# Patient Record
Sex: Female | Born: 1955 | Race: White | Hispanic: No | Marital: Married | Smoking: Former smoker
Health system: Southern US, Community
[De-identification: ages and names within clinical notes are randomized; demographics above are authoritative.]

## PROBLEM LIST (undated history)

## (undated) DIAGNOSIS — M797 Fibromyalgia: Secondary | ICD-10-CM

## (undated) DIAGNOSIS — D649 Anemia, unspecified: Secondary | ICD-10-CM

## (undated) DIAGNOSIS — K589 Irritable bowel syndrome without diarrhea: Secondary | ICD-10-CM

## (undated) DIAGNOSIS — I739 Peripheral vascular disease, unspecified: Secondary | ICD-10-CM

## (undated) DIAGNOSIS — Z9889 Other specified postprocedural states: Secondary | ICD-10-CM

## (undated) DIAGNOSIS — K529 Noninfective gastroenteritis and colitis, unspecified: Secondary | ICD-10-CM

## (undated) DIAGNOSIS — K5090 Crohn's disease, unspecified, without complications: Secondary | ICD-10-CM

## (undated) DIAGNOSIS — M25559 Pain in unspecified hip: Secondary | ICD-10-CM

## (undated) DIAGNOSIS — R109 Unspecified abdominal pain: Secondary | ICD-10-CM

## (undated) DIAGNOSIS — R195 Other fecal abnormalities: Secondary | ICD-10-CM

## (undated) DIAGNOSIS — E039 Hypothyroidism, unspecified: Secondary | ICD-10-CM

## (undated) DIAGNOSIS — IMO0002 Reserved for concepts with insufficient information to code with codable children: Secondary | ICD-10-CM

## (undated) DIAGNOSIS — M2550 Pain in unspecified joint: Secondary | ICD-10-CM

## (undated) DIAGNOSIS — M549 Dorsalgia, unspecified: Secondary | ICD-10-CM

## (undated) DIAGNOSIS — E119 Type 2 diabetes mellitus without complications: Secondary | ICD-10-CM

## (undated) DIAGNOSIS — E785 Hyperlipidemia, unspecified: Secondary | ICD-10-CM

## (undated) DIAGNOSIS — J449 Chronic obstructive pulmonary disease, unspecified: Secondary | ICD-10-CM

## (undated) HISTORY — DX: Pain in unspecified joint: M25.50

## (undated) HISTORY — DX: Pain in unspecified hip: M25.559

## (undated) HISTORY — DX: Unspecified abdominal pain: R10.9

## (undated) HISTORY — DX: Crohn's disease, unspecified, without complications: K50.90

## (undated) HISTORY — DX: Dorsalgia, unspecified: M54.9

## (undated) HISTORY — DX: Other fecal abnormalities: R19.5

## (undated) HISTORY — DX: Hyperlipidemia, unspecified: E78.5

## (undated) HISTORY — DX: Noninfective gastroenteritis and colitis, unspecified: K52.9

## (undated) HISTORY — DX: Other specified postprocedural states: Z98.890

## (undated) HISTORY — DX: Chronic obstructive pulmonary disease, unspecified: J44.9

## (undated) HISTORY — PX: TONSILLECTOMY: SUR1361

## (undated) HISTORY — DX: Fibromyalgia: M79.7

## (undated) HISTORY — PX: SPINE SURGERY: SHX786

## (undated) HISTORY — DX: Irritable bowel syndrome, unspecified: K58.9

## (undated) HISTORY — DX: Anemia, unspecified: D64.9

## (undated) HISTORY — DX: Reserved for concepts with insufficient information to code with codable children: IMO0002

## (undated) HISTORY — DX: Peripheral vascular disease, unspecified: I73.9

---

## 1991-01-04 HISTORY — PX: CHOLECYSTECTOMY: SHX55

## 2000-07-09 ENCOUNTER — Inpatient Hospital Stay (HOSPITAL_COMMUNITY): Admission: AD | Admit: 2000-07-09 | Discharge: 2000-07-11 | Payer: Self-pay | Admitting: Internal Medicine

## 2005-10-13 ENCOUNTER — Ambulatory Visit: Payer: Self-pay | Admitting: Cardiology

## 2007-08-09 ENCOUNTER — Ambulatory Visit: Payer: Self-pay | Admitting: Cardiology

## 2008-02-27 ENCOUNTER — Emergency Department (HOSPITAL_COMMUNITY): Admission: EM | Admit: 2008-02-27 | Discharge: 2008-02-27 | Payer: Self-pay | Admitting: Emergency Medicine

## 2008-09-25 ENCOUNTER — Inpatient Hospital Stay (HOSPITAL_COMMUNITY): Admission: RE | Admit: 2008-09-25 | Discharge: 2008-09-27 | Payer: Self-pay | Admitting: Neurosurgery

## 2009-04-17 ENCOUNTER — Encounter: Admission: RE | Admit: 2009-04-17 | Discharge: 2009-04-17 | Payer: Self-pay | Admitting: Neurosurgery

## 2009-04-30 ENCOUNTER — Inpatient Hospital Stay (HOSPITAL_COMMUNITY): Admission: RE | Admit: 2009-04-30 | Discharge: 2009-05-05 | Payer: Self-pay | Admitting: Neurosurgery

## 2009-09-18 ENCOUNTER — Encounter: Admission: RE | Admit: 2009-09-18 | Discharge: 2009-09-18 | Payer: Self-pay | Admitting: Neurosurgery

## 2010-03-23 LAB — CBC
HCT: 34.8 % — ABNORMAL LOW (ref 36.0–46.0)
Hemoglobin: 12.4 g/dL (ref 12.0–15.0)
MCHC: 35.5 g/dL (ref 30.0–36.0)
MCV: 90.1 fL (ref 78.0–100.0)
Platelets: 205 10*3/uL (ref 150–400)
RBC: 3.87 MIL/uL (ref 3.87–5.11)
RDW: 14 % (ref 11.5–15.5)
WBC: 8.4 10*3/uL (ref 4.0–10.5)

## 2010-03-23 LAB — TYPE AND SCREEN
ABO/RH(D): A POS
Antibody Screen: NEGATIVE

## 2010-03-23 LAB — SURGICAL PCR SCREEN
MRSA, PCR: NEGATIVE
Staphylococcus aureus: POSITIVE — AB

## 2010-04-09 LAB — CBC
HCT: 34.8 % — ABNORMAL LOW (ref 36.0–46.0)
Hemoglobin: 12 g/dL (ref 12.0–15.0)
MCHC: 34.3 g/dL (ref 30.0–36.0)
MCV: 92.9 fL (ref 78.0–100.0)
Platelets: 172 10*3/uL (ref 150–400)
RBC: 3.75 MIL/uL — ABNORMAL LOW (ref 3.87–5.11)
RDW: 12.7 % (ref 11.5–15.5)
WBC: 5.8 10*3/uL (ref 4.0–10.5)

## 2010-04-09 LAB — TYPE AND SCREEN
ABO/RH(D): A POS
Antibody Screen: NEGATIVE

## 2010-04-09 LAB — ABO/RH: ABO/RH(D): A POS

## 2010-05-21 NOTE — Discharge Summary (Signed)
[place]. [facility]  Patient:    Desiree Hogan, Desiree Hogan                       MRN: [id number] Adm. Date:  [id number] Disc. Date: [id number] Attending:  Nikki Dom Dictator:   Arn Medal, P.A. CC:         Dr. Sherrie Sport, [address], [place],  [address]   Discharge Summary  DISCHARGE DIAGNOSES: 1. Chest pain, status post cardiac catheterization revealing nonobstructive    coronary artery disease. 2. History of seasonal allergies. 3. History of headaches. 4. Hyperlipidemia. 5. Tobacco abuse.  HISTORY OF PRESENT ILLNESS:  Desiree Hogan is a 55 year old female who is admitted to [facility] on July 5, for chest pain and left arm pain.  While there, cardiac enzymes were felt to be elevated and she was transported to [facility] for further evaluation.  She was seen and admitted by Dr. Minus Breeding.  He felt that her chest pain was mixed typical and atypical features.  However, with her risk factors and the fact that the pain occurred at rest and was relieved with nitroglycerin, he felt the best course of action was to take the patient to the catheterization lab the next day.  HOSPITAL COURSE:  On the morning of July 8, the patient was seen by Dr. Wyatt Haste.  The patient admitted some intermittent chest pressure as before. Cardiac enzymes were negative.  D-dimer was also negative at 0.28.  Later that day, she was taken to the catheterization lab by Dr. Cristopher Peru. Catheterization results as follows:  Left main coronary artery was free of disease; left anterior descending coronary artery itself was free of disease, however, there was a 40% proximal narrowing in the first diagonal; circumflex system was free of disease; the right coronary artery was noted to be free of disease, however, during catheterization there was a catheter spasm in the proximal portion of the vessel; left ventricle had ejection fraction of 58% with 1+ mitral  regurgitation.  The next day, the patient was seen by Dr. Lattie Haw.  She denied any further chest pain or shortness of breath.  He felt that the patient was stable for discharge with the addition of Norvasc 5 mg a day for regimen.  Dr. Lattie Haw was also somewhat concerned by the patients use of birth control pills as well as smoking given her age and mild obesity.  This matter was discussed with the patient and she was instructed to follow up with Dr. Sherrie Sport in the near future.  DISCHARGE MEDICATIONS: 1. Zyrtec 10 mg q.d. 2. Unknown birth control pills, previously taken. 3. Norvasc 5 mg q.d.  SPECIAL INSTRUCTIONS:  The patient was advised to avoid driving, heavy lifting or tub baths x 48 hours.  She was advised she may return to work on Thursday, July 11.  She was instructed to watch her catheterization site for any pain, bleeding or swelling and to call the [place] office for any of these problems. She is instructed to stop smoking.  DIET:  Low fat, low cholesterol diet.  FOLLOWUP:  She is instructed to follow up with Dr. Sherrie Sport in the near future.  LABORATORY DATA AND X-RAY FINDINGS:  Sodium 141, potassium 4.0, chloride 103, CO2 29, BUN 6, creatinine 0.9, glucose 99.  Serum pregnancy test was negative. White count 8.0, hemoglobin 12.6, hematocrit 36.6, platelets 210.  Total cholesterol 208, triglycerides 283, HDL 39, LDL 112.  TSH 1.615.  Electrocardiogram revealed normal sinus rhythm at approximately 73.  PR interval is 0.166, QRS 0.080, QTC 0.425, axis 12. DD:  07/11/00 TD:  07/11/00 Job: [id number]

## 2010-05-21 NOTE — Cardiovascular Report (Signed)
[place]. [facility]  Patient:    Desiree, Hogan                       MRN: [id number] Proc. Date: 07/10/00 Adm. Date:  [id number] Attending:  Nikki Dom CC:         Minus Breeding, M.D. [facility]   Cardiac Catheterization  PROCEDURE:  Left heart catheterization with coronary angiography and left ventriculography.  INDICATION:  Recurrent substernal chest pain with borderline positive CKs and typical symptoms.  INTRODUCTION:  The patient is a very pleasant 55 year old woman with a history of tobacco abuse and hypercholesterolemia, who was admitted to the hospital with left arm and chest pain for several weeks.  Her symptoms were initially 8/10, associated with shortness of breath and improved with nitroglycerin. She subsequently had borderline positive cardiac enzymes and is now referred for left heart catheterization.  DESCRIPTION OF PROCEDURE:  After informed consent was obtained, the patient was taken to the diagnostic catheterization lab in the fasting state.  After the usual preparation and draping, intravenous midazolam was given for sedation.  Lidocaine 20 cc was infiltrated into the right groin region and a 7 [place] hemostatic sheath inserted percutaneously into the right femoral artery.  The 6 French Judkins catheter was advanced to the left main coronary artery, and coronary angiography of the left main system was then carried out. The left Judkins catheter was removed and the right Judkins catheter was inserted into the right coronary artery, and coronary angiography of the right coronary system was carried out.  The right Judkins catheter was removed and the pigtail catheter was inserted retrograde across the aortic valve into the left ventricle, and left ventriculography in the RAO projection with a total of 30 cc of contrast delivered at a rate of 12 cc per second was performed. The catheter was then removed, hemostasis assured, and the  patient returned to her room in satisfactory condition.  COMPLICATIONS:  There were no immediate procedure complications.  RESULTS:  A. HEMODYNAMICS:  The LV pressure was 108/14, and the aortic pressure was    115/69.  B. LEFT VENTRICULOGRAPHY:  Left ventriculography performed in the RAO    projection with a total of 30 cc of contrast delivered over 2-1/2 seconds    demonstrated an ejection fraction quantitated at 58%.  There was 1+ mitral    regurgitation.  C. CORONARY ANGIOGRAPHY:  The left main coronary artery gave rise to the LAD    and the left circumflex artery.  The first diagonal branch had a 30-40%    narrowing in the proximal portion.  The second diagonal was a small vessel    but had no obstructive disease.  The mid- and distal LAD had no obstructive    lesions.  The left circumflex gave rise to three small obtuse marginal    branches.  There was no obstructive disease in these small vessels.  The    right coronary artery was a dominant vessel.  After the initial injection,    there was catheter-induced spasm in the proximal portion.  The PDA and mid-    and distal RCA had no obstructive disease.  The posterolateral branches    also had no obstructive disease.  CONCLUSION:  This study demonstrates preserved LV systolic function and normal LV end-diastolic pressures.  It demonstrates nonobstructive coronary disease with diffuse tapering of the obtuse marginal branches and a 40% proximal first diagonal branch.  There was 1%  mitral regurgitation noted. DD:  07/10/00 TD:  07/11/00 Job: [id number]

## 2011-10-27 ENCOUNTER — Other Ambulatory Visit: Payer: Self-pay | Admitting: Neurosurgery

## 2011-10-27 DIAGNOSIS — IMO0002 Reserved for concepts with insufficient information to code with codable children: Secondary | ICD-10-CM

## 2011-10-27 DIAGNOSIS — M47817 Spondylosis without myelopathy or radiculopathy, lumbosacral region: Secondary | ICD-10-CM

## 2011-10-27 DIAGNOSIS — M48061 Spinal stenosis, lumbar region without neurogenic claudication: Secondary | ICD-10-CM

## 2011-11-04 ENCOUNTER — Ambulatory Visit
Admission: RE | Admit: 2011-11-04 | Discharge: 2011-11-04 | Disposition: A | Discharge status: Home / Self Care | Payer: BC Managed Care – PPO | Source: Ambulatory Visit | Attending: Neurosurgery | Admitting: Neurosurgery

## 2011-11-04 DIAGNOSIS — IMO0002 Reserved for concepts with insufficient information to code with codable children: Secondary | ICD-10-CM

## 2011-11-04 DIAGNOSIS — M48061 Spinal stenosis, lumbar region without neurogenic claudication: Secondary | ICD-10-CM

## 2011-11-04 DIAGNOSIS — M47817 Spondylosis without myelopathy or radiculopathy, lumbosacral region: Secondary | ICD-10-CM

## 2012-02-03 ENCOUNTER — Encounter: Payer: Self-pay | Admitting: Internal Medicine

## 2012-02-03 HISTORY — PX: COLONOSCOPY: SHX174

## 2012-03-29 DIAGNOSIS — K5090 Crohn's disease, unspecified, without complications: Secondary | ICD-10-CM

## 2012-03-29 HISTORY — DX: Crohn's disease, unspecified, without complications: K50.90

## 2012-04-02 ENCOUNTER — Encounter: Payer: Self-pay | Admitting: Vascular Surgery

## 2012-04-23 ENCOUNTER — Encounter: Payer: Self-pay | Admitting: Vascular Surgery

## 2012-04-24 ENCOUNTER — Encounter: Payer: Self-pay | Admitting: Vascular Surgery

## 2012-04-24 ENCOUNTER — Ambulatory Visit (INDEPENDENT_AMBULATORY_CARE_PROVIDER_SITE_OTHER): Payer: Medicare Other | Admitting: Vascular Surgery

## 2012-04-24 VITALS — BP 140/73 | HR 105 | Resp 18 | Ht 64.5 in | Wt 202.0 lb

## 2012-04-24 DIAGNOSIS — K551 Chronic vascular disorders of intestine: Secondary | ICD-10-CM

## 2012-04-24 NOTE — Progress Notes (Signed)
[facility] [place]   Patient name: Desiree Hogan MRN: [id number] DOB: 1955-02-06 Sex: female   Referred by: Dr Britta Mccreedy  Reason for referral:  Chief Complaint  Patient presents with  . New Evaluation    C/O Narrowing of SMA  Ref. by Dr. Britta Mccreedy - Pt. has lower back pain, Left Hip, thigh and leg pain, duration 4 years.    HISTORY OF PRESENT ILLNESS: The patient presents today for discussing regarding CT angiogram finding of stenosis. Mesenteric artery. She is a very complex past history. She reports that over the past 9 months three-year she has had diffuse abdominal pain. The presumptive diagnosis is of Crohn's disease. She's had upper and lower endoscopies. There is no biopsy-proven diagnosis of Crohn's. 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 I have this CT for review and discuss it with the patient and her husband present. The patient reports that she has nausea. She reports chronic sores in her mouth. She relates to chronic nausea but it is not hungry. She denies any relationship of her abdominal discomfort with eating meals. She reports this is mostly lower abdominal discomfort. She specifically denies any food fear. She had been losing some weight but has actually gained weight 202 pounds now E. related to her prednisone treatment. She denies any history of coronary artery disease. Does have a history of degenerative disc disease having undergone back surgery 2010 and 2011. She continues to have ongoing pain in her back extending into her legs.  Past Medical History  Diagnosis Date  . Colitis   . Hyperlipidemia   . Hip pain   . Ulcer     Mouth  . Back pain   . Joint pain   . COPD (chronic obstructive pulmonary disease)   . Fibromyalgia   . H/O breast biopsy   . Abdominal pain   . Crohn's disease 03/29/12  . Irritable bowel syndrome   . Nonspecific abnormal  finding in stool contents   . Anemia   . Peripheral vascular disease     Past Surgical History  Procedure Laterality Date  . Colonoscopy  Jan. 31, 2014  . Cesarean section  06/24/82  . Tonsillectomy    . Cholecystectomy  1993    Gall Bladder  . Spine surgery  2010 and 2011    History   Social History  . Marital Status: Married    Spouse Name: N/A    Number of Children: N/A  . Years of Education: N/A   Occupational History  . Not on file.   Social History Main Topics  . Smoking status: Former Smoker    Quit date: 01/03/2009  . Smokeless tobacco: Never Used  . Alcohol Use: No  . Drug Use: No  . Sexually Active: Not on file   Other Topics Concern  . Not on file   Social History Narrative  . No narrative on file    Family History  Problem Relation Age of Onset  . Cancer Mother     Colon or vaginal ?  . Deep vein thrombosis Mother   . Hypertension Father   . Diabetes Father   . Heart disease Father     Heart Disease before age 32  . Diabetes Sister   . Diabetes Brother   . Hypertension Brother     Allergies as of 04/24/2012 - Review Complete 04/24/2012  Allergen Reaction Noted  .  Bactrim (sulfamethoxazole w-trimethoprim) Itching 04/02/2012  . Penicillins Anaphylaxis 04/02/2012  . Tetracyclines & related Swelling and Rash 04/24/2012    Current Outpatient Prescriptions on File Prior to Visit  Medication Sig Dispense Refill  . alendronate (FOSAMAX) 70 MG tablet Take 70 mg by mouth every 7 (seven) days. Take with a full glass of water on an empty stomach.      . calcium gluconate 500 MG tablet Take 600 mg by mouth daily.       . cetirizine (ZYRTEC) 10 MG tablet Take 10 mg by mouth daily.      . cyclobenzaprine (FLEXERIL) 10 MG tablet Take 10 mg by mouth 3 (three) times daily as needed for muscle spasms.      . DULoxetine (CYMBALTA) 60 MG capsule Take 60 mg by mouth daily.      Marland Kitchen gabapentin (NEURONTIN) 300 MG capsule Take 300 mg by mouth 3 (three) times  daily.      Marland Kitchen gemfibrozil (LOPID) 600 MG tablet Take 600 mg by mouth 2 (two) times daily before a meal.      . levothyroxine (SYNTHROID, LEVOTHROID) 137 MCG tablet Take 137 mcg by mouth daily.      . Mesalamine (ASACOL HD) 800 MG TBEC Take by mouth.      . predniSONE (DELTASONE) 10 MG tablet Take 10 mg by mouth daily.      . traMADol (ULTRAM) 50 MG tablet Take 50 mg by mouth every 6 (six) hours as needed for pain.      Marland Kitchen PSEUDOEPH-HYDROCODONE PO Take 3-15 mg by mouth 4 (four) times daily.       No current facility-administered medications on file prior to visit.     REVIEW OF SYSTEMS:  Positives indicated with an "X"  CARDIOVASCULAR:  [ ]  chest pain   [ ]  chest pressure   [ ]  palpitations   [ ]  orthopnea   [ ]  dyspnea on exertion   [ ]  claudication   [ ]  rest pain   [ ]  DVT   [ ]  phlebitis PULMONARY:   [ ]  productive cough   [ ]  asthma   [ ]  wheezing NEUROLOGIC:   [ ]  weakness  [ ]  paresthesias  [ ]  aphasia  [ ]  amaurosis  [ ]  dizziness HEMATOLOGIC:   [ ]  bleeding problems   [ ]  clotting disorders MUSCULOSKELETAL:  [ ]  joint pain   [ ]  joint swelling GASTROINTESTINAL: [ ]   blood in stool  [ ]   hematemesis GENITOURINARY:  [ ]   dysuria  [ ]   hematuria PSYCHIATRIC:  [ ]  history of major depression INTEGUMENTARY:  [ ]  rashes  [ ]  ulcers CONSTITUTIONAL:  [ ]  fever   [ ]  chills  PHYSICAL EXAMINATION:  General: The patient is a well-nourished female, in no acute distress. Vital signs are BP 140/73  Pulse 105  Resp 18  Ht 5' 4.5" (1.638 m)  Wt 202 lb (91.627 kg)  BMI 34.15 kg/m2  SpO2 96% Pulmonary: There is a good air exchange bilaterally without wheezing or rales. Abdomen: Soft and non-tender with normal pitch bowel sounds. No bruit is detected Musculoskeletal: There are no major deformities.  There is no significant extremity pain. Neurologic: No focal weakness or paresthesias are detected, Skin: There are no ulcer or rashes noted. Psychiatric: The patient has normal  affect. Cardiovascular: There is a regular rate and rhythm without significant murmur appreciated. Pulse status 2+ radial pulses 2+ dorsalis pedis pulses  CT scan from [facility]  was reviewed. Also reviewed her films from CT scan in 2010. The 2010 films showed no evidence of narrowing of her superior mesenteric artery. 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 Had a very long discussion with the patient and her husband present. I explained that it is impossible to determine if this is causing any of her symptoms. I think is very well may be an incidental finding of asymptomatic. I explained that she does have good collateralization from the celiac and inferior mesenteric artery. It is interesting that she did not have any stenosis in 2010 and has a significant stenosis 4 years later and her 2014 CT scan. She does not have any other evidence of atherosclerotic disease. I explained that typically the single-vessel stenosis would not cause symptoms of mesenteric ischemia. Also of note she specifically denies to be any relationship with food intake which would be an usual if this was pain related to mesenteric ischemia which is typically postprandial. I do feel that she would be acceptable anatomic candidate for stenting if treatment was recommended. I feel this is unlikely that this is causing her symptoms explain that the only way to know would be to treat this was sent MCG and symptom improvement. I feel this would be unlikely. The patient will continue her discussion with Dr. Britta Mccreedy. If no other treatments are effective would consider stenting of her stricture mesenteric artery.    Steen Bisig [facility] [place] Office: [phone]

## 2015-02-02 DIAGNOSIS — E119 Type 2 diabetes mellitus without complications: Secondary | ICD-10-CM | POA: Diagnosis not present

## 2015-02-02 DIAGNOSIS — M545 Low back pain: Secondary | ICD-10-CM | POA: Diagnosis not present

## 2015-05-08 DIAGNOSIS — J069 Acute upper respiratory infection, unspecified: Secondary | ICD-10-CM | POA: Diagnosis not present

## 2015-05-08 DIAGNOSIS — E119 Type 2 diabetes mellitus without complications: Secondary | ICD-10-CM | POA: Diagnosis not present

## 2015-06-23 DIAGNOSIS — M79642 Pain in left hand: Secondary | ICD-10-CM | POA: Diagnosis not present

## 2015-06-23 DIAGNOSIS — R5381 Other malaise: Secondary | ICD-10-CM | POA: Diagnosis not present

## 2015-06-23 DIAGNOSIS — M174 Other bilateral secondary osteoarthritis of knee: Secondary | ICD-10-CM | POA: Diagnosis not present

## 2015-06-23 DIAGNOSIS — M797 Fibromyalgia: Secondary | ICD-10-CM | POA: Diagnosis not present

## 2015-06-30 DIAGNOSIS — M79642 Pain in left hand: Secondary | ICD-10-CM | POA: Diagnosis not present

## 2015-06-30 DIAGNOSIS — M5137 Other intervertebral disc degeneration, lumbosacral region: Secondary | ICD-10-CM | POA: Diagnosis not present

## 2015-06-30 DIAGNOSIS — G4709 Other insomnia: Secondary | ICD-10-CM | POA: Diagnosis not present

## 2015-06-30 DIAGNOSIS — M797 Fibromyalgia: Secondary | ICD-10-CM | POA: Diagnosis not present

## 2015-07-13 DIAGNOSIS — Z5181 Encounter for therapeutic drug level monitoring: Secondary | ICD-10-CM | POA: Diagnosis not present

## 2015-10-30 DIAGNOSIS — J329 Chronic sinusitis, unspecified: Secondary | ICD-10-CM | POA: Diagnosis not present

## 2015-10-30 DIAGNOSIS — Z1389 Encounter for screening for other disorder: Secondary | ICD-10-CM | POA: Diagnosis not present

## 2015-10-30 DIAGNOSIS — R5383 Other fatigue: Secondary | ICD-10-CM | POA: Diagnosis not present

## 2015-10-30 DIAGNOSIS — Z0000 Encounter for general adult medical examination without abnormal findings: Secondary | ICD-10-CM | POA: Diagnosis not present

## 2015-10-30 DIAGNOSIS — Z299 Encounter for prophylactic measures, unspecified: Secondary | ICD-10-CM | POA: Diagnosis not present

## 2015-10-30 DIAGNOSIS — Z6834 Body mass index (BMI) 34.0-34.9, adult: Secondary | ICD-10-CM | POA: Diagnosis not present

## 2015-10-30 DIAGNOSIS — E1165 Type 2 diabetes mellitus with hyperglycemia: Secondary | ICD-10-CM | POA: Diagnosis not present

## 2015-10-30 DIAGNOSIS — Z1211 Encounter for screening for malignant neoplasm of colon: Secondary | ICD-10-CM | POA: Diagnosis not present

## 2015-11-03 DIAGNOSIS — R5383 Other fatigue: Secondary | ICD-10-CM | POA: Diagnosis not present

## 2015-11-03 DIAGNOSIS — Z0000 Encounter for general adult medical examination without abnormal findings: Secondary | ICD-10-CM | POA: Diagnosis not present

## 2015-11-03 DIAGNOSIS — D509 Iron deficiency anemia, unspecified: Secondary | ICD-10-CM | POA: Diagnosis not present

## 2015-11-03 DIAGNOSIS — Z79899 Other long term (current) drug therapy: Secondary | ICD-10-CM | POA: Diagnosis not present

## 2015-12-09 NOTE — Progress Notes (Signed)
Office Visit Note  Patient: Desiree Hogan             Date of Birth: 11/20/1955           MRN: [id number]             PCP: Glenda Chroman, MD Referring: Glenda Chroman, MD Visit Date: 12/11/2015 Occupation: @GUAROCC @    Subjective:  No chief complaint on file. Follow-up on fibromyalgia, insomnia, fatigue.  History of Present Illness: Desiree Hogan is a 61 y.o. female  Last seen 06/30/2015. Patient rates her fiber myalgia discomfort as 78 on a scale of 0-10. Her fatigue is rated 8 on a scale of 0-10 Patient uses tramadol for pain management. Her urine drug screen was negative as of July 2017  She usually sees a back doctor who is been prescribing her Flexeril and gabapentin. She is no longer seeing them and wants [place] to take over. I offered her that I would be happy to give her a muscle relaxer but it won't be Flexeril, it will be Zanaflex instead since she is on tramadol so there is no interaction. She understands and she is agreeable with Zanaflex. She will have to get the gabapentin from her PCP and she is agreeable.    Activities of Daily Living:  Patient reports morning stiffness for 30 minutes.   Patient Reports nocturnal pain.  Difficulty dressing/grooming: Denies Difficulty climbing stairs: Denies Difficulty getting out of chair: Denies Difficulty using hands for taps, buttons, cutlery, and/or writing: Reports   No Rheumatology ROS completed.   PMFS History:  There are no active problems to display for this patient.   Past Medical History:  Diagnosis Date  . Abdominal pain   . Anemia   . Back pain   . Colitis   . COPD (chronic obstructive pulmonary disease) ([place])   . Crohn's disease ([place]) 03/29/12  . Fibromyalgia   . H/O breast biopsy   . Hip pain   . Hyperlipidemia   . Irritable bowel syndrome   . Joint pain   . Nonspecific abnormal finding in stool contents   . Peripheral vascular disease ([place])   . Ulcer ([place])    Mouth    Family History  Problem  Relation Age of Onset  . Cancer Mother     Colon or vaginal ?  . Deep vein thrombosis Mother   . Hypertension Father   . Diabetes Father   . Heart disease Father     Heart Disease before age 40  . Diabetes Sister   . Diabetes Brother   . Hypertension Brother    Past Surgical History:  Procedure Laterality Date  . CESAREAN SECTION  06/24/82  . CHOLECYSTECTOMY  1993   Gall Bladder  . COLONOSCOPY  Jan. 31, 2014  . [place] SURGERY  2010 and 2011  . TONSILLECTOMY     Social History   Social History Narrative  . No narrative on file     Objective: Vital Signs: BP (!) 142/75 (BP Location: Left Arm, Patient Position: Sitting, Cuff Size: Normal)   Pulse 88   Resp 14   Ht 5' 3"  (1.6 m)   Wt 190 lb (86.2 kg)   BMI 33.66 kg/m    Physical Exam   Musculoskeletal Exam:  Full range of motion of all joints Grip strength is equal and strong bilaterally 18 out of 18 fibromyalgia tender points  CDAI Exam: No CDAI exam completed.  No synovitis on examination  Investigation:  Findings:  UDS 07/13/15 consistent with tramadol use narcotic agreement on file Dec 2016     Imaging: No results found.  Speciality Comments: No specialty comments available.    Procedures:  Large Joint Inj Date/Time: 12/11/2015 11:18 AM Performed by: Eliezer Lofts Authorized by: Eliezer Lofts   Consent Given by:  Patient Site marked: the procedure site was marked   Timeout: prior to procedure the correct patient, procedure, and site was verified   Indications:  Pain Location:  Hip Site:  R hip joint Prep: patient was prepped and draped in usual sterile fashion   Needle Size:  27 G Approach:  Superior Ultrasound Guidance: No   Fluoroscopic Guidance: No   Arthrogram: No   Medications:  1.5 mL lidocaine 1 %; 40 mg triamcinolone acetonide 40 MG/ML Aspiration Attempted: Yes   Aspirate amount (mL):  0 Patient tolerance:  Patient tolerated the procedure well with no immediate complications   About 27-06% improvement a few minutes after the injection in each hip Large Joint Inj Date/Time: 12/11/2015 11:28 AM Performed by: Eliezer Lofts Authorized by: Eliezer Lofts   Consent Given by:  Patient Site marked: the procedure site was marked   Timeout: prior to procedure the correct patient, procedure, and site was verified   Indications:  Pain Location:  Hip Site:  L greater trochanter Prep: patient was prepped and draped in usual sterile fashion   Needle Size:  27 G Approach:  Superior Ultrasound Guidance: No   Fluoroscopic Guidance: No   Arthrogram: No   Medications:  1.5 mL lidocaine 1 %; 40 mg triamcinolone acetonide 40 MG/ML Aspiration Attempted: Yes   Aspirate amount (mL):  0 Patient tolerance:  Patient tolerated the procedure well with no immediate complications  About 23-76% improvement a few minutes after the injection in each hip   Allergies: Bactrim [sulfamethoxazole-trimethoprim]; Penicillins; and Tetracyclines & related   Assessment / Plan:     Visit Diagnoses: Fibromyalgia  Greater trochanteric bursitis of both hips - Plan: Large Joint Injection/Arthrocentesis, Large Joint Injection/Arthrocentesis  Chronic fatigue  DDD lumbar   Primary insomnia  Primary osteoarthritis of both knees  Primary osteoarthritis of both hands   Fibromyalgia is about 7-8 on a scale of 0-10 Fatigue is about 8 on a scale of 0-10  Chronic pain, UDS 07/2015 Narcotic agreement 12/2014 Patient is not seeing her back doctor anymore and as a result she wants [place] to start giving her the medications at the back doctor used to give her. I've asked the patient if it is okay for [place] to continue only the Flexeril part of that and let her family physician Joya Gaskins for the gabapentin. Patient is agreeable. And since the patient is actually on tramadol, Flexeril and tramadol will interact with each other and therefore Zanaflex would be a better option and we've asked the patient for if it is  okay to switch her to Zanaflex and patient is agreeable.  I will refill Cymbalta, tramadol, Zanaflex; and she will get the Neurontin from her family physician  Her narcotic agreement is up-to-date and will not be due till about July 2018  Patient is about 25-35% improved after getting cortisone injection in bilateral hips  Return to clinic in 5 months  Urine drug screen is negative as of July 2017 except for positive for tramadol consistent with patient's usage of tramadol.  Orders: Orders Placed This Encounter  Procedures  . Large Joint Injection/Arthrocentesis  . Large Joint Injection/Arthrocentesis   No orders of the defined types  were placed in this encounter.   Face-to-face time spent with patient was 30 minutes. 50% of time was spent in counseling and coordination of care.  Follow-Up Instructions: Return in about 6 months (around 06/10/2016) for [place].   Eliezer Lofts, PA-C   I examined and evaluated the patient with Eliezer Lofts PA. The plan of care was discussed as noted above.  Bo Merino, MD

## 2015-12-11 ENCOUNTER — Ambulatory Visit: Payer: Self-pay | Admitting: Rheumatology

## 2015-12-11 ENCOUNTER — Encounter: Payer: Self-pay | Admitting: Rheumatology

## 2015-12-11 ENCOUNTER — Ambulatory Visit (INDEPENDENT_AMBULATORY_CARE_PROVIDER_SITE_OTHER): Payer: No Typology Code available for payment source | Admitting: Rheumatology

## 2015-12-11 ENCOUNTER — Other Ambulatory Visit: Payer: Self-pay | Admitting: *Deleted

## 2015-12-11 VITALS — BP 142/75 | HR 88 | Resp 14 | Ht 63.0 in | Wt 190.0 lb

## 2015-12-11 DIAGNOSIS — M7062 Trochanteric bursitis, left hip: Secondary | ICD-10-CM

## 2015-12-11 DIAGNOSIS — M47816 Spondylosis without myelopathy or radiculopathy, lumbar region: Secondary | ICD-10-CM

## 2015-12-11 DIAGNOSIS — M7061 Trochanteric bursitis, right hip: Secondary | ICD-10-CM | POA: Diagnosis not present

## 2015-12-11 DIAGNOSIS — R5382 Chronic fatigue, unspecified: Secondary | ICD-10-CM

## 2015-12-11 DIAGNOSIS — M19041 Primary osteoarthritis, right hand: Secondary | ICD-10-CM

## 2015-12-11 DIAGNOSIS — F5101 Primary insomnia: Secondary | ICD-10-CM | POA: Diagnosis not present

## 2015-12-11 DIAGNOSIS — M797 Fibromyalgia: Secondary | ICD-10-CM

## 2015-12-11 DIAGNOSIS — M19042 Primary osteoarthritis, left hand: Secondary | ICD-10-CM

## 2015-12-11 DIAGNOSIS — M170 Bilateral primary osteoarthritis of knee: Secondary | ICD-10-CM | POA: Diagnosis not present

## 2015-12-11 MED ORDER — DULOXETINE HCL 60 MG PO CPEP: 60.0000 mg | ORAL_CAPSULE | Freq: Every day | ORAL | 1 refills | Status: DC

## 2015-12-11 MED ORDER — TIZANIDINE HCL 4 MG PO TABS: 4.0000 mg | ORAL_TABLET | Freq: Every day | ORAL | 0 refills | Status: DC

## 2015-12-11 MED ORDER — TRIAMCINOLONE ACETONIDE 40 MG/ML IJ SUSP
40.0000 mg | INTRAMUSCULAR | Status: AC | PRN
  Administered 2015-12-11: 40 mg via INTRA_ARTICULAR

## 2015-12-11 MED ORDER — TRAMADOL HCL 50 MG PO TABS: 50.0000 mg | ORAL_TABLET | Freq: Four times a day (QID) | ORAL | 2 refills | Status: DC | PRN

## 2015-12-11 MED ORDER — LIDOCAINE HCL 1 % IJ SOLN
1.5000 mL | INTRAMUSCULAR | Status: AC | PRN
  Administered 2015-12-11: 1.5 mL

## 2016-01-04 ENCOUNTER — Other Ambulatory Visit: Payer: Self-pay | Admitting: Rheumatology

## 2016-01-05 NOTE — Telephone Encounter (Signed)
Okay 

## 2016-01-05 NOTE — Telephone Encounter (Signed)
UDS 07/16/15 Last visit 12/11/15 Next visit 06/10/16 12/26/14 narcotic agreement (mailed to patient to update) Ok to refill Tramadol ?

## 2016-01-08 ENCOUNTER — Other Ambulatory Visit: Payer: Self-pay | Admitting: *Deleted

## 2016-01-08 ENCOUNTER — Other Ambulatory Visit: Payer: Self-pay | Admitting: Rheumatology

## 2016-01-08 NOTE — Telephone Encounter (Signed)
Last Visit: 12/11/15 Next Visit: 06/10/16 UDS: 07/16/15 Narc Agreement: 12/26/14  Will call the patient for her to update her Narcotic agreement.   Okay to refill Tramadol?

## 2016-01-08 NOTE — Telephone Encounter (Signed)
Last refill was September 2017. She takes 1 tablet at noon and 2 tablets at night prn, Dispense 30 days supply (90 pills) with 2 refills

## 2016-01-08 NOTE — Telephone Encounter (Signed)
As you already mentioned, patient needs an updated narcotic agreement.Okay to refill tramadol

## 2016-02-01 ENCOUNTER — Other Ambulatory Visit: Payer: Self-pay | Admitting: Rheumatology

## 2016-02-01 NOTE — Telephone Encounter (Signed)
Patient needs a refill of Tramadol sent to [facility] in [place].

## 2016-02-01 NOTE — Telephone Encounter (Signed)
Last Visit: 12/11/15 Next Visit: 06/10/16 UDS: 07/16/15 Narc Agreement: 12/26/14  Okay to refill Tramadol?

## 2016-02-01 NOTE — Telephone Encounter (Signed)
According to patient's chart there was a prescription for Tramadol sent to the patient's pharmacy on 01/08/16. Patient to contact her pharmacy to see if they have it on file. Will call back if she has any problems.

## 2016-02-01 NOTE — Telephone Encounter (Signed)
Attempted to contact the patient and left message for patient to call the office.  

## 2016-02-09 DIAGNOSIS — M810 Age-related osteoporosis without current pathological fracture: Secondary | ICD-10-CM | POA: Diagnosis not present

## 2016-02-09 DIAGNOSIS — K5090 Crohn's disease, unspecified, without complications: Secondary | ICD-10-CM | POA: Diagnosis not present

## 2016-02-09 DIAGNOSIS — Z87891 Personal history of nicotine dependence: Secondary | ICD-10-CM | POA: Diagnosis not present

## 2016-02-09 DIAGNOSIS — Z713 Dietary counseling and surveillance: Secondary | ICD-10-CM | POA: Diagnosis not present

## 2016-02-09 DIAGNOSIS — Z299 Encounter for prophylactic measures, unspecified: Secondary | ICD-10-CM | POA: Diagnosis not present

## 2016-02-09 DIAGNOSIS — D509 Iron deficiency anemia, unspecified: Secondary | ICD-10-CM | POA: Diagnosis not present

## 2016-02-09 DIAGNOSIS — E7800 Pure hypercholesterolemia, unspecified: Secondary | ICD-10-CM | POA: Diagnosis not present

## 2016-02-09 DIAGNOSIS — J069 Acute upper respiratory infection, unspecified: Secondary | ICD-10-CM | POA: Diagnosis not present

## 2016-02-09 DIAGNOSIS — E1165 Type 2 diabetes mellitus with hyperglycemia: Secondary | ICD-10-CM | POA: Diagnosis not present

## 2016-02-09 DIAGNOSIS — E039 Hypothyroidism, unspecified: Secondary | ICD-10-CM | POA: Diagnosis not present

## 2016-02-24 ENCOUNTER — Other Ambulatory Visit: Payer: Self-pay | Admitting: Rheumatology

## 2016-02-24 NOTE — Telephone Encounter (Signed)
Patient is requesting refill of tramadol be sent to [facility] in [place].

## 2016-02-25 MED ORDER — TRAMADOL HCL 50 MG PO TABS: 50.0000 mg | ORAL_TABLET | Freq: Four times a day (QID) | ORAL | 0 refills | Status: DC | PRN

## 2016-02-25 NOTE — Telephone Encounter (Signed)
Left message to advise patient prescription has been sent to the pharmacy.

## 2016-02-25 NOTE — Telephone Encounter (Signed)
Last Visit: 12/11/15 Next Visit: 06/10/16 Narc Agreement: 01/12/16 UDS: 07/16/15  Okay to refill Tramadol?

## 2016-02-25 NOTE — Telephone Encounter (Signed)
Patient called and is wanting to know if her tramedol was sent to the pharmacy.  [phone]

## 2016-02-29 DIAGNOSIS — N182 Chronic kidney disease, stage 2 (mild): Secondary | ICD-10-CM | POA: Diagnosis not present

## 2016-02-29 DIAGNOSIS — D509 Iron deficiency anemia, unspecified: Secondary | ICD-10-CM | POA: Diagnosis not present

## 2016-03-06 ENCOUNTER — Other Ambulatory Visit: Payer: Self-pay | Admitting: Rheumatology

## 2016-03-07 DIAGNOSIS — N182 Chronic kidney disease, stage 2 (mild): Secondary | ICD-10-CM | POA: Diagnosis not present

## 2016-03-07 DIAGNOSIS — D509 Iron deficiency anemia, unspecified: Secondary | ICD-10-CM | POA: Diagnosis not present

## 2016-03-07 NOTE — Telephone Encounter (Signed)
Last Visit: 12/11/15 Next Visit: 06/10/16  Okay to refill Tizanidine?

## 2016-03-24 ENCOUNTER — Other Ambulatory Visit: Payer: Self-pay | Admitting: Rheumatology

## 2016-03-24 MED ORDER — TRAMADOL HCL 50 MG PO TABS: 50.0000 mg | ORAL_TABLET | Freq: Four times a day (QID) | ORAL | 0 refills | Status: DC | PRN

## 2016-03-24 NOTE — Telephone Encounter (Signed)
Patient is requesting refill of tramadol to be [facility] [place].

## 2016-03-24 NOTE — Telephone Encounter (Signed)
Last Visit: 12/11/15 Next Visit: 06/10/16 Narc Agreement: 01/12/16 UDS: 07/16/15  Okay to refill Tramadol?

## 2016-04-19 ENCOUNTER — Other Ambulatory Visit: Payer: Self-pay | Admitting: Rheumatology

## 2016-04-19 MED ORDER — TRAMADOL HCL 50 MG PO TABS: 50.0000 mg | ORAL_TABLET | Freq: Four times a day (QID) | ORAL | 0 refills | Status: DC | PRN

## 2016-04-19 NOTE — Telephone Encounter (Signed)
Last Visit: 12/11/15 Next Visit: 06/10/16 Narc Agreement: 01/12/16 UDS: 07/16/15  Okay to refill Tramadol?

## 2016-04-19 NOTE — Telephone Encounter (Signed)
Patient called requesting refill on her Tramadol.  [facility] [place]  CB#[phone].  Thank you.

## 2016-05-13 ENCOUNTER — Other Ambulatory Visit: Payer: Self-pay | Admitting: Rheumatology

## 2016-05-13 DIAGNOSIS — Z6833 Body mass index (BMI) 33.0-33.9, adult: Secondary | ICD-10-CM | POA: Diagnosis not present

## 2016-05-13 DIAGNOSIS — E039 Hypothyroidism, unspecified: Secondary | ICD-10-CM | POA: Diagnosis not present

## 2016-05-13 DIAGNOSIS — K5090 Crohn's disease, unspecified, without complications: Secondary | ICD-10-CM | POA: Diagnosis not present

## 2016-05-13 DIAGNOSIS — Z299 Encounter for prophylactic measures, unspecified: Secondary | ICD-10-CM | POA: Diagnosis not present

## 2016-05-13 DIAGNOSIS — M810 Age-related osteoporosis without current pathological fracture: Secondary | ICD-10-CM | POA: Diagnosis not present

## 2016-05-13 DIAGNOSIS — E7800 Pure hypercholesterolemia, unspecified: Secondary | ICD-10-CM | POA: Diagnosis not present

## 2016-05-13 DIAGNOSIS — E1165 Type 2 diabetes mellitus with hyperglycemia: Secondary | ICD-10-CM | POA: Diagnosis not present

## 2016-05-13 DIAGNOSIS — Z713 Dietary counseling and surveillance: Secondary | ICD-10-CM | POA: Diagnosis not present

## 2016-05-13 MED ORDER — TRAMADOL HCL 50 MG PO TABS: 50.0000 mg | ORAL_TABLET | Freq: Four times a day (QID) | ORAL | 0 refills | Status: DC | PRN

## 2016-05-13 NOTE — Telephone Encounter (Signed)
Last Visit: 12/11/15 Next Visit: 06/10/16 Narc Agreement: 01/12/16 UDS: 07/16/15  Okay to refill Tramadol?

## 2016-05-13 NOTE — Telephone Encounter (Signed)
Patient is requesting ref[facility] of [facility] [place].

## 2016-05-19 DIAGNOSIS — J45909 Unspecified asthma, uncomplicated: Secondary | ICD-10-CM | POA: Diagnosis not present

## 2016-05-19 DIAGNOSIS — J309 Allergic rhinitis, unspecified: Secondary | ICD-10-CM | POA: Diagnosis not present

## 2016-06-01 ENCOUNTER — Other Ambulatory Visit: Payer: Self-pay | Admitting: Rheumatology

## 2016-06-02 NOTE — Telephone Encounter (Signed)
Last Visit: 12/10/16 Next Visit: 06/10/16  Okay to refill Tizanidine?

## 2016-06-06 ENCOUNTER — Other Ambulatory Visit: Payer: Self-pay | Admitting: Rheumatology

## 2016-06-06 MED ORDER — TRAMADOL HCL 50 MG PO TABS
50.0000 mg | ORAL_TABLET | Freq: Four times a day (QID) | ORAL | 0 refills | Status: DC | PRN
Start: 2016-06-06 — End: 2016-06-30

## 2016-06-06 NOTE — Telephone Encounter (Signed)
Last Visit: 12/11/15 Next Visit: 06/10/16 Narc Agreement: 01/12/16 UDS: 07/16/15  Okay to refill Tramadol?

## 2016-06-06 NOTE — Telephone Encounter (Signed)
Patient left a message requesting a refill on her Tramadol.  [facility] [place].  CB#[phone]

## 2016-06-10 ENCOUNTER — Ambulatory Visit: Payer: No Typology Code available for payment source | Admitting: Rheumatology

## 2016-06-10 ENCOUNTER — Encounter: Payer: Self-pay | Admitting: Rheumatology

## 2016-06-10 ENCOUNTER — Ambulatory Visit (INDEPENDENT_AMBULATORY_CARE_PROVIDER_SITE_OTHER): Payer: No Typology Code available for payment source | Admitting: Rheumatology

## 2016-06-10 VITALS — BP 141/69 | HR 85 | Resp 15 | Ht 63.5 in | Wt 190.0 lb

## 2016-06-10 DIAGNOSIS — R5382 Chronic fatigue, unspecified: Secondary | ICD-10-CM | POA: Diagnosis not present

## 2016-06-10 DIAGNOSIS — M797 Fibromyalgia: Secondary | ICD-10-CM

## 2016-06-10 DIAGNOSIS — Z5181 Encounter for therapeutic drug level monitoring: Secondary | ICD-10-CM | POA: Diagnosis not present

## 2016-06-10 DIAGNOSIS — M7061 Trochanteric bursitis, right hip: Secondary | ICD-10-CM | POA: Diagnosis not present

## 2016-06-10 DIAGNOSIS — F5101 Primary insomnia: Secondary | ICD-10-CM | POA: Diagnosis not present

## 2016-06-10 DIAGNOSIS — M62838 Other muscle spasm: Secondary | ICD-10-CM | POA: Diagnosis not present

## 2016-06-10 DIAGNOSIS — M461 Sacroiliitis, not elsewhere classified: Secondary | ICD-10-CM

## 2016-06-10 DIAGNOSIS — M7062 Trochanteric bursitis, left hip: Secondary | ICD-10-CM | POA: Diagnosis not present

## 2016-06-10 DIAGNOSIS — M47818 Spondylosis without myelopathy or radiculopathy, sacral and sacrococcygeal region: Secondary | ICD-10-CM | POA: Diagnosis not present

## 2016-06-10 LAB — CBC WITH DIFFERENTIAL/PLATELET
BASOS ABS: 0 {cells}/uL (ref 0–200)
Basophils Relative: 0 %
EOS ABS: 228 {cells}/uL (ref 15–500)
Eosinophils Relative: 3 %
HEMATOCRIT: 38.5 % (ref 35.0–45.0)
Hemoglobin: 12.6 g/dL (ref 11.7–15.5)
LYMPHS PCT: 28 %
Lymphs Abs: 2128 cells/uL (ref 850–3900)
MCH: 31 pg (ref 27.0–33.0)
MCHC: 32.7 g/dL (ref 32.0–36.0)
MCV: 94.6 fL (ref 80.0–100.0)
MONO ABS: 684 {cells}/uL (ref 200–950)
MONOS PCT: 9 %
MPV: 11.7 fL (ref 7.5–12.5)
Neutro Abs: 4560 cells/uL (ref 1500–7800)
Neutrophils Relative %: 60 %
PLATELETS: 259 10*3/uL (ref 140–400)
RBC: 4.07 MIL/uL (ref 3.80–5.10)
RDW: 13 % (ref 11.0–15.0)
WBC: 7.6 10*3/uL (ref 3.8–10.8)

## 2016-06-10 MED ORDER — LIDOCAINE HCL (PF) 1 % IJ SOLN
0.5000 mL | INTRAMUSCULAR | Status: AC | PRN
  Administered 2016-06-10: .5 mL

## 2016-06-10 MED ORDER — LIDOCAINE HCL (PF) 1 % IJ SOLN
1.5000 mL | INTRAMUSCULAR | Status: AC | PRN
  Administered 2016-06-10: 1.5 mL

## 2016-06-10 MED ORDER — TRIAMCINOLONE ACETONIDE 40 MG/ML IJ SUSP
40.0000 mg | INTRAMUSCULAR | Status: AC | PRN
  Administered 2016-06-10: 40 mg via INTRA_ARTICULAR

## 2016-06-10 MED ORDER — TRIAMCINOLONE ACETONIDE 40 MG/ML IJ SUSP
10.0000 mg | INTRAMUSCULAR | Status: AC | PRN
  Administered 2016-06-10: 10 mg via INTRAMUSCULAR

## 2016-06-10 NOTE — Progress Notes (Signed)
Office Visit Note  Patient: Desiree Hogan             Date of Birth: 1955-11-30           MRN: [id number]             PCP: Glenda Chroman, MD Referring: Glenda Chroman, MD Visit Date: 06/10/2016 Occupation: @GUAROCC @    Subjective:  Fibromyalgia (Follow up)   History of Present Illness: Desiree Hogan is a 61 y.o. female  Last seen in our office 12/11/2015.  She has a history of fibromyalgia insomnia and fatigue. She is doing about the same with those that she did on the previous visit and she rates her discomfort as 7-8 on a scale of 0-10.  Unfortunately, she is having bilateral trapezius muscle pain and right sacroiliac pain and is requesting a cortisone injection there.  She doesn't need any medication refill at this time and will call us when she does.  We have been giving her Zanaflex since it doesn't interfere with her tramadol.  Since her back doctor no longer gives her gabapentin, she's been obtaining it from her PCP.  Activities of Daily Living:  Patient reports morning stiffness for 30 minutes.   Patient Reports nocturnal pain.  Difficulty dressing/grooming: Reports Difficulty climbing stairs: Reports Difficulty getting out of chair: Reports Difficulty using hands for taps, buttons, cutlery, and/or writing: Reports   Review of Systems  Constitutional: Positive for fatigue.  HENT: Negative for mouth sores and mouth dryness.   Eyes: Negative for dryness.  Respiratory: Negative for shortness of breath.   Gastrointestinal: Negative for constipation and diarrhea.  Musculoskeletal: Positive for myalgias and myalgias.  Skin: Negative for sensitivity to sunlight.  Psychiatric/Behavioral: Positive for sleep disturbance. Negative for decreased concentration.    PMFS History:  There are no active problems to display for this patient.   Past Medical History:  Diagnosis Date  . Abdominal pain   . Anemia   . Back pain   . Colitis   . COPD (chronic obstructive  pulmonary disease) ([place])   . Crohn's disease ([place]) 03/29/12  . Fibromyalgia   . H/O breast biopsy   . Hip pain   . Hyperlipidemia   . Irritable bowel syndrome   . Joint pain   . Nonspecific abnormal finding in stool contents   . Peripheral vascular disease ([place])   . Ulcer    Mouth    Family History  Problem Relation Age of Onset  . Cancer Mother        Colon or vaginal ?  . Deep vein thrombosis Mother   . Hypertension Father   . Diabetes Father   . Heart disease Father        Heart Disease before age 92  . Diabetes Sister   . Diabetes Brother   . Hypertension Brother    Past Surgical History:  Procedure Laterality Date  . CESAREAN SECTION  06/24/82  . CHOLECYSTECTOMY  1993   Gall Bladder  . COLONOSCOPY  Jan. 31, 2014  . [place] SURGERY  2010 and 2011  . TONSILLECTOMY     Social History   Social History Narrative  . No narrative on file     Objective: Vital Signs: BP (!) 141/69 (BP Location: Left Arm, Patient Position: Sitting, Cuff Size: Normal)   Pulse 85   Resp 15   Ht 5' 3.5" (1.613 m)   Wt 190 lb (86.2 kg)   BMI 33.13 kg/m  Physical Exam  Constitutional: She is oriented to person, place, and time. She appears well-developed and well-nourished.  HENT:  Head: Normocephalic and atraumatic.  Eyes: EOM are normal. Pupils are equal, round, and reactive to light.  Cardiovascular: Normal rate, regular rhythm and normal heart sounds.  Exam reveals no gallop and no friction rub.   No murmur heard. Pulmonary/Chest: Effort normal and breath sounds normal. She has no wheezes. She has no rales.  Abdominal: Soft. Bowel sounds are normal. She exhibits no distension. There is no tenderness. There is no guarding. No hernia.  Musculoskeletal: Normal range of motion. She exhibits no edema, tenderness or deformity.  Lymphadenopathy:    She has no cervical adenopathy.  Neurological: She is alert and oriented to person, place, and time. Coordination normal.  Skin: Skin is  warm and dry. Capillary refill takes less than 2 seconds. No rash noted.  Psychiatric: She has a normal mood and affect. Her behavior is normal.  Nursing note and vitals reviewed.    Musculoskeletal Exam:  Full range of motion of all joints Grip strength is equal and strong bilaterally Fiber myalgia tender points are 18 out of 18 positive with exquisite tenderness to bilateral trapezius muscles and right sacroiliac joint more so than the left sacroiliac joint. Bilateral greater trochanteric bursa's are also painful.   CDAI Exam: CDAI Homunculus Exam:   Joint Counts:  CDAI Tender Joint count: 0 CDAI Swollen Joint count: 0  Global Assessments:  Patient Global Assessment: 10 Provider Global Assessment: 10  CDAI Calculated Score: 20  No synovitis on examination  Investigation: No additional findings.   No visits with results within 6 Month(s) from this visit.  Latest known visit with results is:  Admission on 04/30/2009, Discharged on 05/05/2009  Component Date Value Ref Range Status  . ABO/RH(D) 04/28/2009 A POS   Final  . Antibody Screen 04/28/2009 NEG   Final  . Sample Expiration 04/28/2009 05/05/2009   Final  . WBC 04/28/2009 8.4  4.0 - 10.5 K/uL Final  . RBC 04/28/2009 3.87  3.87 - 5.11 MIL/uL Final  . Hemoglobin 04/28/2009 12.4  12.0 - 15.0 g/dL Final  . HCT 04/28/2009 34.8* 36.0 - 46.0 % Final  . MCV 04/28/2009 90.1  78.0 - 100.0 fL Final  . MCHC 04/28/2009 35.5  30.0 - 36.0 g/dL Final  . RDW 04/28/2009 14.0  11.5 - 15.5 % Final  . Platelets 04/28/2009 205  150 - 400 K/uL Final  . MRSA, PCR 04/28/2009 NEGATIVE  NEGATIVE Final  . Staphylococcus aureus 04/28/2009 * NEGATIVE Final                   Value:POSITIVE                                The Xpert SA Assay (FDA                         approved for NASAL specimens                         only), is one component of                         a comprehensive surveillance  program.  It is not  intended                         to diagnose infection nor to                         guide or monitor treatment.     Imaging: No results found.  Speciality Comments: No specialty comments available.    Procedures:  Large Joint Inj Date/Time: 06/10/2016 10:50 AM Performed by: Eliezer Lofts Authorized by: Eliezer Lofts   Consent Given by:  Patient Site marked: the procedure site was marked   Timeout: prior to procedure the correct patient, procedure, and site was verified   Indications:  Pain Location:  Sacroiliac Site:  R sacroiliac joint Prep: patient was prepped and draped in usual sterile fashion   Needle Size:  27 G Needle Length:  1.5 inches Approach:  Superior Ultrasound Guidance: No   Fluoroscopic Guidance: No   Arthrogram: No   Medications:  40 mg triamcinolone acetonide 40 MG/ML; 1.5 mL lidocaine (PF) 1 % Aspiration Attempted: Yes   Aspirate amount (mL):  0  PATIENT TOLERATED PROCEDURE WELL. THERE WERE NO COMPLICATIONS.   Trigger Point Inj Date/Time: 06/10/2016 10:51 AM Performed by: Eliezer Lofts Authorized by: Eliezer Lofts   Consent Given by:  Patient Site marked: the procedure site was marked   Timeout: prior to procedure the correct patient, procedure, and site was verified   Indications:  Muscle spasm and pain Total # of Trigger Points:  2 Location: neck   Needle Size:  27 G Approach:  Dorsal Medications #1:  0.5 mL lidocaine (PF) 1 %; 10 mg triamcinolone acetonide 40 MG/ML Medications #2:  0.5 mL lidocaine (PF) 1 %; 10 mg triamcinolone acetonide 40 MG/ML Patient tolerance:  Patient tolerated the procedure well with no immediate complications Comments: Bilateral trapezius muscle injection   Allergies: Bactrim [sulfamethoxazole-trimethoprim]; Penicillins; and Tetracyclines & related   Assessment / Plan:     Visit Diagnoses: Fibromyalgia - Plan: VITAMIN D 25 Hydroxy (Vit-D Deficiency, Fractures), COMPLETE METABOLIC PANEL WITH GFR, CBC with  Differential/Platelet  Chronic fatigue - Plan: VITAMIN D 25 Hydroxy (Vit-D Deficiency, Fractures), COMPLETE METABOLIC PANEL WITH GFR, CBC with Differential/Platelet  Primary insomnia - Plan: VITAMIN D 25 Hydroxy (Vit-D Deficiency, Fractures), COMPLETE METABOLIC PANEL WITH GFR, CBC with Differential/Platelet  Osteoarthritis of sacroiliac region - 06/10/2016: Right sacroiliac joint pain (cortisone injection today)  Trapezius muscle spasm - Cortisone to bilateral trapezius area  Medication monitoring encounter - Plan: VITAMIN D 25 Hydroxy (Vit-D Deficiency, Fractures), COMPLETE METABOLIC PANEL WITH GFR, CBC with Differential/Platelet  Greater trochanteric bursitis of both hips - Plan: Large Joint Injection/Arthrocentesis, Trigger Point Injection   Return to clinic in 5 months for follow-up visit on fibromyalgia.  Labs need to be updated. We will do CBC with differential, CMP with GFR, vitamin D area This is for medication management, fatigue, rule out vitamin D deficiency; patient is 61 years old and postmenopausal and monitoring vitamin D is essential to prevent osteoporosis  Orders: Orders Placed This Encounter  Procedures  . Large Joint Injection/Arthrocentesis  . Trigger Point Injection  . VITAMIN D 25 Hydroxy (Vit-D Deficiency, Fractures)  . COMPLETE METABOLIC PANEL WITH GFR  . CBC with Differential/Platelet   No orders of the defined types were placed in this encounter.   Face-to-face time spent with patient was 30 minutes. 50% of time was spent in counseling and coordination  of care.  Follow-Up Instructions: Return in about 6 months (around 12/10/2016).   Eliezer Lofts, PA-C  Note - This record has been created using [employer].  Chart creation errors have been sought, but may not always  have been located. Such creation errors do not reflect on  the standard of medical care.

## 2016-06-10 NOTE — Patient Instructions (Signed)
Iliotibial Bursitis Rehab Ask your health care provider which exercises are safe for you. Do exercises exactly as told by your health care provider and adjust them as directed. It is normal to feel mild stretching, pulling, tightness, or discomfort as you do these exercises, but you should stop right away if you feel sudden pain or your pain gets worse.Do not begin these exercises until told by your health care provider. Stretching and range of motion exercises These exercises warm up your muscles and joints and improve the movement and flexibility of your leg. These exercises also help to relieve pain and stiffness. Exercise A: Quadriceps stretch, prone  1. Lie on your abdomen on a firm surface, such as a bed or padded floor. 2. Bend your left / right knee and hold your ankle. If you cannot reach your ankle or pant leg, loop a belt around your foot and grab the belt instead. 3. Gently pull your heel toward your buttocks. Your knee should not slide out to the side. You should feel a stretch in the front of your thigh and knee. 4. Hold this position for __________ seconds. Repeat __________ times. Complete this exercise __________ times a day. Exercise B: Lunge ( adductor stretch) 1. Stand and spread your legs about 3 feet (about 1 m) apart. Put your left / right leg slightly back for balance. 2. Lean away from your left / right leg by bending your other knee and shifting your weight toward your bent knee. You may rest your hands on your thigh for balance. You should feel a stretch in your left / right inner thigh. 3. Hold for __________ seconds. Repeat __________ times. Complete this exercise __________ times a day. Exercise C: Hamstring stretch, supine  1. Lie on your back. 2. Hold both ends of a belt or towel as you loop it over the ball of your left / right foot. The ball of your foot is on the walking surface, right under your toes. 3. Straighten your left / right knee and slowly pull on  the belt to raise your leg. Stop when you feel a gentle stretch in the back of your left / right knee or thigh. ? Do not let your left / right knee bend. ? Keep your other leg flat on the floor. 4. Hold this position for __________ seconds. Repeat __________ times. Complete this exercise __________ times a day. Strengthening exercises These exercises build strength and endurance in your leg. Endurance is the ability to use your muscles for a long time, even after they get tired. Exercise D: Quadriceps wall slides  1. Lean your back against a smooth wall or door while you walk your feet out 18-24 inches (46-61 cm) from it. 2. Place your feet hip-width apart. 3. Slowly slide down the wall or door until your knees bend as far as told by your health care provider. Keep your knees over your heels, not your toes. Keep your knees in line with your hips. 4. Hold for __________ seconds. 5. Push through your heels to stand up to rest for __________ seconds after each repetition. Repeat __________ times. Complete this exercise __________ times a day. Exercise E: Straight leg raises ( hip abductors) 1. Lie on your side, with your left / right leg in the top position. Lie so your head, shoulder, knee, and hip line up with each other. You may bend your bottom knee to help you balance. 2. Lift your top leg 4-6 inches (10-15 cm) while keeping your  toes pointed straight ahead. 3. Hold this position for __________ seconds. 4. Slowly lower your leg to the starting position. Allow your muscles to relax completely after each repetition. Repeat __________ times. Complete this exercise __________ times a day. Exercise F: Straight leg raises ( hip extensors) 1. Lie on your abdomen on a firm surface. You can put a pillow under your hips if that is more comfortable. 2. Tense the muscles in your buttocks and lift your left / right leg about 4-6 inches (10-15 cm). Keep your knee straight as you lift your leg. 3. Hold  this position for __________ seconds. 4. Slowly lower your leg to the starting position. 5. Let your leg relax completely after each repetition. Repeat __________ times. Complete this exercise __________ times a day. Exercise G: Bridge ( hip extensors) 1. Lie on your back on a firm surface with your knees bent and your feet flat on the floor. 2. Tighten your buttocks muscles and lift your bottom off the floor until your trunk is level with your thighs. ? Do not arch your back. ? You should feel the muscles working in your buttocks and the back of your thighs. If you do not feel these muscles, slide your feet 1-2 inches (2.5-5 cm) farther away from your buttocks. 3. Hold this position for __________ seconds. 4. Slowly lower your hips to the starting position. 5. Let your buttocks muscles relax completely between repetitions. 6. If this exercise is too easy, try doing it with your arms crossed over your chest. Repeat __________ times. Complete this exercise __________ times a day. This information is not intended to replace advice given to you by your health care provider. Make sure you discuss any questions you have with your health care provider. Document Released: 12/20/2004 Document Revised: 08/27/2015 Document Reviewed: 12/02/2014 Elsevier Interactive Patient Education  [employer].

## 2016-06-11 LAB — COMPLETE METABOLIC PANEL WITH GFR
ALT: 13 U/L (ref 6–29)
AST: 18 U/L (ref 10–35)
Albumin: 4.4 g/dL (ref 3.6–5.1)
Alkaline Phosphatase: 76 U/L (ref 33–130)
BILIRUBIN TOTAL: 0.5 mg/dL (ref 0.2–1.2)
BUN: 14 mg/dL (ref 7–25)
CO2: 24 mmol/L (ref 20–31)
CREATININE: 0.75 mg/dL (ref 0.50–0.99)
Calcium: 9.9 mg/dL (ref 8.6–10.4)
Chloride: 104 mmol/L (ref 98–110)
GFR, Est African American: >89 mL/min (ref >=60)
GFR, Est Non African American: 86 mL/min (ref >=60)
GLUCOSE: 106 mg/dL — AB (ref 65–99)
Potassium: 4.5 mmol/L (ref 3.5–5.3)
SODIUM: 140 mmol/L (ref 135–146)
TOTAL PROTEIN: 7 g/dL (ref 6.1–8.1)

## 2016-06-11 LAB — VITAMIN D 25 HYDROXY (VIT D DEFICIENCY, FRACTURES): Vit D, 25-Hydroxy: 33 ng/mL (ref 30–100)

## 2016-06-30 ENCOUNTER — Other Ambulatory Visit: Payer: Self-pay | Admitting: Rheumatology

## 2016-06-30 NOTE — Telephone Encounter (Signed)
Patient advised prescription was sent to pharmacy at the beginning of June with instruction not to fill until 06/24/16. Patient to call pharmacy and if any problems will call the office.

## 2016-06-30 NOTE — Telephone Encounter (Signed)
Patient called requesting a refill on her Tramadol.  CB#[phone].  Thank you.

## 2016-06-30 NOTE — Telephone Encounter (Addendum)
Contacted pharmacy and fax form 06/06/16 was never received. Phoned in prescription per Mr. Carlyon Shadow

## 2016-06-30 NOTE — Telephone Encounter (Signed)
Attempted to contact the patient and left message for patient to call the office.  

## 2016-07-25 ENCOUNTER — Other Ambulatory Visit: Payer: Self-pay | Admitting: Rheumatology

## 2016-07-25 MED ORDER — TRAMADOL HCL 50 MG PO TABS: 50.0000 mg | ORAL_TABLET | Freq: Four times a day (QID) | ORAL | 0 refills | Status: DC | PRN

## 2016-07-25 NOTE — Telephone Encounter (Signed)
ok 

## 2016-07-25 NOTE — Telephone Encounter (Signed)
Patient needs a refill on Tramadol. [facility] [place].

## 2016-07-25 NOTE — Telephone Encounter (Signed)
Last Visit: 06/10/16 Next Visit: 12/12/16 Narc Agreement: 01/12/16 UDS: 07/16/15  Okay to refill Tramadol?

## 2016-07-26 ENCOUNTER — Other Ambulatory Visit: Payer: Self-pay | Admitting: Rheumatology

## 2016-07-27 ENCOUNTER — Other Ambulatory Visit: Payer: Self-pay | Admitting: Rheumatology

## 2016-08-07 ENCOUNTER — Other Ambulatory Visit: Payer: Self-pay | Admitting: Rheumatology

## 2016-08-08 NOTE — Telephone Encounter (Signed)
Last Visit: 06/10/16 Next Visit: 12/12/16  Okay to refill per Dr. Estanislado Pandy

## 2016-08-18 ENCOUNTER — Other Ambulatory Visit: Payer: Self-pay | Admitting: Rheumatology

## 2016-08-18 DIAGNOSIS — Z5181 Encounter for therapeutic drug level monitoring: Secondary | ICD-10-CM

## 2016-08-18 NOTE — Telephone Encounter (Signed)
ok 

## 2016-08-18 NOTE — Telephone Encounter (Signed)
Patient needs a refill on Tramadol. [facility] [place].

## 2016-08-18 NOTE — Telephone Encounter (Signed)
Last Visit: 06/10/16 Next Visit: 12/12/16 UDS July 2017 patient is due, called her to advise  Narc Agreement: 01/12/16  Called patient regarding UDS due she states she can come in tomorrow for the lab  When she comes in, ok to give her the Tramadol Rx?

## 2016-08-19 ENCOUNTER — Telehealth: Payer: Self-pay | Admitting: Rheumatology

## 2016-08-19 ENCOUNTER — Other Ambulatory Visit: Payer: Self-pay

## 2016-08-19 DIAGNOSIS — E1165 Type 2 diabetes mellitus with hyperglycemia: Secondary | ICD-10-CM | POA: Diagnosis not present

## 2016-08-19 DIAGNOSIS — M810 Age-related osteoporosis without current pathological fracture: Secondary | ICD-10-CM | POA: Diagnosis not present

## 2016-08-19 DIAGNOSIS — Z713 Dietary counseling and surveillance: Secondary | ICD-10-CM | POA: Diagnosis not present

## 2016-08-19 DIAGNOSIS — Z87891 Personal history of nicotine dependence: Secondary | ICD-10-CM | POA: Diagnosis not present

## 2016-08-19 DIAGNOSIS — Z5181 Encounter for therapeutic drug level monitoring: Secondary | ICD-10-CM | POA: Diagnosis not present

## 2016-08-19 DIAGNOSIS — Z6832 Body mass index (BMI) 32.0-32.9, adult: Secondary | ICD-10-CM | POA: Diagnosis not present

## 2016-08-19 DIAGNOSIS — D509 Iron deficiency anemia, unspecified: Secondary | ICD-10-CM | POA: Diagnosis not present

## 2016-08-19 DIAGNOSIS — K5090 Crohn's disease, unspecified, without complications: Secondary | ICD-10-CM | POA: Diagnosis not present

## 2016-08-19 DIAGNOSIS — E039 Hypothyroidism, unspecified: Secondary | ICD-10-CM | POA: Diagnosis not present

## 2016-08-19 DIAGNOSIS — Z299 Encounter for prophylactic measures, unspecified: Secondary | ICD-10-CM | POA: Diagnosis not present

## 2016-08-19 DIAGNOSIS — E7800 Pure hypercholesterolemia, unspecified: Secondary | ICD-10-CM | POA: Diagnosis not present

## 2016-08-19 NOTE — Telephone Encounter (Signed)
Too soon last Rx was sent in on 07/25/16   Called patient to advise

## 2016-08-19 NOTE — Telephone Encounter (Signed)
Left message for her to call back // She did come in today for the UDS

## 2016-08-19 NOTE — Telephone Encounter (Signed)
Patient is requesting [facility] [place].

## 2016-08-19 NOTE — Telephone Encounter (Signed)
Patient returned Desiree Hogan's call about tramadol.

## 2016-08-19 NOTE — Telephone Encounter (Signed)
Too soon last Rx was sent in on 07/25/16

## 2016-08-20 LAB — PAIN MGMT, PROFILE 5 W/CONF, U
Amphetamines: NEGATIVE ng/mL (ref <500)
BARBITURATES: NEGATIVE ng/mL (ref <300)
Benzodiazepines: NEGATIVE ng/mL (ref <100)
CREATININE: 31.6 mg/dL (ref >=20.0)
Cocaine Metabolite: NEGATIVE ng/mL (ref <150)
MARIJUANA METABOLITE: NEGATIVE ng/mL (ref <20)
METHADONE METABOLITE: NEGATIVE ng/mL (ref <100)
OXIDANT: NEGATIVE ug/mL (ref <200)
OXYCODONE: NEGATIVE ng/mL (ref <100)
Opiates: NEGATIVE ng/mL (ref <100)
PH: 7.25 (ref 4.5–9.0)

## 2016-08-22 MED ORDER — DULOXETINE HCL 30 MG PO CPEP: 30.0000 mg | ORAL_CAPSULE | Freq: Every day | ORAL | 0 refills | Status: DC

## 2016-08-22 MED ORDER — TRAMADOL HCL 50 MG PO TABS: 50.0000 mg | ORAL_TABLET | Freq: Four times a day (QID) | ORAL | 0 refills | Status: DC | PRN

## 2016-08-22 NOTE — Telephone Encounter (Signed)
She should reduce her dose of Cymbalta to 30 mg a day if possible. There is increased risk of serotonin syndrome with tramadol. Or she may reduce her tramadol to 1 tablet twice a day.

## 2016-08-22 NOTE — Telephone Encounter (Signed)
Patient states she takes 4 per day. Sig states take one every 6 hours, which is unusual for you to write with this sig, will you review with me so we can clarify sig, I think it is incorrect.

## 2016-08-22 NOTE — Telephone Encounter (Signed)
I tab q 6 hours  prn should be fine.

## 2016-08-22 NOTE — Addendum Note (Signed)
Addended byCandice Camp on: 08/22/2016 02:12 PM   Modules accepted: Orders

## 2016-08-22 NOTE — Telephone Encounter (Signed)
Patient left a message, returning Amy's call.

## 2016-08-22 NOTE — Addendum Note (Signed)
Addended byCandice Camp on: 08/22/2016 04:36 PM   Modules accepted: Orders

## 2016-08-22 NOTE — Telephone Encounter (Signed)
I have advised patient of the risk, she wants to reduce Cymbalta dose to 49m and keep the dose of Tramadol the same, have sent in new dose of Cymbalta and faxed Tramadol  [facility]

## 2016-08-22 NOTE — Telephone Encounter (Signed)
I have a warning with her Cymbalta 60. Ok to continue

## 2016-08-23 LAB — PAIN MGMT, TRAMADOL W/MEDMATCH, U
DESMETHYLTRAMADOL: 1638 ng/mL — AB (ref <100)
Tramadol: 6949 ng/mL — ABNORMAL HIGH (ref <100)

## 2016-08-23 NOTE — Progress Notes (Signed)
C/w

## 2016-09-02 ENCOUNTER — Other Ambulatory Visit: Payer: Self-pay | Admitting: Rheumatology

## 2016-09-02 NOTE — Telephone Encounter (Signed)
Last Visit: 06/10/16 Next Visit: 12/12/16  Okay to refill per Dr. Estanislado Pandy

## 2016-09-06 ENCOUNTER — Other Ambulatory Visit: Payer: Self-pay | Admitting: Rheumatology

## 2016-09-09 ENCOUNTER — Telehealth: Payer: Self-pay | Admitting: Rheumatology

## 2016-09-09 MED ORDER — TIZANIDINE HCL 4 MG PO TABS: ORAL_TABLET | ORAL | 0 refills | Status: DC

## 2016-09-09 NOTE — Telephone Encounter (Signed)
Patient requesting a refill on generic Zanaflex. [facility] [place].

## 2016-09-09 NOTE — Telephone Encounter (Signed)
06/10/16 last visit  12/12/16 next visit  Ok to refill per Dr Estanislado Pandy

## 2016-09-19 ENCOUNTER — Other Ambulatory Visit: Payer: Self-pay | Admitting: Rheumatology

## 2016-09-19 MED ORDER — TRAMADOL HCL 50 MG PO TABS: 50.0000 mg | ORAL_TABLET | Freq: Four times a day (QID) | ORAL | 0 refills | Status: DC | PRN

## 2016-09-19 NOTE — Telephone Encounter (Signed)
Patient requesting a refill on Tramadol. [facility] uses [facility] [place].

## 2016-09-19 NOTE — Telephone Encounter (Signed)
Last Visit: 06/10/16 Next Visit: 12/12/16 UDS 08/19/16 Narc Agreement: 01/12/16 Last Fill: 08/22/16  Okay to refill Tramadol?

## 2016-10-12 ENCOUNTER — Telehealth: Payer: Self-pay | Admitting: Rheumatology

## 2016-10-12 NOTE — Telephone Encounter (Signed)
ok 

## 2016-10-12 NOTE — Telephone Encounter (Signed)
Patient requesting a refill on Tramadol. [facility] [place].

## 2016-10-12 NOTE — Telephone Encounter (Signed)
Last Visit: 06/10/16 Next Visit: 12/12/16 UDS 08/19/16 Narc Agreement: 01/12/16 Last Fill: 09/19/16  Okay to refill Tramadol?

## 2016-10-13 MED ORDER — TRAMADOL HCL 50 MG PO TABS: 50.0000 mg | ORAL_TABLET | Freq: Four times a day (QID) | ORAL | 0 refills | Status: DC | PRN

## 2016-11-07 ENCOUNTER — Other Ambulatory Visit: Payer: Self-pay

## 2016-11-07 NOTE — Addendum Note (Signed)
Addended by: Carole Binning on: 11/07/2016 03:12 PM   Modules accepted: Orders

## 2016-11-07 NOTE — Telephone Encounter (Signed)
Patient would like a Rx refill Tramadol.  CB# is [phone].  Please advise.  Thank you.

## 2016-11-07 NOTE — Telephone Encounter (Signed)
ok 

## 2016-11-07 NOTE — Telephone Encounter (Signed)
Last Visit: 06/10/16 Next Visit: 12/12/16 UDS 08/19/16 Narc Agreement: 01/12/16  Okay to refill Tramadol?

## 2016-11-08 ENCOUNTER — Other Ambulatory Visit: Payer: Self-pay | Admitting: Rheumatology

## 2016-11-08 MED ORDER — TRAMADOL HCL 50 MG PO TABS: 50.0000 mg | ORAL_TABLET | Freq: Four times a day (QID) | ORAL | 0 refills | Status: DC | PRN

## 2016-11-14 DIAGNOSIS — E7800 Pure hypercholesterolemia, unspecified: Secondary | ICD-10-CM | POA: Diagnosis not present

## 2016-11-21 DIAGNOSIS — J309 Allergic rhinitis, unspecified: Secondary | ICD-10-CM | POA: Diagnosis not present

## 2016-11-21 DIAGNOSIS — J45909 Unspecified asthma, uncomplicated: Secondary | ICD-10-CM | POA: Diagnosis not present

## 2016-11-25 DIAGNOSIS — M79671 Pain in right foot: Secondary | ICD-10-CM | POA: Diagnosis not present

## 2016-11-25 DIAGNOSIS — R937 Abnormal findings on diagnostic imaging of other parts of musculoskeletal system: Secondary | ICD-10-CM | POA: Diagnosis not present

## 2016-11-29 NOTE — Progress Notes (Deleted)
Office Visit Note  Patient: Desiree Hogan             Date of Birth: [birth year]           MRN: [id number]             PCP: Glenda Chroman, MD Referring: Glenda Chroman, MD Visit Date: 12/12/2016 Occupation: @GUAROCC @    Subjective:  No chief complaint on file.   History of Present Illness: Desiree Hogan is a 61 y.o. female ***   Activities of Daily Living:  Patient reports morning stiffness for *** {minute/hour:19697}.   Patient {ACTIONS;DENIES/REPORTS:21021675::"Denies"} nocturnal pain.  Difficulty dressing/grooming: {ACTIONS;DENIES/REPORTS:21021675::"Denies"} Difficulty climbing stairs: {ACTIONS;DENIES/REPORTS:21021675::"Denies"} Difficulty getting out of chair: {ACTIONS;DENIES/REPORTS:21021675::"Denies"} Difficulty using hands for taps, buttons, cutlery, and/or writing: {ACTIONS;DENIES/REPORTS:21021675::"Denies"}   No Rheumatology ROS completed.   PMFS History:  There are no active problems to display for this patient.   Past Medical History:  Diagnosis Date  . Abdominal pain   . Anemia   . Back pain   . Colitis   . COPD (chronic obstructive pulmonary disease) ([place])   . Crohn's disease ([place]) 03/29/12  . Fibromyalgia   . H/O breast biopsy   . Hip pain   . Hyperlipidemia   . Irritable bowel syndrome   . Joint pain   . Nonspecific abnormal finding in stool contents   . Peripheral vascular disease ([place])   . Ulcer    Mouth    Family History  Problem Relation Age of Onset  . Cancer Mother        Colon or vaginal ?  . Deep vein thrombosis Mother   . Hypertension Father   . Diabetes Father   . Heart disease Father        Heart Disease before age 60  . Diabetes Sister   . Diabetes Brother   . Hypertension Brother    Past Surgical History:  Procedure Laterality Date  . CESAREAN SECTION  06/24/82  . CHOLECYSTECTOMY  1993   Gall Bladder  . COLONOSCOPY  Jan. 31, 2014  . [place] SURGERY  2010 and 2011  . TONSILLECTOMY     Social History   Social History  Narrative  . Not on file     Objective: Vital Signs: There were no vitals taken for this visit.   Physical Exam   Musculoskeletal Exam: ***  CDAI Exam: No CDAI exam completed.    Investigation: No additional findings. UDS: 08/19/2016 Narc agreement:01/12/2016 CBC Latest Ref Rng & Units 06/10/2016 04/28/2009 09/19/2008  WBC 3.8 - 10.8 K/uL 7.6 8.4 5.8  Hemoglobin 11.7 - 15.5 g/dL 12.6 12.4 12.0  Hematocrit 35.0 - 45.0 % 38.5 34.8(L) 34.8(L)  Platelets 140 - 400 K/uL 259 205 172   CMP Latest Ref Rng & Units 06/10/2016  Glucose 65 - 99 mg/dL 106(H)  BUN 7 - 25 mg/dL 14  Creatinine 0.50 - 0.99 mg/dL 0.75  Sodium 135 - 146 mmol/L 140  Potassium 3.5 - 5.3 mmol/L 4.5  Chloride 98 - 110 mmol/L 104  CO2 20 - 31 mmol/L 24  Calcium 8.6 - 10.4 mg/dL 9.9  Total Protein 6.1 - 8.1 g/dL 7.0  Total Bilirubin 0.2 - 1.2 mg/dL 0.5  Alkaline Phos 33 - 130 U/L 76  AST 10 - 35 U/L 18  ALT 6 - 29 U/L 13    Imaging: No results found.  Speciality Comments: No specialty comments available.    Procedures:  No procedures performed Allergies: Bactrim [sulfamethoxazole-trimethoprim]; Penicillins; and Tetracyclines &  related   Assessment / Plan:     Visit Diagnoses: No diagnosis found.    Orders: No orders of the defined types were placed in this encounter.  No orders of the defined types were placed in this encounter.   Face-to-face time spent with patient was *** minutes. 50% of time was spent in counseling and coordination of care.  Follow-Up Instructions: No Follow-up on file.   Earnestine Mealing, CMA  Note - This record has been created using [employer].  Chart creation errors have been sought, but may not always  have been located. Such creation errors do not reflect on  the standard of medical care.

## 2016-12-02 ENCOUNTER — Telehealth: Payer: Self-pay

## 2016-12-02 MED ORDER — TRAMADOL HCL 50 MG PO TABS: 50.0000 mg | ORAL_TABLET | Freq: Four times a day (QID) | ORAL | 0 refills | Status: DC | PRN

## 2016-12-02 NOTE — Telephone Encounter (Signed)
Patient would like a Rx refill on Tramadol.  CB# is [phone].  Please advise.  Thank you.

## 2016-12-02 NOTE — Addendum Note (Signed)
Addended by: Carole Binning on: 12/02/2016 05:00 PM   Modules accepted: Orders

## 2016-12-02 NOTE — Telephone Encounter (Signed)
Last Visit: 06/10/16 Next Visit: 12/12/16 UDS8/17/18 Narc Agreement: 01/12/16  Okay to refill Tramadol?

## 2016-12-02 NOTE — Telephone Encounter (Signed)
ok 

## 2016-12-08 ENCOUNTER — Telehealth: Payer: Self-pay

## 2016-12-08 MED ORDER — TIZANIDINE HCL 4 MG PO TABS: ORAL_TABLET | ORAL | 0 refills | Status: DC

## 2016-12-08 NOTE — Telephone Encounter (Signed)
Patient would like a Rx refill on Tizanidine.  CB# [phone].  Please advise.  Thank you.

## 2016-12-08 NOTE — Addendum Note (Signed)
Addended by: Carole Binning on: 12/08/2016 01:11 PM   Modules accepted: Orders

## 2016-12-08 NOTE — Telephone Encounter (Signed)
Last Visit: 06/10/16 Next Visit: 12/12/16  Okay to refill per Dr. Estanislado Pandy

## 2016-12-12 ENCOUNTER — Ambulatory Visit: Payer: No Typology Code available for payment source | Admitting: Rheumatology

## 2016-12-13 ENCOUNTER — Inpatient Hospital Stay (HOSPITAL_COMMUNITY)
Admission: EM | Admit: 2016-12-13 | Discharge: 2016-12-14 | DRG: 194 | Disposition: A | Discharge status: Home / Self Care | Payer: 59 | Attending: Family Medicine | Admitting: Family Medicine

## 2016-12-13 ENCOUNTER — Other Ambulatory Visit: Payer: Self-pay

## 2016-12-13 ENCOUNTER — Emergency Department (HOSPITAL_COMMUNITY): Payer: 59

## 2016-12-13 ENCOUNTER — Encounter (HOSPITAL_COMMUNITY): Payer: Self-pay | Admitting: Emergency Medicine

## 2016-12-13 DIAGNOSIS — E119 Type 2 diabetes mellitus without complications: Secondary | ICD-10-CM

## 2016-12-13 DIAGNOSIS — E039 Hypothyroidism, unspecified: Secondary | ICD-10-CM | POA: Diagnosis present

## 2016-12-13 DIAGNOSIS — J189 Pneumonia, unspecified organism: Principal | ICD-10-CM | POA: Diagnosis present

## 2016-12-13 DIAGNOSIS — Z79899 Other long term (current) drug therapy: Secondary | ICD-10-CM

## 2016-12-13 DIAGNOSIS — J441 Chronic obstructive pulmonary disease with (acute) exacerbation: Secondary | ICD-10-CM | POA: Diagnosis present

## 2016-12-13 DIAGNOSIS — E876 Hypokalemia: Secondary | ICD-10-CM | POA: Diagnosis present

## 2016-12-13 DIAGNOSIS — R51 Headache: Secondary | ICD-10-CM

## 2016-12-13 DIAGNOSIS — R519 Headache, unspecified: Secondary | ICD-10-CM

## 2016-12-13 DIAGNOSIS — J440 Chronic obstructive pulmonary disease with acute lower respiratory infection: Secondary | ICD-10-CM | POA: Diagnosis present

## 2016-12-13 DIAGNOSIS — Z87891 Personal history of nicotine dependence: Secondary | ICD-10-CM | POA: Diagnosis not present

## 2016-12-13 DIAGNOSIS — R0902 Hypoxemia: Secondary | ICD-10-CM

## 2016-12-13 DIAGNOSIS — J9811 Atelectasis: Secondary | ICD-10-CM | POA: Diagnosis present

## 2016-12-13 DIAGNOSIS — E1151 Type 2 diabetes mellitus with diabetic peripheral angiopathy without gangrene: Secondary | ICD-10-CM | POA: Diagnosis present

## 2016-12-13 DIAGNOSIS — R059 Cough, unspecified: Secondary | ICD-10-CM

## 2016-12-13 DIAGNOSIS — D72829 Elevated white blood cell count, unspecified: Secondary | ICD-10-CM | POA: Diagnosis present

## 2016-12-13 DIAGNOSIS — J181 Lobar pneumonia, unspecified organism: Secondary | ICD-10-CM | POA: Diagnosis not present

## 2016-12-13 DIAGNOSIS — R05 Cough: Secondary | ICD-10-CM

## 2016-12-13 DIAGNOSIS — M797 Fibromyalgia: Secondary | ICD-10-CM | POA: Diagnosis present

## 2016-12-13 DIAGNOSIS — E785 Hyperlipidemia, unspecified: Secondary | ICD-10-CM | POA: Diagnosis present

## 2016-12-13 DIAGNOSIS — J301 Allergic rhinitis due to pollen: Secondary | ICD-10-CM | POA: Diagnosis present

## 2016-12-13 HISTORY — DX: Type 2 diabetes mellitus without complications: E11.9

## 2016-12-13 LAB — CBC WITH DIFFERENTIAL/PLATELET
BASOS PCT: 0 %
Basophils Absolute: 0 10*3/uL (ref 0.0–0.1)
EOS ABS: 0.1 10*3/uL (ref 0.0–0.7)
Eosinophils Relative: 0 %
HEMATOCRIT: 43.4 % (ref 36.0–46.0)
HEMOGLOBIN: 14.2 g/dL (ref 12.0–15.0)
LYMPHS ABS: 2.2 10*3/uL (ref 0.7–4.0)
Lymphocytes Relative: 12 %
MCH: 31.1 pg (ref 26.0–34.0)
MCHC: 32.7 g/dL (ref 30.0–36.0)
MCV: 95.2 fL (ref 78.0–100.0)
MONOS PCT: 7 %
Monocytes Absolute: 1.4 10*3/uL — ABNORMAL HIGH (ref 0.1–1.0)
NEUTROS ABS: 15.2 10*3/uL — AB (ref 1.7–7.7)
NEUTROS PCT: 81 %
Platelets: 270 10*3/uL (ref 150–400)
RBC: 4.56 MIL/uL (ref 3.87–5.11)
RDW: 12.2 % (ref 11.5–15.5)
WBC: 18.8 10*3/uL — AB (ref 4.0–10.5)

## 2016-12-13 LAB — BASIC METABOLIC PANEL
Anion gap: 10 (ref 5–15)
BUN: 18 mg/dL (ref 6–20)
CHLORIDE: 103 mmol/L (ref 101–111)
CO2: 23 mmol/L (ref 22–32)
CREATININE: 0.87 mg/dL (ref 0.44–1.00)
Calcium: 9.3 mg/dL (ref 8.9–10.3)
GFR calc non Af Amer: >60 mL/min (ref >60)
Glucose, Bld: 128 mg/dL — ABNORMAL HIGH (ref 65–99)
POTASSIUM: 3.3 mmol/L — AB (ref 3.5–5.1)
SODIUM: 136 mmol/L (ref 135–145)

## 2016-12-13 LAB — URINALYSIS, ROUTINE W REFLEX MICROSCOPIC
Bilirubin Urine: NEGATIVE
Glucose, UA: NEGATIVE mg/dL
Hgb urine dipstick: NEGATIVE
Ketones, ur: NEGATIVE mg/dL
Leukocytes, UA: NEGATIVE
NITRITE: NEGATIVE
PH: 6 (ref 5.0–8.0)
Protein, ur: NEGATIVE mg/dL
SPECIFIC GRAVITY, URINE: 1.035 — AB (ref 1.005–1.030)

## 2016-12-13 LAB — HEPATIC FUNCTION PANEL
ALBUMIN: 3.6 g/dL (ref 3.5–5.0)
ALT: 13 U/L — ABNORMAL LOW (ref 14–54)
AST: 18 U/L (ref 15–41)
Alkaline Phosphatase: 77 U/L (ref 38–126)
Bilirubin, Direct: 0.2 mg/dL (ref 0.1–0.5)
Indirect Bilirubin: 0.9 mg/dL (ref 0.3–0.9)
TOTAL PROTEIN: 6.8 g/dL (ref 6.5–8.1)
Total Bilirubin: 1.1 mg/dL (ref 0.3–1.2)

## 2016-12-13 LAB — INFLUENZA PANEL BY PCR (TYPE A & B)
INFLBPCR: NEGATIVE
Influenza A By PCR: NEGATIVE

## 2016-12-13 LAB — TROPONIN I

## 2016-12-13 MED ORDER — FLUTICASONE FUROATE-VILANTEROL 100-25 MCG/INH IN AEPB
1.0000 | INHALATION_SPRAY | Freq: Every day | RESPIRATORY_TRACT | Status: DC
  Administered 2016-12-14: 1 via RESPIRATORY_TRACT
  Filled 2016-12-13: qty 28

## 2016-12-13 MED ORDER — POTASSIUM CHLORIDE IN NACL 20-0.9 MEQ/L-% IV SOLN
INTRAVENOUS | Status: DC
  Administered 2016-12-13 – 2016-12-14 (×2): via INTRAVENOUS

## 2016-12-13 MED ORDER — LEVOTHYROXINE SODIUM 25 MCG PO TABS
125.0000 ug | ORAL_TABLET | Freq: Every day | ORAL | Status: DC
  Administered 2016-12-13 – 2016-12-14 (×2): 125 ug via ORAL
  Filled 2016-12-13 (×2): qty 1

## 2016-12-13 MED ORDER — ACETAMINOPHEN 500 MG PO TABS
1000.0000 mg | ORAL_TABLET | Freq: Once | ORAL | Status: AC
  Administered 2016-12-13: 1000 mg via ORAL
  Filled 2016-12-13: qty 2

## 2016-12-13 MED ORDER — IPRATROPIUM BROMIDE 0.06 % NA SOLN
1.0000 | Freq: Every day | NASAL | Status: DC | PRN
  Filled 2016-12-13: qty 15

## 2016-12-13 MED ORDER — CALCIUM GLUCONATE 500 MG PO TABS: 600.0000 mg | ORAL_TABLET | Freq: Every day | ORAL | Status: DC

## 2016-12-13 MED ORDER — LEVOFLOXACIN IN D5W 750 MG/150ML IV SOLN
750.0000 mg | INTRAVENOUS | Status: DC
  Administered 2016-12-14: 750 mg via INTRAVENOUS
  Filled 2016-12-13: qty 150

## 2016-12-13 MED ORDER — ALBUTEROL SULFATE (2.5 MG/3ML) 0.083% IN NEBU: 2.5000 mg | INHALATION_SOLUTION | Freq: Four times a day (QID) | RESPIRATORY_TRACT | Status: DC | PRN

## 2016-12-13 MED ORDER — DULOXETINE HCL 30 MG PO CPEP
30.0000 mg | ORAL_CAPSULE | Freq: Every day | ORAL | Status: DC
  Administered 2016-12-13 – 2016-12-14 (×2): 30 mg via ORAL
  Filled 2016-12-13 (×2): qty 1

## 2016-12-13 MED ORDER — LORATADINE 10 MG PO TABS
10.0000 mg | ORAL_TABLET | Freq: Every day | ORAL | Status: DC
  Administered 2016-12-13 – 2016-12-14 (×2): 10 mg via ORAL
  Filled 2016-12-13 (×2): qty 1

## 2016-12-13 MED ORDER — IOPAMIDOL (ISOVUE-300) INJECTION 61%
75.0000 mL | Freq: Once | INTRAVENOUS | Status: AC | PRN
  Administered 2016-12-13: 75 mL via INTRAVENOUS

## 2016-12-13 MED ORDER — CALCIUM GLUCONATE 500 MG PO TABS
1.0000 | ORAL_TABLET | Freq: Every day | ORAL | Status: DC
  Filled 2016-12-13 (×2): qty 1

## 2016-12-13 MED ORDER — GEMFIBROZIL 600 MG PO TABS
600.0000 mg | ORAL_TABLET | Freq: Two times a day (BID) | ORAL | Status: DC
  Administered 2016-12-14: 600 mg via ORAL
  Filled 2016-12-13: qty 1

## 2016-12-13 MED ORDER — TIOTROPIUM BROMIDE MONOHYDRATE 18 MCG IN CAPS
18.0000 ug | ORAL_CAPSULE | Freq: Every day | RESPIRATORY_TRACT | Status: DC
  Administered 2016-12-14: 18 ug via RESPIRATORY_TRACT
  Filled 2016-12-13: qty 5

## 2016-12-13 MED ORDER — PREDNISONE 20 MG PO TABS
60.0000 mg | ORAL_TABLET | Freq: Every day | ORAL | Status: DC
  Administered 2016-12-14: 60 mg via ORAL
  Filled 2016-12-13: qty 3

## 2016-12-13 MED ORDER — FENTANYL CITRATE (PF) 100 MCG/2ML IJ SOLN
50.0000 ug | Freq: Once | INTRAMUSCULAR | Status: AC
  Administered 2016-12-13: 50 ug via INTRAVENOUS
  Filled 2016-12-13: qty 2

## 2016-12-13 MED ORDER — SODIUM CHLORIDE 0.9 % IV BOLUS (SEPSIS)
1000.0000 mL | Freq: Once | INTRAVENOUS | Status: AC
  Administered 2016-12-13: 1000 mL via INTRAVENOUS

## 2016-12-13 MED ORDER — TIZANIDINE HCL 4 MG PO TABS
4.0000 mg | ORAL_TABLET | Freq: Every day | ORAL | Status: DC
  Administered 2016-12-13: 4 mg via ORAL
  Filled 2016-12-13: qty 1

## 2016-12-13 MED ORDER — ONDANSETRON HCL 4 MG/2ML IJ SOLN: 4.0000 mg | Freq: Four times a day (QID) | INTRAMUSCULAR | Status: DC | PRN

## 2016-12-13 MED ORDER — TRAMADOL HCL 50 MG PO TABS: 50.0000 mg | ORAL_TABLET | Freq: Four times a day (QID) | ORAL | Status: DC | PRN

## 2016-12-13 MED ORDER — LEVOTHYROXINE SODIUM 50 MCG PO TABS: 125.0000 ug | ORAL_TABLET | Freq: Every day | ORAL | Status: DC

## 2016-12-13 MED ORDER — SODIUM CHLORIDE 0.9 % IV SOLN
8.0000 mg | Freq: Once | INTRAVENOUS | Status: AC
  Administered 2016-12-13: 8 mg via INTRAVENOUS
  Filled 2016-12-13: qty 4

## 2016-12-13 MED ORDER — DEXTROSE 5 % IV SOLN
500.0000 mg | Freq: Once | INTRAVENOUS | Status: AC
  Administered 2016-12-13: 500 mg via INTRAVENOUS
  Filled 2016-12-13: qty 500

## 2016-12-13 MED ORDER — GABAPENTIN 300 MG PO CAPS
300.0000 mg | ORAL_CAPSULE | Freq: Three times a day (TID) | ORAL | Status: DC
  Administered 2016-12-13 – 2016-12-14 (×2): 300 mg via ORAL
  Filled 2016-12-13 (×3): qty 1

## 2016-12-13 MED ORDER — HYDROCODONE-HOMATROPINE 5-1.5 MG/5ML PO SYRP
5.0000 mL | ORAL_SOLUTION | Freq: Four times a day (QID) | ORAL | Status: DC | PRN
  Administered 2016-12-13 – 2016-12-14 (×2): 5 mL via ORAL
  Filled 2016-12-13 (×2): qty 5

## 2016-12-13 MED ORDER — ENOXAPARIN SODIUM 40 MG/0.4ML ~~LOC~~ SOLN
40.0000 mg | SUBCUTANEOUS | Status: DC
  Administered 2016-12-13: 40 mg via SUBCUTANEOUS
  Filled 2016-12-13: qty 0.4

## 2016-12-13 NOTE — Progress Notes (Signed)
Pharmacy Note:  Initial antibiotic(s) regimen of Levaquin ordered to treat CAP.  Estimated Creatinine Clearance: 71.5 mL/min (by C-G formula based on SCr of 0.87 mg/dL).   Allergies  Allergen Reactions  . Bactrim [Sulfamethoxazole-Trimethoprim] Itching    And sores  . Penicillins Anaphylaxis    Has patient had a PCN reaction causing immediate rash, facial/tongue/throat swelling, SOB or lightheadedness with hypotension: Yes Has patient had a PCN reaction causing severe rash involving mucus membranes or skin necrosis: Yes Has patient had a PCN reaction that required hospitalization: No Has patient had a PCN reaction occurring within the last 10 years: No If all of the above answers are "NO", then may proceed with Cephalosporin use.   . Tetracyclines & Related Swelling and Rash    Vitals:   12/13/16 1730 12/13/16 1818  BP: (!) 110/52 (!) 114/52  Pulse: 87 81  Resp: 17 19  Temp:  98.3 F (36.8 C)  SpO2: 91% 95%    Anti-infectives (From admission, onward)   Start     Dose/Rate Route Frequency Ordered Stop   12/14/16 0600  levofloxacin (LEVAQUIN) IVPB 750 mg     750 mg 100 mL/hr over 90 Minutes Intravenous Every 24 hours 12/13/16 1952     12/13/16 1330  azithromycin (ZITHROMAX) 500 mg in dextrose 5 % 250 mL IVPB     500 mg 250 mL/hr over 60 Minutes Intravenous  Once 12/13/16 1315 12/13/16 1503      Plan: Initial dose(s) of Levaquin 756m q24hrs  Pt already received Zithromax 5026mx 1 so Levaquin started ~18 hrs after dose of Zithromax due to QT prolongation.  F/U admission orders for further dosing if therapy continued.  HaEna DawleyRPBaylor [facility]2/11/2016 7:52 PM

## 2016-12-13 NOTE — H&P (Addendum)
TRH H&P   Patient Demographics:    Desiree Hogan, is a 61 y.o. female  MRN: [id number]   DOB - [birth year]  Admit Date - 12/13/2016  Outpatient Primary MD for the patient is Glenda Chroman, MD  Referring MD/NP/PA: Merrily Pew  Outpatient Specialists:   Patient coming from: home  Chief Complaint  Patient presents with  . Cough      HPI:    Desiree Hogan  is a 61 y.o. female, w Hypothyroidism, Dm2, Hyperlipidemia, Copd apparently c/o cough (dry) for a few days,  + subjective fever and chills, pt notes dyspnea, wheezing.  N/v x1 today.  Pt denies abd pain, diarrhea, brbpr, black stool.  Pt was recently on levaquin and prednisone and was feeling better,  then her medication finished and she felt worse. Pt was encouraged to come to ED by her family   In Ed,  CT chest IMPRESSION: Right posterior basilar airspace opacity is noted concerning for pneumonia or atelectasis.  Two ill-defined densities are noted in the right upper lobe with some degree of associated ground-glass opacity which may represent focal inflammation, but followup unenhanced chest CT scan in 3 months is recommended to ensure resolution and rule out underlying neoplasm.  Aortic Atherosclerosis (ICD10-I70.0) and Emphysema (ICD10-J43.9).  Wbc 18.8, Hgb 14.2, Plt 270 Na 136, K 3.3 Glucose 128 Bun 18, Creatinine 0.87 Ast 18, Alt 13 Urinalysis negative Inlfuenza panel pending  Pt will be admitted for hypoxia secondary to CAP.    Review of systems:    In addition to the HPI above,  Slight n/v No Fever-chills, No Headache, No changes with Vision or hearing, No problems swallowing food or Liquids, No Chest pain ,  No Abdominal pain,  Bowel movements are regular, No Blood in stool or Urine, No dysuria, No new skin rashes or bruises, No new joints pains-aches,  No new weakness, tingling, numbness  in any extremity, No recent weight gain or loss, No polyuria, polydypsia or polyphagia, No significant Mental Stressors.  A full 10 point Review of Systems was done, except as stated above, all other Review of Systems were negative.   With Past History of the following :    Past Medical History:  Diagnosis Date  . Abdominal pain   . Anemia   . Back pain   . Colitis   . COPD (chronic obstructive pulmonary disease) ([place])   . Crohn's disease ([place]) 03/29/12  . Diabetes mellitus without complication ([place])   . Fibromyalgia   . H/O breast biopsy   . Hip pain   . Hyperlipidemia   . Irritable bowel syndrome   . Joint pain   . Nonspecific abnormal finding in stool contents   . Peripheral vascular disease ([place])   . Ulcer    Mouth      Past Surgical History:  Procedure Laterality Date  . CESAREAN SECTION  06/24/82  .  CHOLECYSTECTOMY  1993   Gall Bladder  . COLONOSCOPY  Jan. 31, 2014  . [place] SURGERY  2010 and 2011  . TONSILLECTOMY        Social History:     Social History   Tobacco Use  . Smoking status: Former Smoker    Packs/day: 0.75    Years: 20.00    Pack years: 15.00    Types: Cigarettes    Last attempt to quit: 01/03/2009    Years since quitting: 7.9  . Smokeless tobacco: Never Used  Substance Use Topics  . Alcohol use: No     Lives -  At home Mobility -  Walks by self   Family History :     Family History  Problem Relation Age of Onset  . Cancer Mother        Colon or vaginal ?  . Deep vein thrombosis Mother   . Hypertension Father   . Diabetes Father   . Heart disease Father        Heart Disease before age 49  . Diabetes Sister   . Diabetes Brother   . Hypertension Brother       Home Medications:   Prior to Admission medications   Medication Sig Start Date End Date Taking? Authorizing Provider  alendronate (FOSAMAX) 70 MG tablet Take 70 mg by mouth every 7 (seven) days. Take with a full glass of water on an empty stomach.   Yes  [provider]  BREO ELLIPTA 100-25 MCG/INH AEPB Inhale 1 puff into the lungs daily.  09/28/15  Yes [provider]  calcium gluconate 500 MG tablet Take 600 mg by mouth daily.    Yes [provider]  cetirizine (ZYRTEC) 10 MG tablet Take 10 mg by mouth daily.   Yes [provider]  DM-Doxylamine-Acetaminophen (NYQUIL COLD & FLU PO) Take 10-15 mLs by mouth daily as needed (for cold and flu).   Yes [provider]  DULoxetine (CYMBALTA) 30 MG capsule Take 1 capsule (30 mg total) by mouth daily. 08/22/16  Yes Deveshwar, Abel Presto, MD  gabapentin (NEURONTIN) 300 MG capsule Take 300 mg by mouth 3 (three) times daily.   Yes [provider]  gemfibrozil (LOPID) 600 MG tablet Take 600 mg by mouth 2 (two) times daily before a meal.   Yes [provider]  ipratropium (ATROVENT) 0.06 % nasal spray Place 1 spray into both nostrils daily as needed for rhinitis.  11/18/15  Yes [provider]  levothyroxine (SYNTHROID, LEVOTHROID) 125 MCG tablet Take 125 mcg by mouth daily. 11/29/16  Yes [provider]  PROAIR HFA 108 (90 Base) MCG/ACT inhaler Inhale 1-2 puffs into the lungs every 6 (six) hours as needed for wheezing or shortness of breath.  11/18/15  Yes [provider]  tiZANidine (ZANAFLEX) 4 MG tablet TAKE 1 TABLET BY MOUTH EVERYDAY AT BEDTIME 12/08/16  Yes Deveshwar, Abel Presto, MD  traMADol (ULTRAM) 50 MG tablet Take 1 tablet (50 mg total) by mouth every 6 (six) hours as needed. 12/02/16  Yes Deveshwar, Abel Presto, MD  predniSONE (DELTASONE) 10 MG tablet Take 10 mg by mouth as needed.     [provider]     Allergies:     Allergies  Allergen Reactions  . Bactrim [Sulfamethoxazole-Trimethoprim] Itching    And sores  . Penicillins Anaphylaxis    Has patient had a PCN reaction causing immediate rash, facial/tongue/throat swelling, SOB or lightheadedness with hypotension: Yes Has patient had a PCN reaction causing  severe rash involving mucus membranes or skin necrosis: Yes Has patient had a PCN reaction that required hospitalization: No Has patient had a PCN reaction occurring within the last 10 years: No If all of the above answers are "NO", then may proceed with Cephalosporin use.   . Tetracyclines & Related Swelling and Rash     Physical Exam:   Vitals  Blood pressure 128/85, pulse 89, temperature 98 F (36.7 C), temperature source Oral, resp. rate (!) 24, height 5' 4"  (1.626 m), weight 79.4 kg (175 lb), SpO2 92 %.   1. General  lying in bed in NAD,    2. Normal affect and insight, Not Suicidal or Homicidal, Awake Alert, Oriented X 3.  3. No F.N deficits, ALL C.Nerves Intact, Strength 5/5 all 4 extremities, Sensation intact all 4 extremities, Plantars down going.  4. Ears and Eyes appear Normal, Conjunctivae clear, PERRLA. Moist Oral Mucosa.  5. Supple Neck, No JVD, No cervical lymphadenopathy appriciated, No Carotid Bruits.  6. Symmetrical Chest wall movement, Good air movement bilaterally, CTAB.  7. RRR, No Gallops, Rubs or Murmurs, No Parasternal Heave.  8. Positive Bowel Sounds, Abdomen Soft, No tenderness, No organomegaly appriciated,No rebound -guarding or rigidity.  9.  No Cyanosis, Normal Skin Turgor, No Skin Rash or Bruise.  10. Good muscle tone,  joints appear normal , no effusions, Normal ROM.  11. No Palpable Lymph Nodes in Neck or Axillae     Data Review:    CBC Recent Labs  Lab 12/13/16 1147  WBC 18.8*  HGB 14.2  HCT 43.4  PLT 270  MCV 95.2  MCH 31.1  MCHC 32.7  RDW 12.2  LYMPHSABS 2.2  MONOABS 1.4*  EOSABS 0.1  BASOSABS 0.0   ------------------------------------------------------------------------------------------------------------------  Chemistries  Recent Labs  Lab 12/13/16 1147 12/13/16 1151  NA 136  --   K 3.3*  --   CL 103  --   CO2 23  --   GLUCOSE 128*  --   BUN 18  --   CREATININE 0.87  --   CALCIUM 9.3  --   AST  --  18    ALT  --  13*  ALKPHOS  --  77  BILITOT  --  1.1   ------------------------------------------------------------------------------------------------------------------ estimated creatinine clearance is 69.3 mL/min (by C-G formula based on SCr of 0.87 mg/dL). ------------------------------------------------------------------------------------------------------------------ No results for input(s): TSH, T4TOTAL, T3FREE, THYROIDAB in the last 72 hours.  Invalid input(s): FREET3  Coagulation profile No results for input(s): INR, PROTIME in the last 168 hours. ------------------------------------------------------------------------------------------------------------------- No results for input(s): DDIMER in the last 72 hours. -------------------------------------------------------------------------------------------------------------------  Cardiac Enzymes No results for input(s): CKMB, TROPONINI, MYOGLOBIN in the last 168 hours.  Invalid input(s): CK ------------------------------------------------------------------------------------------------------------------ No results found for: BNP   ---------------------------------------------------------------------------------------------------------------  Urinalysis    Component Value Date/Time   COLORURINE YELLOW 12/13/2016 1600   APPEARANCEUR CLEAR 12/13/2016 1600   LABSPEC 1.035 (H) 12/13/2016 1600   PHURINE 6.0 12/13/2016 1600   GLUCOSEU NEGATIVE 12/13/2016 1600   HGBUR NEGATIVE 12/13/2016 1600   BILIRUBINUR NEGATIVE 12/13/2016 1600   KETONESUR NEGATIVE 12/13/2016 1600   PROTEINUR NEGATIVE 12/13/2016 1600   NITRITE NEGATIVE 12/13/2016 1600   LEUKOCYTESUR NEGATIVE 12/13/2016 1600    ----------------------------------------------------------------------------------------------------------------   Imaging Results:    Dg Chest 2 View  Result Date: 12/13/2016 CLINICAL DATA:  Productive cough, chest and back pain, fever, and  shortness of breath. History of COPD, former smoker. EXAM: CHEST  2 VIEW COMPARISON:  PA and lateral chest x-ray  of October 03, 2012 FINDINGS: The lungs are adequately inflated. There are coarse lung markings in the left lower lobe. There is no pleural effusion or pneumothorax. The heart and pulmonary vascularity are normal. There is calcification in the wall of the aortic arch. The bony thorax exhibits no acute abnormality. IMPRESSION: Left lower lobe pneumonia. Followup PA and lateral chest X-ray is recommended in 3-4 weeks following trial of antibiotic therapy to ensure resolution and exclude underlying malignancy. Thoracic aortic atherosclerosis. Electronically Signed   By: David  Martinique M.D.   On: 12/13/2016 11:57   Ct Chest W Contrast  Result Date: 12/13/2016 CLINICAL DATA:  Cough, fever. EXAM: CT CHEST WITH CONTRAST TECHNIQUE: Multidetector CT imaging of the chest was performed during intravenous contrast administration. CONTRAST:  2m ISOVUE-300 IOPAMIDOL (ISOVUE-300) INJECTION 61% COMPARISON:  Radiographs of same day.  CT scan of July 12, 2012. FINDINGS: Cardiovascular: Atherosclerosis of thoracic aorta is noted without aneurysm or dissection. Normal cardiac size. No pericardial effusion is noted. Mediastinum/Nodes: No enlarged mediastinal, hilar, or axillary lymph nodes. Thyroid gland, trachea, and esophagus demonstrate no significant findings. Lungs/Pleura: No pneumothorax or pleural effusion is noted. Mild emphysematous disease is noted in the upper lobes bilaterally. Right posterior basilar airspace opacity is noted concerning for pneumonia or atelectasis. Two ill-defined airspace opacities are noted in the right upper lobe with some degree of ground-glass opacity which may represent focal inflammation, but follow-up is recommended to ensure resolution. Upper Abdomen: No acute abnormality. Musculoskeletal: No chest wall abnormality. No acute or significant osseous findings. IMPRESSION: Right  posterior basilar airspace opacity is noted concerning for pneumonia or atelectasis. Two ill-defined densities are noted in the right upper lobe with some degree of associated ground-glass opacity which may represent focal inflammation, but followup unenhanced chest CT scan in 3 months is recommended to ensure resolution and rule out underlying neoplasm. Aortic Atherosclerosis (ICD10-I70.0) and Emphysema (ICD10-J43.9). Electronically Signed   By: JMarijo Conception M.D.   On: 12/13/2016 14:11      Assessment & Plan:    Active Problems:   Community acquired pneumonia    Dyspnea secondary to CAP, Copd Blood culture x2 Urine legionella antigen Urine strep antigen Respiratory Virus panel Levaquin iv pharmacy to dose  Copd exacerbation Prednisone 659mpo qday levaquin   spiriva 1puff qday Breo 1 puff qday  Hayfever  Cont atrovent nasal spray.   Hypokalemia Replete Check cmp in am  Leukocytosis Secondary to CAP Check cbc in am  Hyperlipidemia Cont gemfibrozil  Hypothyroidism Cont levothyroxine  FM Cont duloxetine Cont gabapentin  Dm2 fsbs ac and qhs, ISS  DVT Prophylaxis Lovenox - SCDs  AM Labs Ordered, also please review Full Orders  Family Communication: Admission, patients condition and plan of care including tests being ordered have been discussed with the patient  who indicate understanding and agree with the plan and Code Status.  Code Status FULL CODE  Likely DC to  home  Condition GUARDED    Consults called: none  Admission status:  inpatient   Time spent in minutes : 45   JaJani Gravel.D on 12/13/2016 at 4:40 PM  Between 7am to 7pm - Pager - [phone] After 7pm go to [email or web address] - password [email or web address] - Office  [phone]

## 2016-12-13 NOTE — ED Triage Notes (Signed)
Cough/fever/chills since before Thanksgiving. pcp gave her abx and prednisone with no relief. gen weakness noted.

## 2016-12-13 NOTE — ED Notes (Signed)
Pt returned from CT °

## 2016-12-13 NOTE — ED Notes (Signed)
Pt's family called RN to room. Pt's O2 sat 77% on RA. Pt c/o SOB. 2L of O2 via [place] placed on pt. Dr. Dayna Barker notified.

## 2016-12-13 NOTE — ED Provider Notes (Signed)
Emergency Department Provider Note   I have reviewed the triage vital signs and the nursing notes.   HISTORY  Chief Complaint Cough   HPI Desiree Hogan is a 61 y.o. female with multiple medical problems who presents the emergency department today for a month worth of cough.  She states a couple weeks before Thanksgiving she was given a round of antibiotics and steroids for what they thought was a sinus infection.  She states that it seemed to help much but she did not really get much worse until the last few days she has significant worse with coughing right lower back pain, chills, fever.  Also with just general weakness.  She states that she has not been sick like this before. She does have a history of kidney problems and back surgeries.  No other associated modifying symptoms.  Has not tried any else to help with the symptoms.    Past Medical History:  Diagnosis Date  . Abdominal pain   . Anemia   . Back pain   . Colitis   . COPD (chronic obstructive pulmonary disease) ([place])   . Crohn's disease ([place]) 03/29/12  . Diabetes mellitus without complication ([place])   . Fibromyalgia   . H/O breast biopsy   . Hip pain   . Hyperlipidemia   . Irritable bowel syndrome   . Joint pain   . Nonspecific abnormal finding in stool contents   . Peripheral vascular disease ([place])   . Ulcer    Mouth    Patient Active Problem List   Diagnosis Date Noted  . Community acquired pneumonia 12/13/2016    Past Surgical History:  Procedure Laterality Date  . CESAREAN SECTION  06/24/82  . CHOLECYSTECTOMY  1993   Gall Bladder  . COLONOSCOPY  Jan. 31, 2014  . [place] [facility]GERY  2010 and 2011  . TONSILLECTOMY      Current Outpatient Rx  . Order #: [id number] Class: Historical Med  . Order #: [id number] Class: Historical Med  . Order #: [id number] Class: Historical Med  . Order #: [id number] Class: Historical Med  . Order #: [id number] Class: Historical Med  . Order #: [id number] Class: Normal  . Order #:  [id number] Class: Historical Med  . Order #: [id number] Class: Historical Med  . Order #: [id number] Class: Historical Med  . Order #: [id number] Class: Historical Med  . Order #: [id number] Class: Historical Med  . Order #: [id number] Class: Normal  . Order #: [id number] Class: Print  . Order #: [id number] Class: Historical Med    Allergies Bactrim [sulfamethoxazole-trimethoprim]; Penicillins; and Tetracyclines & related  Family History  Problem Relation Age of Onset  . Cancer Mother        Colon or vaginal ?  . Deep vein thrombosis Mother   . Hypertension Father   . Diabetes Father   . Heart disease Father        Heart Disease before age 79  . Diabetes Sister   . Diabetes Brother   . Hypertension Brother     Social History Social History   Tobacco Use  . Smoking status: Former Smoker    Packs/day: 0.75    Years: 20.00    Pack years: 15.00    Types: Cigarettes    Last attempt to quit: 01/03/2009    Years since quitting: 7.9  . Smokeless tobacco: Never Used  Substance Use Topics  . Alcohol use: No  . Drug use: No    Review of Systems  All other systems negative except as documented in the HPI. All  pertinent positives and negatives as reviewed in the HPI. ____________________________________________   PHYSICAL EXAM:  VITAL SIGNS: ED Triage Vitals  Enc Vitals Group     BP 12/13/16 1111 106/70     Pulse Rate 12/13/16 1111 (!) 112     Resp 12/13/16 1111 18     Temp 12/13/16 1111 99.5 F (37.5 C)     Temp Source 12/13/16 1111 Oral     SpO2 12/13/16 1111 95 %     Weight 12/13/16 1110 175 lb (79.4 kg)     Height 12/13/16 1110 5' 4"  (1.626 m)     Head Circumference --      Peak Flow --      Pain Score 12/13/16 1110 8     Pain Loc --      Pain Edu? --      Excl. in [place]? --     Constitutional: Alert and oriented. Well appearing and in no acute distress. Eyes: Conjunctivae are normal. PERRL. EOMI. Head: Atraumatic. Nose: No congestion/rhinnorhea. Mouth/Throat: Mucous  membranes are moist.  Oropharynx non-erythematous. Neck: No stridor.  No meningeal signs.   Cardiovascular: tachtycardic rate, regular rhythm. Good peripheral circulation. Grossly normal heart sounds.   Respiratory: tachypneic respiratory effort.  No retractions. Lungs CTAB. Gastrointestinal: Soft and nontender. No distention.  Musculoskeletal: No lower extremity tenderness nor edema. No gross deformities of extremities. Mild right cva ttp. Neurologic:  Normal speech and language. No gross focal neurologic deficits are appreciated.  Skin:  Skin is warm, dry and intact. No rash noted.   ____________________________________________   LABS (all labs ordered are listed, but only abnormal results are displayed)  Labs Reviewed  CBC WITH DIFFERENTIAL/PLATELET - Abnormal; Notable for the following components:      Result Value   WBC 18.8 (*)    Neutro Abs 15.2 (*)    Monocytes Absolute 1.4 (*)    All other components within normal limits  BASIC METABOLIC PANEL - Abnormal; Notable for the following components:   Potassium 3.3 (*)    Glucose, Bld 128 (*)    All other components within normal limits  URINALYSIS, ROUTINE W REFLEX MICROSCOPIC - Abnormal; Notable for the following components:   Specific Gravity, Urine 1.035 (*)    All other components within normal limits  HEPATIC FUNCTION PANEL - Abnormal; Notable for the following components:   ALT 13 (*)    All other components within normal limits  INFLUENZA PANEL BY PCR (TYPE A & B)   ____________________________________________  EKG   EKG Interpretation  Date/Time:  Tuesday December 13 2016 14:45:03 EST Ventricular Rate:  92 PR Interval:    QRS Duration: 88 QT Interval:  400 QTC Calculation: 498 R Axis:   -133 Text Interpretation:  Right and left arm electrode reversal, interpretation assumes no reversal Sinus rhythm Ipoor tracing. Confirmed by Merrily Pew [phone]) on 12/13/2016 4:40:17 PM        ____________________________________________  RADIOLOGY  Dg Chest 2 View  Result Date: 12/13/2016 CLINICAL DATA:  Productive cough, chest and back pain, fever, and shortness of breath. History of COPD, former smoker. EXAM: CHEST  2 VIEW COMPARISON:  PA and lateral chest x-ray of October 03, 2012 FINDINGS: The lungs are adequately inflated. There are coarse lung markings in the left lower lobe. There is no pleural effusion or pneumothorax. The heart and pulmonary vascularity are normal. There is calcification in the wall of the aortic arch. The bony thorax exhibits no acute abnormality. IMPRESSION: Left lower lobe pneumonia.  Followup PA and lateral chest X-ray is recommended in 3-4 weeks following trial of antibiotic therapy to ensure resolution and exclude underlying malignancy. Thoracic aortic atherosclerosis. Electronically Signed   By: David  Martinique M.D.   On: 12/13/2016 11:57   Ct Chest W Contrast  Result Date: 12/13/2016 CLINICAL DATA:  Cough, fever. EXAM: CT CHEST WITH CONTRAST TECHNIQUE: Multidetector CT imaging of the chest was performed during intravenous contrast administration. CONTRAST:  68m ISOVUE-300 IOPAMIDOL (ISOVUE-300) INJECTION 61% COMPARISON:  Radiographs of same day.  CT scan of July 12, 2012. FINDINGS: Cardiovascular: Atherosclerosis of thoracic aorta is noted without aneurysm or dissection. Normal cardiac size. No pericardial effusion is noted. Mediastinum/Nodes: No enlarged mediastinal, hilar, or axillary lymph nodes. Thyroid gland, trachea, and esophagus demonstrate no significant findings. Lungs/Pleura: No pneumothorax or pleural effusion is noted. Mild emphysematous disease is noted in the upper lobes bilaterally. Right posterior basilar airspace opacity is noted concerning for pneumonia or atelectasis. Two ill-defined airspace opacities are noted in the right upper lobe with some degree of ground-glass opacity which may represent focal inflammation, but follow-up is  recommended to ensure resolution. Upper Abdomen: No acute abnormality. Musculoskeletal: No chest wall abnormality. No acute or significant osseous findings. IMPRESSION: Right posterior basilar airspace opacity is noted concerning for pneumonia or atelectasis. Two ill-defined densities are noted in the right upper lobe with some degree of associated ground-glass opacity which may represent focal inflammation, but followup unenhanced chest CT scan in 3 months is recommended to ensure resolution and rule out underlying neoplasm. Aortic Atherosclerosis (ICD10-I70.0) and Emphysema (ICD10-J43.9). Electronically Signed   By: JMarijo Conception M.D.   On: 12/13/2016 14:11    ____________________________________________   PROCEDURES  Procedure(s) performed:   Procedures   ____________________________________________   INITIAL IMPRESSION / ASSESSMENT AND PLAN / ED COURSE  Here with what seems to be community acquired pneumonia failing outpatient treatment.  Has many antibiotic allergies and Artie tried Levaquin so we will attempt azithromycin this time.  On multiple reevaluation patient is hypoxic on room air.  CT scan done to ensure that this was a pneumonia that just does not respond to antibiotics rather than a malignancy and it demonstrates pneumonia as well but needs a follow-up CT scan in a few months.   She ambulates without any hypoxia so suspect this could be just hypoventilation secondary to body habitus however with the pneumonia concern for possible hypoxia because of parenchymal infection and inflammation.     Pertinent labs & imaging results that were available during my care of the patient were reviewed by me and considered in my medical decision making (see chart for details).  ____________________________________________  FINAL CLINICAL IMPRESSION(S) / ED DIAGNOSES  Final diagnoses:  Hypoxia  Community acquired pneumonia of left lower lobe of lung (HCC)  Cough    Nonintractable headache, unspecified chronicity pattern, unspecified headache type     MEDICATIONS GIVEN DURING THIS VISIT:  Medications  sodium chloride 0.9 % bolus 1,000 mL (0 mLs Intravenous Stopped 12/13/16 1606)  ondansetron (ZOFRAN) 8 mg in sodium chloride 0.9 % 50 mL IVPB (0 mg Intravenous Stopped 12/13/16 1326)  azithromycin (ZITHROMAX) 500 mg in dextrose 5 % 250 mL IVPB (0 mg Intravenous Stopped 12/13/16 1503)  iopamidol (ISOVUE-300) 61 % injection 75 mL (75 mLs Intravenous Contrast Given 12/13/16 1351)  sodium chloride 0.9 % bolus 1,000 mL (1,000 mLs Intravenous New Bag/Given 12/13/16 1606)  acetaminophen (TYLENOL) tablet 1,000 mg (1,000 mg Oral Given 12/13/16 1616)  fentaNYL (SUBLIMAZE) injection 50 mcg (  50 mcg Intravenous Given 12/13/16 1617)     NEW OUTPATIENT MEDICATIONS STARTED DURING THIS VISIT:  This SmartLink is deprecated. Use AVSMEDLIST instead to display the medication list for a patient.  Note:  This note was prepared with assistance of Dragon voice recognition software. Occasional wrong-word or sound-a-like substitutions may have occurred due to the inherent limitations of voice recognition software.   Merrily Pew, MD 12/13/16 [phone]

## 2016-12-13 NOTE — ED Notes (Signed)
Pt had steady gate while ambulating. O2 stayed at 95%. Pt states she felt a little dizzy.

## 2016-12-14 DIAGNOSIS — R0902 Hypoxemia: Secondary | ICD-10-CM

## 2016-12-14 LAB — COMPREHENSIVE METABOLIC PANEL
ALBUMIN: 3 g/dL — AB (ref 3.5–5.0)
ALT: 10 U/L — AB (ref 14–54)
AST: 12 U/L — AB (ref 15–41)
Alkaline Phosphatase: 61 U/L (ref 38–126)
Anion gap: 5 (ref 5–15)
BILIRUBIN TOTAL: 0.7 mg/dL (ref 0.3–1.2)
BUN: 12 mg/dL (ref 6–20)
CHLORIDE: 106 mmol/L (ref 101–111)
CO2: 26 mmol/L (ref 22–32)
CREATININE: 0.67 mg/dL (ref 0.44–1.00)
Calcium: 8.2 mg/dL — ABNORMAL LOW (ref 8.9–10.3)
GFR calc Af Amer: >60 mL/min (ref >60)
GLUCOSE: 115 mg/dL — AB (ref 65–99)
Potassium: 3.7 mmol/L (ref 3.5–5.1)
Sodium: 137 mmol/L (ref 135–145)
Total Protein: 6.1 g/dL — ABNORMAL LOW (ref 6.5–8.1)

## 2016-12-14 LAB — CBC
HCT: 36.2 % (ref 36.0–46.0)
Hemoglobin: 11.4 g/dL — ABNORMAL LOW (ref 12.0–15.0)
MCH: 30.6 pg (ref 26.0–34.0)
MCHC: 31.5 g/dL (ref 30.0–36.0)
MCV: 97.3 fL (ref 78.0–100.0)
PLATELETS: 208 10*3/uL (ref 150–400)
RBC: 3.72 MIL/uL — ABNORMAL LOW (ref 3.87–5.11)
RDW: 12.4 % (ref 11.5–15.5)
WBC: 13.9 10*3/uL — AB (ref 4.0–10.5)

## 2016-12-14 LAB — EXPECTORATED SPUTUM ASSESSMENT W REFEX TO RESP CULTURE

## 2016-12-14 LAB — EXPECTORATED SPUTUM ASSESSMENT W GRAM STAIN, RFLX TO RESP C: Special Requests: NORMAL

## 2016-12-14 LAB — TROPONIN I: Troponin I: <0.03 ng/mL (ref <0.03)

## 2016-12-14 LAB — STREP PNEUMONIAE URINARY ANTIGEN: STREP PNEUMO URINARY ANTIGEN: NEGATIVE

## 2016-12-14 MED ORDER — HYDROCODONE-HOMATROPINE 5-1.5 MG/5ML PO SYRP: 5.0000 mL | ORAL_SOLUTION | Freq: Four times a day (QID) | ORAL | 0 refills | Status: AC | PRN

## 2016-12-14 MED ORDER — CALCIUM CARBONATE 1250 (500 CA) MG PO TABS
1.0000 | ORAL_TABLET | Freq: Every day | ORAL | Status: DC
  Administered 2016-12-14: 500 mg via ORAL
  Filled 2016-12-14: qty 1

## 2016-12-14 MED ORDER — LEVOFLOXACIN 750 MG PO TABS: 750.0000 mg | ORAL_TABLET | Freq: Every day | ORAL | 0 refills | Status: AC

## 2016-12-14 MED ORDER — PREDNISONE 20 MG PO TABS: 60.0000 mg | ORAL_TABLET | Freq: Every day | ORAL | 0 refills | Status: AC

## 2016-12-14 NOTE — Progress Notes (Signed)
Patient discharged home with personal belongings. IV removed and site intact. Patient discharged with prescriptions as well.

## 2016-12-14 NOTE — Discharge Summary (Signed)
Physician Discharge Summary  Desiree Hogan GNF:[id number] DOB: 09/02/1955 DOA: 12/13/2016  PCP: Glenda Chroman, MD  Admit date: 12/13/2016 Discharge date: 12/14/2016  Admitted From: Home Disposition: Home  Recommendations for Outpatient Follow-up:  1. Follow up with PCP in 1-2 weeks 2. Will need follow-up chest CT unenhanced CT in 3 months to rule out underlying neoplasm 3. Please obtain BMP/CBC in one week 4. Take medications as prescribed 5. Discussed possible lung function test with primary care physician at follow-up   Calvin: None Equipment/Devices: None  Discharge Condition: Stable CODE STATUS: Full code Diet recommendation: Heart healthy  Brief/Interim Summary: Desiree Hogan  is a 61 y.o. female, w Hypothyroidism, Dm2, Hyperlipidemia, Copd apparently c/o cough (dry) for a few days,  + subjective fever and chills, pt notes dyspnea, wheezing.  N/v x1 today.  Pt denies abd pain, diarrhea, brbpr, black stool.  Pt was recently on levaquin and prednisone and was feeling better,  then her medication finished and she felt worse. Pt was encouraged to come to ED by her family   In ED: CT chest IMPRESSION: Right posterior basilar airspace opacity is noted concerning for pneumonia or atelectasis.  Two ill-defined densities are noted in the right upper lobe with some degree of associated ground-glass opacity which may represent focal inflammation, but followup unenhanced chest CT scan in 3 months is recommended to ensure resolution and rule out underlying neoplasm.  Patient was in for overnight observation. On 12/14/2017 for reports improvement in her shortness of breath. She was ambulating without supplement oxygen pulse oximetry of 97% on room air. She was instructed to continue her antibiotics as well as steroids outpatient. She is also instructed to follow-up with her primary care physician within the next 7-10 days for further evaluation and follow-up. She will need a CT scan  in 3 months unenhanced follow-up findings on CT scan time of admission.    Discharge Diagnoses:  Principal Problem:   Community acquired pneumonia Active Problems:   Pneumonia   Leukocytosis   Hypokalemia   Diabetes mellitus without complication [facility])    Discharge Instructions  Discharge Instructions    Call MD for:  difficulty breathing, headache or visual disturbances   Complete by:  As directed    Call MD for:  extreme fatigue   Complete by:  As directed    Call MD for:  hives   Complete by:  As directed    Call MD for:  persistant dizziness or light-headedness   Complete by:  As directed    Call MD for:  persistant nausea and vomiting   Complete by:  As directed    Call MD for:  severe uncontrolled pain   Complete by:  As directed    Call MD for:  temperature >100.4   Complete by:  As directed    Diet - low sodium heart healthy   Complete by:  As directed    Discharge instructions   Complete by:  As directed    Take medications as prescribed Follow up with your PCP within 7-10days Need follow up unenhanced CT scan in 3 months   Increase activity slowly   Complete by:  As directed      Allergies as of 12/14/2016      Reactions   Bactrim [sulfamethoxazole-trimethoprim] Itching   And sores   Penicillins Anaphylaxis   Has patient had a PCN reaction causing immediate rash, facial/tongue/throat swelling, SOB or lightheadedness with hypotension: Yes Has patient had a PCN reaction  causing severe rash involving mucus membranes or skin necrosis: Yes Has patient had a PCN reaction that required hospitalization: No Has patient had a PCN reaction occurring within the last 10 years: No If all of the above answers are "NO", then may proceed with Cephalosporin use.   Tetracyclines & Related Swelling, Rash      Medication List    TAKE these medications   alendronate 70 MG tablet Commonly known as:  FOSAMAX Take 70 mg by mouth every 7 (seven) days. Take with a full glass  of water on an empty stomach.   BREO ELLIPTA 100-25 MCG/INH Aepb Generic drug:  fluticasone furoate-vilanterol Inhale 1 puff into the lungs daily.   calcium gluconate 500 MG tablet Take 600 mg by mouth daily.   cetirizine 10 MG tablet Commonly known as:  ZYRTEC Take 10 mg by mouth daily.   DULoxetine 30 MG capsule Commonly known as:  CYMBALTA Take 1 capsule (30 mg total) by mouth daily.   gabapentin 300 MG capsule Commonly known as:  NEURONTIN Take 300 mg by mouth 3 (three) times daily.   gemfibrozil 600 MG tablet Commonly known as:  LOPID Take 600 mg by mouth 2 (two) times daily before a meal.   HYDROcodone-homatropine 5-1.5 MG/5ML syrup Commonly known as:  HYCODAN Take 5 mLs by mouth every 6 (six) hours as needed for up to 3 days for cough.   ipratropium 0.06 % nasal spray Commonly known as:  ATROVENT Place 1 spray into both nostrils daily as needed for rhinitis.   levofloxacin 750 MG tablet Commonly known as:  LEVAQUIN Take 1 tablet (750 mg total) by mouth daily for 6 days.   levothyroxine 125 MCG tablet Commonly known as:  SYNTHROID, LEVOTHROID Take 125 mcg by mouth daily.   NYQUIL COLD & FLU PO Take 10-15 mLs by mouth daily as needed (for cold and flu).   predniSONE 20 MG tablet Commonly known as:  DELTASONE Take 3 tablets (60 mg total) by mouth daily with breakfast for 4 days. Start taking on:  12/15/2016 What changed:    medication strength  how much to take  when to take this  reasons to take this   PROAIR HFA 108 (90 Base) MCG/ACT inhaler Generic drug:  albuterol Inhale 1-2 puffs into the lungs every 6 (six) hours as needed for wheezing or shortness of breath.   tiZANidine 4 MG tablet Commonly known as:  ZANAFLEX TAKE 1 TABLET BY MOUTH EVERYDAY AT BEDTIME   traMADol 50 MG tablet Commonly known as:  ULTRAM Take 1 tablet (50 mg total) by mouth every 6 (six) hours as needed.      Follow-up Information    Vyas, Dhruv B, MD. Schedule an  appointment as soon as possible for a visit in 1 week(s).   Specialty:  Internal Medicine Contact information: [place] [address] [phone]          Allergies  Allergen Reactions  . Bactrim [Sulfamethoxazole-Trimethoprim] Itching    And sores  . Penicillins Anaphylaxis    Has patient had a PCN reaction causing immediate rash, facial/tongue/throat swelling, SOB or lightheadedness with hypotension: Yes Has patient had a PCN reaction causing severe rash involving mucus membranes or skin necrosis: Yes Has patient had a PCN reaction that required hospitalization: No Has patient had a PCN reaction occurring within the last 10 years: No If all of the above answers are "NO", then may proceed with Cephalosporin use.   . Tetracyclines & Related  Swelling and Rash    Consultations:  None   Procedures/Studies: Dg Chest 2 View  Result Date: 12/13/2016 CLINICAL DATA:  Productive cough, chest and back pain, fever, and shortness of breath. History of COPD, former smoker. EXAM: CHEST  2 VIEW COMPARISON:  PA and lateral chest x-ray of October 03, 2012 FINDINGS: The lungs are adequately inflated. There are coarse lung markings in the left lower lobe. There is no pleural effusion or pneumothorax. The heart and pulmonary vascularity are normal. There is calcification in the wall of the aortic arch. The bony thorax exhibits no acute abnormality. IMPRESSION: Left lower lobe pneumonia. Followup PA and lateral chest X-ray is recommended in 3-4 weeks following trial of antibiotic therapy to ensure resolution and exclude underlying malignancy. Thoracic aortic atherosclerosis. Electronically Signed   By: David  Martinique M.D.   On: 12/13/2016 11:57   Ct Chest W Contrast  Result Date: 12/13/2016 CLINICAL DATA:  Cough, fever. EXAM: CT CHEST WITH CONTRAST TECHNIQUE: Multidetector CT imaging of the chest was performed during intravenous contrast administration. CONTRAST:  55m ISOVUE-300 IOPAMIDOL  (ISOVUE-300) INJECTION 61% COMPARISON:  Radiographs of same day.  CT scan of July 12, 2012. FINDINGS: Cardiovascular: Atherosclerosis of thoracic aorta is noted without aneurysm or dissection. Normal cardiac size. No pericardial effusion is noted. Mediastinum/Nodes: No enlarged mediastinal, hilar, or axillary lymph nodes. Thyroid gland, trachea, and esophagus demonstrate no significant findings. Lungs/Pleura: No pneumothorax or pleural effusion is noted. Mild emphysematous disease is noted in the upper lobes bilaterally. Right posterior basilar airspace opacity is noted concerning for pneumonia or atelectasis. Two ill-defined airspace opacities are noted in the right upper lobe with some degree of ground-glass opacity which may represent focal inflammation, but follow-up is recommended to ensure resolution. Upper Abdomen: No acute abnormality. Musculoskeletal: No chest wall abnormality. No acute or significant osseous findings. IMPRESSION: Right posterior basilar airspace opacity is noted concerning for pneumonia or atelectasis. Two ill-defined densities are noted in the right upper lobe with some degree of associated ground-glass opacity which may represent focal inflammation, but followup unenhanced chest CT scan in 3 months is recommended to ensure resolution and rule out underlying neoplasm. Aortic Atherosclerosis (ICD10-I70.0) and Emphysema (ICD10-J43.9). Electronically Signed   By: JMarijo Conception M.D.   On: 12/13/2016 14:11     Subjective: Patient reports she feels markedly better today than she did at time of presentation to the emergency department.  She voiced that she feels confident she can go home on medication and follow-up with her primary care physician to ensure resolution of her shortness of breath.  Discharge Exam: Vitals:   12/14/16 1055 12/14/16 1306  BP:    Pulse:    Resp:    Temp: 98.4 F (36.9 C)   SpO2:  97%   Vitals:   12/14/16 1011 12/14/16 1035 12/14/16 1055 12/14/16  1306  BP:      Pulse: 81     Resp:      Temp:   98.4 F (36.9 C)   TempSrc:   Oral   SpO2: 94% 94%  97%  Weight:      Height:        General: Pt is alert, awake, not in acute distress Cardiovascular: RRR, S1/S2 +, no rubs, no gallops Respiratory:  mostly clear with scattered rales in the left lower lung fields, normal respiratory effort, no accessory muscle use Abdominal: Soft, obese, NT, ND, bowel sounds + Extremities: no edema, no cyanosis    The results of significant diagnostics  from this hospitalization (including imaging, microbiology, ancillary and laboratory) are listed below for reference.     Microbiology: Recent Results (from the past 240 hour(s))  Culture, blood (routine x 2) Call MD if unable to obtain prior to antibiotics being given     Status: None (Preliminary result)   Collection Time: 12/13/16  8:31 PM  Result Value Ref Range Status   Specimen Description RIGHT ANTECUBITAL  Final   Special Requests   Final    BOTTLES DRAWN AEROBIC AND ANAEROBIC Blood Culture adequate volume   Culture NO GROWTH < 12 HOURS  Final   Report Status PENDING  Incomplete  Culture, blood (routine x 2) Call MD if unable to obtain prior to antibiotics being given     Status: None (Preliminary result)   Collection Time: 12/13/16  8:31 PM  Result Value Ref Range Status   Specimen Description BLOOD LEFT HAND  Final   Special Requests   Final    BOTTLES DRAWN AEROBIC AND ANAEROBIC Blood Culture adequate volume   Culture NO GROWTH < 12 HOURS  Final   Report Status PENDING  Incomplete  Culture, sputum-assessment     Status: None   Collection Time: 12/14/16  6:37 AM  Result Value Ref Range Status   Specimen Description SPUTUM  Final   Special Requests Normal  Final   Sputum evaluation THIS SPECIMEN IS ACCEPTABLE FOR SPUTUM CULTURE  Final   Report Status 12/14/2016 FINAL  Final     Labs: BNP (last 3 results) No results for input(s): BNP in the last 8760 hours. Basic Metabolic  Panel: Recent Labs  Lab 12/13/16 1147 12/14/16 0608  NA 136 137  K 3.3* 3.7  CL 103 106  CO2 23 26  GLUCOSE 128* 115*  BUN 18 12  CREATININE 0.87 0.67  CALCIUM 9.3 8.2*   Liver Function Tests: Recent Labs  Lab 12/13/16 1151 12/14/16 0608  AST 18 12*  ALT 13* 10*  ALKPHOS 77 61  BILITOT 1.1 0.7  PROT 6.8 6.1*  ALBUMIN 3.6 3.0*   No results for input(s): LIPASE, AMYLASE in the last 168 hours. No results for input(s): AMMONIA in the last 168 hours. CBC: Recent Labs  Lab 12/13/16 1147 12/14/16 0608  WBC 18.8* 13.9*  NEUTROABS 15.2*  --   HGB 14.2 11.4*  HCT 43.4 36.2  MCV 95.2 97.3  PLT 270 208   Cardiac Enzymes: Recent Labs  Lab 12/13/16 2031 12/13/16 2358 12/14/16 0608  TROPONINI <0.03 <0.03 <0.03   BNP: Invalid input(s): POCBNP CBG: No results for input(s): GLUCAP in the last 168 hours. D-Dimer No results for input(s): DDIMER in the last 72 hours. Hgb A1c No results for input(s): HGBA1C in the last 72 hours. Lipid Profile No results for input(s): CHOL, HDL, LDLCALC, TRIG, CHOLHDL, LDLDIRECT in the last 72 hours. Thyroid function studies No results for input(s): TSH, T4TOTAL, T3FREE, THYROIDAB in the last 72 hours.  Invalid input(s): FREET3 Anemia work up No results for input(s): VITAMINB12, FOLATE, FERRITIN, TIBC, IRON, RETICCTPCT in the last 72 hours. Urinalysis    Component Value Date/Time   COLORURINE YELLOW 12/13/2016 1600   APPEARANCEUR CLEAR 12/13/2016 1600   LABSPEC 1.035 (H) 12/13/2016 1600   PHURINE 6.0 12/13/2016 1600   GLUCOSEU NEGATIVE 12/13/2016 1600   HGBUR NEGATIVE 12/13/2016 1600   BILIRUBINUR NEGATIVE 12/13/2016 1600   KETONESUR NEGATIVE 12/13/2016 1600   PROTEINUR NEGATIVE 12/13/2016 1600   NITRITE NEGATIVE 12/13/2016 1600   LEUKOCYTESUR NEGATIVE 12/13/2016 1600   Sepsis  Labs Invalid input(s): PROCALCITONIN,  WBC,  LACTICIDVEN Microbiology Recent Results (from the past 240 hour(s))  Culture, blood (routine x 2) Call  MD if unable to obtain prior to antibiotics being given     Status: None (Preliminary result)   Collection Time: 12/13/16  8:31 PM  Result Value Ref Range Status   Specimen Description RIGHT ANTECUBITAL  Final   Special Requests   Final    BOTTLES DRAWN AEROBIC AND ANAEROBIC Blood Culture adequate volume   Culture NO GROWTH < 12 HOURS  Final   Report Status PENDING  Incomplete  Culture, blood (routine x 2) Call MD if unable to obtain prior to antibiotics being given     Status: None (Preliminary result)   Collection Time: 12/13/16  8:31 PM  Result Value Ref Range Status   Specimen Description BLOOD LEFT HAND  Final   Special Requests   Final    BOTTLES DRAWN AEROBIC AND ANAEROBIC Blood Culture adequate volume   Culture NO GROWTH < 12 HOURS  Final   Report Status PENDING  Incomplete  Culture, sputum-assessment     Status: None   Collection Time: 12/14/16  6:37 AM  Result Value Ref Range Status   Specimen Description SPUTUM  Final   Special Requests Normal  Final   Sputum evaluation THIS SPECIMEN IS ACCEPTABLE FOR SPUTUM CULTURE  Final   Report Status 12/14/2016 FINAL  Final     Time coordinating discharge: 40 minutes SIGNED:   Loretha Stapler, MD  [facility] 12/14/2016, 1:49 PM Pager [phone] If 7PM-7AM, please contact night-coverage [email or web address] Password [email or web address]

## 2016-12-14 NOTE — Progress Notes (Signed)
Pharmacy Antibiotic Note  Desiree Hogan is a 61 y.o. female admitted on 12/13/2016 with pneumonia.  Pharmacy has been consulted for [facility] dosing.  Plan: Levaquin 756m IV q24hrs, switch to po when improved and appropriate Monitor labs, progress, c/s  Height: 5' 4"  (162.6 cm) Weight: 186 lb 15.2 oz (84.8 kg) IBW/kg (Calculated) : 54.7  Temp (24hrs), Avg:98.5 F (36.9 C), Min:98 F (36.7 C), Max:99.5 F (37.5 C)  Recent Labs  Lab 12/13/16 1147 12/14/16 0608  WBC 18.8* 13.9*  CREATININE 0.87 0.67    Estimated Creatinine Clearance: 77.8 mL/min (by C-G formula based on SCr of 0.67 mg/dL).    Allergies  Allergen Reactions  . Bactrim [Sulfamethoxazole-Trimethoprim] Itching    And sores  . Penicillins Anaphylaxis    Has patient had a PCN reaction causing immediate rash, facial/tongue/throat swelling, SOB or lightheadedness with hypotension: Yes Has patient had a PCN reaction causing severe rash involving mucus membranes or skin necrosis: Yes Has patient had a PCN reaction that required hospitalization: No Has patient had a PCN reaction occurring within the last 10 years: No If all of the above answers are "NO", then may proceed with Cephalosporin use.   . Tetracyclines & Related Swelling and Rash   Antimicrobials this admission: Zithromax 12/11 x 1 >>  Levaquin 12/12  >>   Dose adjustments this admission:  Microbiology results:  BCx: 12/11  pending  12/12 Sputum: pending   Thank you for allowing pharmacy to be a part of this patient's care.  HHart RobinsonsA 12/14/2016 10:36 AM

## 2016-12-14 NOTE — Progress Notes (Signed)
SATURATION QUALIFICATIONS: (This note is used to comply with regulatory documentation for home oxygen)  Patient Saturations on Room Air at Rest = 94%  Patient Saturations on Room Air while Ambulating = 97%  Patient does not need home oxygen.

## 2016-12-15 LAB — LEGIONELLA PNEUMOPHILA SEROGP 1 UR AG: L. pneumophila Serogp 1 Ur Ag: NEGATIVE

## 2016-12-15 LAB — HIV ANTIBODY (ROUTINE TESTING W REFLEX): HIV SCREEN 4TH GENERATION: NONREACTIVE

## 2016-12-16 LAB — CULTURE, RESPIRATORY W GRAM STAIN
Culture: NORMAL
Special Requests: NORMAL

## 2016-12-18 LAB — CULTURE, BLOOD (ROUTINE X 2)
CULTURE: NO GROWTH
CULTURE: NO GROWTH
Special Requests: ADEQUATE
Special Requests: ADEQUATE

## 2016-12-20 ENCOUNTER — Telehealth: Payer: Self-pay

## 2016-12-20 MED ORDER — DULOXETINE HCL 30 MG PO CPEP: 30.0000 mg | ORAL_CAPSULE | Freq: Every day | ORAL | 0 refills | Status: DC

## 2016-12-20 NOTE — Telephone Encounter (Signed)
Last Visit: 06/10/16 Next Visit: 01/12/17  Okay to refill per Dr. Estanislado Pandy

## 2016-12-20 NOTE — Telephone Encounter (Signed)
Patient would like a Rx refill on Duloxetine.  [facility] in [place].  CB# is [phone].  Please advise.  Thank you.

## 2016-12-20 NOTE — Addendum Note (Signed)
Addended by: Carole Binning on: 12/20/2016 02:51 PM   Modules accepted: Orders

## 2016-12-28 ENCOUNTER — Other Ambulatory Visit: Payer: Self-pay | Admitting: Rheumatology

## 2016-12-28 MED ORDER — TRAMADOL HCL 50 MG PO TABS: 50.0000 mg | ORAL_TABLET | Freq: Four times a day (QID) | ORAL | 0 refills | Status: DC | PRN

## 2016-12-28 NOTE — Telephone Encounter (Signed)
Patient called for a refill of Tramadol.  Her pharmacy is [facility] in [place].  Her CB#[phone]

## 2016-12-28 NOTE — Telephone Encounter (Signed)
ok 

## 2016-12-28 NOTE — Telephone Encounter (Signed)
Last Visit: 06/10/16 Next Visit: 01/12/17 UDS8/17/18 Narc Agreement: 01/12/16  Okay to refill Tramadol?

## 2017-01-01 NOTE — Progress Notes (Signed)
Office Visit Note  Patient: Desiree Hogan             Date of Birth: [birth year]           MRN: [id number]             PCP: Glenda Chroman, MD Referring: Glenda Chroman, MD Visit Date: 01/12/2017 Occupation: @GUAROCC @    Subjective:  Bilateral knee pain     History of Present Illness: Desiree Hogan is a 61 y.o. female with history of fibromyalgia, osteoporosis, and osteoarthritis.  She has been having increased generalized fibromyalgia pain due to the weather changes.  She is having significant trapezius muscle tension and tenderness.   She continues to take Cymbalta, Gabapentin, Zanaflex and Tramadol.  She states if her pain is a 8/10 the tramadol brings her down to a 5-6 out of 10. She continues to have insomnia.  She gets a maximum of 5 hours of sleep on a good night.  She has chronic SI joint pain.  No radiation of pain.  Her bilateral knees are also causing increased pain and occasionally swell.  She states her left knee is worse than the right.  She denies any recent injuries or falls.  She continues to take Fosamax for her osteoporosis.     Activities of Daily Living:  Patient reports morning stiffness for 45 minutes.   Patient Reports nocturnal pain.  Difficulty dressing/grooming: Reports Difficulty climbing stairs: Reports Difficulty getting out of chair: Reports Difficulty using hands for taps, buttons, cutlery, and/or writing: Reports   Review of Systems  Constitutional: Positive for fatigue. Negative for night sweats and weakness.  HENT: Negative for mouth sores, mouth dryness and nose dryness.   Eyes: Negative for redness and dryness.  Respiratory: Negative for cough, hemoptysis, shortness of breath and difficulty breathing.   Cardiovascular: Negative for chest pain, palpitations, hypertension and swelling in legs/feet.  Gastrointestinal: Negative for blood in stool, constipation and diarrhea.  Endocrine: Negative for increased urination.  Genitourinary: Negative for  painful urination.  Musculoskeletal: Positive for arthralgias, joint pain, joint swelling and morning stiffness. Negative for myalgias, muscle weakness, muscle tenderness and myalgias.  Skin: Negative for color change, pallor, rash, hair loss, nodules/bumps, redness, skin tightness, ulcers and sensitivity to sunlight.  Neurological: Negative for dizziness, numbness, headaches and night sweats.  Hematological: Positive for bruising/bleeding tendency.  Psychiatric/Behavioral: Negative for depressed mood and sleep disturbance. The patient is not nervous/anxious.     PMFS History:  Patient Active Problem List   Diagnosis Date Noted  . Community acquired pneumonia 12/13/2016  . Pneumonia 12/13/2016  . Leukocytosis 12/13/2016  . Hypokalemia 12/13/2016  . Diabetes mellitus without complication ([place]) 81/82/9937    Past Medical History:  Diagnosis Date  . Abdominal pain   . Anemia   . Back pain   . Colitis   . COPD (chronic obstructive pulmonary disease) (HCC)    not on home o2  . Crohn's disease ([place]) 03/29/12  . Diabetes mellitus without complication ([place])   . Fibromyalgia   . H/O breast biopsy   . Hip pain   . Hyperlipidemia   . Irritable bowel syndrome   . Joint pain   . Nonspecific abnormal finding in stool contents   . Peripheral vascular disease ([place])   . Ulcer    Mouth    Family History  Problem Relation Age of Onset  . Cancer Mother        Colon or vaginal ?  . Deep  vein thrombosis Mother   . Hypertension Father   . Diabetes Father   . Heart disease Father        Heart Disease before age 4  . Diabetes Sister   . Diabetes Brother   . Hypertension Brother    Past Surgical History:  Procedure Laterality Date  . CESAREAN SECTION  06/24/82  . CHOLECYSTECTOMY  1993   Gall Bladder  . COLONOSCOPY  Jan. 31, 2014  . [place] SURGERY  2010 and 2011  . TONSILLECTOMY     Social History   Social History Narrative  . Not on file     Objective: Vital Signs: BP (!)  150/85 (BP Location: Left Arm, Patient Position: Sitting, Cuff Size: Normal)   Pulse 91   Resp 14   Ht 5' 4"  (1.626 m)   Wt 185 lb (83.9 kg)   BMI 31.76 kg/m    Physical Exam  Constitutional: She is oriented to person, place, and time. She appears well-developed and well-nourished.  HENT:  Head: Normocephalic and atraumatic.  Eyes: Conjunctivae and EOM are normal.  Neck: Normal range of motion.  Cardiovascular: Normal rate, regular rhythm, normal heart sounds and intact distal pulses.  Pulmonary/Chest: Effort normal and breath sounds normal.  Abdominal: Soft. Bowel sounds are normal.  Lymphadenopathy:    She has no cervical adenopathy.  Neurological: She is alert and oriented to person, place, and time.  Skin: Skin is warm and dry. Capillary refill takes less than 2 seconds.  Psychiatric: She has a normal mood and affect. Her behavior is normal.  Nursing note and vitals reviewed.    Musculoskeletal Exam: C-spine, thoracic, and lumbar spine good ROM.  Tenderness of left SI joint.  No midline spinal tenderness.  Shoulder joints, elbow joints, wrist joints, MCPs, PIPs, and DIPs good ROM with no synovitis.  Hip joints, knee joints, ankle joints, MTPs, PIPs, and DIPs good ROM with no synovitis.  No warmth or effusion of knees.  Mild crepitus of left knee.  Trapezius muscle tenderness bilaterally.  18 out of 18 tender points.  Generalized hyperalgesia on exam.   CDAI Exam: No CDAI exam completed.    Investigation: No additional findings. UDS8/17/18 Narc Agreement: 01/12/16 CBC Latest Ref Rng & Units 12/14/2016 12/13/2016 06/10/2016  WBC 4.0 - 10.5 K/uL 13.9(H) 18.8(H) 7.6  Hemoglobin 12.0 - 15.0 g/dL 11.4(L) 14.2 12.6  Hematocrit 36.0 - 46.0 % 36.2 43.4 38.5  Platelets 150 - 400 K/uL 208 270 259   CMP Latest Ref Rng & Units 12/14/2016 12/13/2016 06/10/2016  Glucose 65 - 99 mg/dL 115(H) 128(H) 106(H)  BUN 6 - 20 mg/dL 12 18 14   Creatinine 0.44 - 1.00 mg/dL 0.67 0.87 0.75  Sodium 135 -  145 mmol/L 137 136 140  Potassium 3.5 - 5.1 mmol/L 3.7 3.3(L) 4.5  Chloride 101 - 111 mmol/L 106 103 104  CO2 22 - 32 mmol/L 26 23 24   Calcium 8.9 - 10.3 mg/dL 8.2(L) 9.3 9.9  Total Protein 6.5 - 8.1 g/dL 6.1(L) 6.8 7.0  Total Bilirubin 0.3 - 1.2 mg/dL 0.7 1.1 0.5  Alkaline Phos 38 - 126 U/L 61 77 76  AST 15 - 41 U/L 12(L) 18 18  ALT 14 - 54 U/L 10(L) 13(L) 13    Imaging: Xr Knee 3 View Left  Result Date: 01/12/2017 Medial compartment narrowing and moderate patellofemoral joint space narrowing. Intercondylar osteophytes noted. Impression: Moderate osteoarthritis and moderate chondromalacia patella.  Xr Knee 3 View Right  Result Date: 01/12/2017 Medial compartment  narrowing. Lateral and intercondylar osteophytes. Moderate patellofemoral compartment narrowing. Impression: Moderate osteoarthritis and chondromalacia patella    Speciality Comments: No specialty comments available.    Procedures:  Trigger Point Inj Date/Time: 01/12/2017 4:07 PM Performed by: Bo Merino, MD Authorized by: Bo Merino, MD   Consent Given by:  Patient Site marked: the procedure site was marked   Timeout: prior to procedure the correct patient, procedure, and site was verified   Indications:  Therapeutic Total # of Trigger Points:  2 Location: neck   Needle Size:  27 G Approach:  Dorsal Medications #1:  1 mL lidocaine 1 %; 10 mg triamcinolone acetonide 40 MG/ML Medications #2:  1 mL lidocaine 1 %; 10 mg triamcinolone acetonide 40 MG/ML   Allergies: Bactrim [sulfamethoxazole-trimethoprim]; Penicillins; and Tetracyclines & related   Assessment / Plan:     Visit Diagnoses: Fibromyalgia -Generalized hyperalgesia on exam. tramadol UDS8/17/18, Narc Agreement updated today.  She has been having increased generalized pain due to weather changes.  She has trapezius muscle tenderness and spasms.  She had bilateral cortisone trigger point injections performed today.  She will continue taking  Gabapentin, Cymbalta, Zanaflex, and Tramadol.    Trapezius muscle spasm: Bilateral cortisone trigger point injections were performed today.  She tolerated the procedure well.    ANA positive - +RF, -CCP  Primary osteoarthritis of both knees: She has discomfort in bilateral knees, worse in the left.  She has no warmth or effusion on exam.  Mild crepitus of left knee.  A handout for knee exercises was provided.  Discussed the option of cortisone injections in the future.    Chronic pain of both knees - She is having bilateral knee pain.  X-rays revealed moderate osteoarthritis and chondromalacia patella.  A handout for knee exercises was provided to the patient. Plan: XR KNEE 3 VIEW RIGHT, XR KNEE 3 VIEW LEFT  DDD (degenerative disc disease), lumbar - s/p fusion: Chronic pain. Left SI joint tenderness.  No radiation of pain.   Age-related osteoporosis without current pathological fracture - fosamax 70 mg weekly  Other insomnia: She continues to have interrupted sleep.  We discussed good sleep hygiene.   Other medical conditions are listed as follows:   Hypermobility of joint  History of IBS  History of cholecystectomy  History of hypercholesterolemia  History of COPD  History of Crohn's disease   Orders: Orders Placed This Encounter  Procedures  . Trigger Point Inj  . XR KNEE 3 VIEW RIGHT  . XR KNEE 3 VIEW LEFT   No orders of the defined types were placed in this encounter.    Follow-Up Instructions: Return in about 6 months (around 07/12/2017) for Fibromyalgia, Osteoarthritis.   Bo Merino, MD  Note - This record has been created using Editor, commissioning.  Chart creation errors have been sought, but may not always  have been located. Such creation errors do not reflect on  the standard of medical care.

## 2017-01-12 ENCOUNTER — Ambulatory Visit (INDEPENDENT_AMBULATORY_CARE_PROVIDER_SITE_OTHER): Payer: No Typology Code available for payment source | Admitting: Rheumatology

## 2017-01-12 ENCOUNTER — Ambulatory Visit (INDEPENDENT_AMBULATORY_CARE_PROVIDER_SITE_OTHER): Payer: Self-pay

## 2017-01-12 ENCOUNTER — Encounter: Payer: Self-pay | Admitting: Rheumatology

## 2017-01-12 ENCOUNTER — Ambulatory Visit (INDEPENDENT_AMBULATORY_CARE_PROVIDER_SITE_OTHER): Payer: No Typology Code available for payment source

## 2017-01-12 VITALS — BP 150/85 | HR 91 | Resp 14 | Ht 64.0 in | Wt 185.0 lb

## 2017-01-12 DIAGNOSIS — M5136 Other intervertebral disc degeneration, lumbar region: Secondary | ICD-10-CM

## 2017-01-12 DIAGNOSIS — M62838 Other muscle spasm: Secondary | ICD-10-CM | POA: Diagnosis not present

## 2017-01-12 DIAGNOSIS — M797 Fibromyalgia: Secondary | ICD-10-CM | POA: Diagnosis not present

## 2017-01-12 DIAGNOSIS — M170 Bilateral primary osteoarthritis of knee: Secondary | ICD-10-CM

## 2017-01-12 DIAGNOSIS — M25562 Pain in left knee: Secondary | ICD-10-CM

## 2017-01-12 DIAGNOSIS — M810 Age-related osteoporosis without current pathological fracture: Secondary | ICD-10-CM

## 2017-01-12 DIAGNOSIS — M51369 Other intervertebral disc degeneration, lumbar region without mention of lumbar back pain or lower extremity pain: Secondary | ICD-10-CM

## 2017-01-12 DIAGNOSIS — M249 Joint derangement, unspecified: Secondary | ICD-10-CM | POA: Diagnosis not present

## 2017-01-12 DIAGNOSIS — M25561 Pain in right knee: Secondary | ICD-10-CM

## 2017-01-12 DIAGNOSIS — Z8709 Personal history of other diseases of the respiratory system: Secondary | ICD-10-CM

## 2017-01-12 DIAGNOSIS — G8929 Other chronic pain: Secondary | ICD-10-CM

## 2017-01-12 DIAGNOSIS — G4709 Other insomnia: Secondary | ICD-10-CM | POA: Diagnosis not present

## 2017-01-12 DIAGNOSIS — Z8639 Personal history of other endocrine, nutritional and metabolic disease: Secondary | ICD-10-CM

## 2017-01-12 DIAGNOSIS — R768 Other specified abnormal immunological findings in serum: Secondary | ICD-10-CM | POA: Diagnosis not present

## 2017-01-12 DIAGNOSIS — Z9049 Acquired absence of other specified parts of digestive tract: Secondary | ICD-10-CM

## 2017-01-12 DIAGNOSIS — Z8719 Personal history of other diseases of the digestive system: Secondary | ICD-10-CM | POA: Diagnosis not present

## 2017-01-12 MED ORDER — LIDOCAINE HCL 1 % IJ SOLN
1.0000 mL | INTRAMUSCULAR | Status: AC | PRN
  Administered 2017-01-12: 1 mL

## 2017-01-12 MED ORDER — TRIAMCINOLONE ACETONIDE 40 MG/ML IJ SUSP
10.0000 mg | INTRAMUSCULAR | Status: AC | PRN
  Administered 2017-01-12: 10 mg via INTRAMUSCULAR

## 2017-01-12 NOTE — Patient Instructions (Signed)

## 2017-01-23 ENCOUNTER — Other Ambulatory Visit: Payer: Self-pay | Admitting: Rheumatology

## 2017-01-23 MED ORDER — TRAMADOL HCL 50 MG PO TABS: 50.0000 mg | ORAL_TABLET | Freq: Four times a day (QID) | ORAL | 0 refills | Status: DC | PRN

## 2017-01-23 NOTE — Telephone Encounter (Signed)
Patient called to request prescription refill for Tramadol.  Patient uses [facility] in [place].  Patient's CB# [phone]

## 2017-01-23 NOTE — Telephone Encounter (Signed)
Last Visit: 01/12/17 Next Visit: 07/20/17 UDS: 08/19/16 Narc Agreement: 01/12/16  Okay to refill Tramadol?

## 2017-01-23 NOTE — Telephone Encounter (Signed)
Ok to refill on 01/28/17.

## 2017-01-24 ENCOUNTER — Telehealth: Payer: Self-pay | Admitting: Rheumatology

## 2017-01-24 NOTE — Telephone Encounter (Signed)
Patient called to let the office know that she is out of her Tramadol.  She took her last pill yesterday.  Her CB# [phone]

## 2017-01-24 NOTE — Telephone Encounter (Signed)
Left message to advise patient will send in prescription refill on Friday as it can not be filled before then per Dr. Estanislado Pandy.

## 2017-02-03 ENCOUNTER — Other Ambulatory Visit: Payer: Self-pay | Admitting: Rheumatology

## 2017-02-03 NOTE — Telephone Encounter (Signed)
Last Visit: 01/12/17 Next Visit: 07/20/17  Okay to refill per Dr. Estanislado Pandy

## 2017-02-27 ENCOUNTER — Telehealth: Payer: Self-pay | Admitting: Rheumatology

## 2017-02-27 MED ORDER — TRAMADOL HCL 50 MG PO TABS: 50.0000 mg | ORAL_TABLET | Freq: Four times a day (QID) | ORAL | 0 refills | Status: DC | PRN

## 2017-02-27 NOTE — Telephone Encounter (Signed)
Last Visit: 01/12/17 Next Visit: 07/20/17 UDS: 08/19/16 Narc Agreement: 01/12/16  Okay to refill Tramadol?

## 2017-02-27 NOTE — Telephone Encounter (Signed)
Patient request a refill on Tramadol. [facility] [place]

## 2017-03-03 DIAGNOSIS — E7800 Pure hypercholesterolemia, unspecified: Secondary | ICD-10-CM | POA: Diagnosis not present

## 2017-03-03 DIAGNOSIS — Z87891 Personal history of nicotine dependence: Secondary | ICD-10-CM | POA: Diagnosis not present

## 2017-03-03 DIAGNOSIS — E039 Hypothyroidism, unspecified: Secondary | ICD-10-CM | POA: Diagnosis not present

## 2017-03-03 DIAGNOSIS — Z299 Encounter for prophylactic measures, unspecified: Secondary | ICD-10-CM | POA: Diagnosis not present

## 2017-03-03 DIAGNOSIS — K5090 Crohn's disease, unspecified, without complications: Secondary | ICD-10-CM | POA: Diagnosis not present

## 2017-03-03 DIAGNOSIS — J329 Chronic sinusitis, unspecified: Secondary | ICD-10-CM | POA: Diagnosis not present

## 2017-03-03 DIAGNOSIS — Z6832 Body mass index (BMI) 32.0-32.9, adult: Secondary | ICD-10-CM | POA: Diagnosis not present

## 2017-03-03 DIAGNOSIS — E1165 Type 2 diabetes mellitus with hyperglycemia: Secondary | ICD-10-CM | POA: Diagnosis not present

## 2017-03-17 ENCOUNTER — Other Ambulatory Visit: Payer: Self-pay | Admitting: Rheumatology

## 2017-03-21 ENCOUNTER — Other Ambulatory Visit: Payer: Self-pay | Admitting: Rheumatology

## 2017-03-23 ENCOUNTER — Telehealth: Payer: Self-pay | Admitting: Rheumatology

## 2017-03-23 ENCOUNTER — Other Ambulatory Visit: Payer: Self-pay | Admitting: Rheumatology

## 2017-03-23 DIAGNOSIS — Z6833 Body mass index (BMI) 33.0-33.9, adult: Secondary | ICD-10-CM | POA: Diagnosis not present

## 2017-03-23 DIAGNOSIS — Z299 Encounter for prophylactic measures, unspecified: Secondary | ICD-10-CM | POA: Diagnosis not present

## 2017-03-23 DIAGNOSIS — E1165 Type 2 diabetes mellitus with hyperglycemia: Secondary | ICD-10-CM | POA: Diagnosis not present

## 2017-03-23 DIAGNOSIS — R911 Solitary pulmonary nodule: Secondary | ICD-10-CM | POA: Diagnosis not present

## 2017-03-23 DIAGNOSIS — D509 Iron deficiency anemia, unspecified: Secondary | ICD-10-CM | POA: Diagnosis not present

## 2017-03-23 DIAGNOSIS — M797 Fibromyalgia: Secondary | ICD-10-CM | POA: Diagnosis not present

## 2017-03-23 NOTE — Telephone Encounter (Signed)
Patient advised to contact the pharmacy as a prescription  Had been sent to the pharmacy on 02/03/17 for a 90 day supply with 1 additional refill. Patient states she will do so because she has not picked that prescription up.

## 2017-03-23 NOTE — Telephone Encounter (Signed)
Patient called requesting prescription refill of Duloxetine.  [facility] [address] [facility] [place].

## 2017-03-24 NOTE — Telephone Encounter (Signed)
Please call the pharmacy for patients Cymbalta rx.

## 2017-03-24 NOTE — Telephone Encounter (Signed)
Please call pharmacy in reference to patients generic Cymbalta refill.

## 2017-03-24 NOTE — Telephone Encounter (Signed)
Prescription sent to the pharmacy.

## 2017-03-27 ENCOUNTER — Other Ambulatory Visit (HOSPITAL_COMMUNITY): Payer: Self-pay | Admitting: Internal Medicine

## 2017-03-27 DIAGNOSIS — R911 Solitary pulmonary nodule: Secondary | ICD-10-CM

## 2017-03-30 ENCOUNTER — Telehealth: Payer: Self-pay | Admitting: Rheumatology

## 2017-03-30 NOTE — Telephone Encounter (Signed)
Attempted to contact patient and left message for patient to call the office. Patient needs to update UDS.

## 2017-03-30 NOTE — Telephone Encounter (Signed)
Patient called requesting prescription refill of Tramadol.  [facility] [place]

## 2017-03-31 ENCOUNTER — Other Ambulatory Visit: Payer: Self-pay | Admitting: *Deleted

## 2017-03-31 ENCOUNTER — Other Ambulatory Visit: Payer: Self-pay

## 2017-03-31 ENCOUNTER — Other Ambulatory Visit: Payer: Self-pay | Admitting: Rheumatology

## 2017-03-31 DIAGNOSIS — Z5181 Encounter for therapeutic drug level monitoring: Secondary | ICD-10-CM

## 2017-03-31 NOTE — Telephone Encounter (Signed)
Patient request a refill on Tramadol. [facility] [place]

## 2017-04-03 LAB — PAIN MGMT, TRAMADOL W/MEDMATCH, U
DESMETHYLTRAMADOL: 1288 ng/mL — AB (ref <100)
Tramadol: 5819 ng/mL — ABNORMAL HIGH (ref <100)

## 2017-04-03 LAB — PAIN MGMT, PROFILE 5 W/CONF, U
AMPHETAMINES: NEGATIVE ng/mL (ref <500)
Barbiturates: NEGATIVE ng/mL (ref <300)
Benzodiazepines: NEGATIVE ng/mL (ref <100)
COCAINE METABOLITE: NEGATIVE ng/mL (ref <150)
CREATININE: 29.6 mg/dL
MARIJUANA METABOLITE: NEGATIVE ng/mL (ref <20)
Methadone Metabolite: NEGATIVE ng/mL (ref <100)
OXIDANT: NEGATIVE ug/mL (ref <200)
Opiates: NEGATIVE ng/mL (ref <100)
Oxycodone: NEGATIVE ng/mL (ref <100)
PH: 6.99 (ref 4.5–9.0)

## 2017-04-03 MED ORDER — TRAMADOL HCL 50 MG PO TABS: 50.0000 mg | ORAL_TABLET | Freq: Four times a day (QID) | ORAL | 0 refills | Status: DC | PRN

## 2017-04-03 NOTE — Telephone Encounter (Signed)
Last Visit:01/12/17 Next Visit: 07/20/17 UDS: 03/31/17 Narc agreement: 01/22/17  Okay to refill Tramadol?

## 2017-04-03 NOTE — Telephone Encounter (Signed)
ok 

## 2017-04-11 ENCOUNTER — Ambulatory Visit (HOSPITAL_COMMUNITY)
Admission: RE | Admit: 2017-04-11 | Discharge: 2017-04-11 | Disposition: A | Discharge status: Home / Self Care | Payer: 59 | Source: Ambulatory Visit | Attending: Internal Medicine | Admitting: Internal Medicine

## 2017-04-11 DIAGNOSIS — J439 Emphysema, unspecified: Secondary | ICD-10-CM | POA: Diagnosis not present

## 2017-04-11 DIAGNOSIS — I700 Atherosclerosis of aorta: Secondary | ICD-10-CM | POA: Diagnosis not present

## 2017-04-11 DIAGNOSIS — J984 Other disorders of lung: Secondary | ICD-10-CM | POA: Diagnosis not present

## 2017-04-11 DIAGNOSIS — R911 Solitary pulmonary nodule: Secondary | ICD-10-CM | POA: Diagnosis not present

## 2017-05-04 ENCOUNTER — Telehealth: Payer: Self-pay | Admitting: Rheumatology

## 2017-05-04 MED ORDER — TRAMADOL HCL 50 MG PO TABS: 50.0000 mg | ORAL_TABLET | Freq: Four times a day (QID) | ORAL | 0 refills | Status: DC | PRN

## 2017-05-04 NOTE — Telephone Encounter (Signed)
Patient request a refill on [facility] [place]

## 2017-05-04 NOTE — Telephone Encounter (Signed)
Last Visit:01/12/17 Next Visit: 07/20/17 UDS: 03/31/17 Narc agreement: 01/22/17  Okay to refill Tramadol?

## 2017-05-22 DIAGNOSIS — R062 Wheezing: Secondary | ICD-10-CM | POA: Diagnosis not present

## 2017-05-22 DIAGNOSIS — R05 Cough: Secondary | ICD-10-CM | POA: Diagnosis not present

## 2017-05-22 DIAGNOSIS — J309 Allergic rhinitis, unspecified: Secondary | ICD-10-CM | POA: Diagnosis not present

## 2017-05-22 DIAGNOSIS — J4540 Moderate persistent asthma, uncomplicated: Secondary | ICD-10-CM | POA: Diagnosis not present

## 2017-06-01 ENCOUNTER — Other Ambulatory Visit: Payer: Self-pay | Admitting: Rheumatology

## 2017-06-01 NOTE — Telephone Encounter (Signed)
Last Visit:01/12/17 Next Visit: 07/20/17  Okay to refill per Dr. Estanislado Pandy

## 2017-06-05 ENCOUNTER — Telehealth: Payer: Self-pay | Admitting: Rheumatology

## 2017-06-05 MED ORDER — TRAMADOL HCL 50 MG PO TABS: 50.0000 mg | ORAL_TABLET | Freq: Four times a day (QID) | ORAL | 0 refills | Status: DC | PRN

## 2017-06-05 NOTE — Telephone Encounter (Signed)
Patient called requesting prescription refill of Tramad[facility] [place].

## 2017-06-05 NOTE — Telephone Encounter (Signed)
Last Visit:01/12/17 Next Visit: 07/20/17 UDS: 03/31/17 Narc Agreement: 01/22/17  Okay to refill Tramadol?

## 2017-06-14 ENCOUNTER — Other Ambulatory Visit: Payer: Self-pay | Admitting: Rheumatology

## 2017-06-14 NOTE — Telephone Encounter (Signed)
Last Visit:01/12/17 Next Visit: 07/20/17  Okay to refill per Dr. Estanislado Pandy

## 2017-07-04 NOTE — Progress Notes (Signed)
Office Visit Note  Patient: Desiree Hogan             Date of Birth: October 12, 1955           MRN: [id number]             PCP: Glenda Chroman, MD Referring: Glenda Chroman, MD Visit Date: 07/14/2017 Occupation: @GUAROCC @    Subjective:   pain in multiple joints.   History of Present Illness: Desiree Hogan is a 62 y.o. female with history of fibromyalgia, osteoarthritis, DDD and Crohn's disease.  She states she is having a flare of Crohn's disease.  Her gastroenterologist has left and she is trying to establish with a new gastroenterologist.  She is having a flare of fibromyalgia with increased pain all over.  She denies any joint swelling.  Knee joint pain is tolerable.  Lower back continues to hurt.  Activities of Daily Living:  Patient reports morning stiffness for 30 minutes.   Patient Reports nocturnal pain.  Difficulty dressing/grooming: Denies Difficulty climbing stairs: Reports Difficulty getting out of chair: Reports Difficulty using hands for taps, buttons, cutlery, and/or writing: Reports   Review of Systems  Constitutional: Positive for fatigue. Negative for night sweats, weight gain and weight loss.  HENT: Negative for mouth sores, trouble swallowing, trouble swallowing, mouth dryness and nose dryness.   Eyes: Negative for pain, redness, visual disturbance and dryness.  Respiratory: Negative for cough, shortness of breath and difficulty breathing.   Cardiovascular: Negative for chest pain, palpitations, hypertension, irregular heartbeat and swelling in legs/feet.  Gastrointestinal: Positive for abdominal pain, constipation and diarrhea. Negative for blood in stool.  Endocrine: Negative for increased urination.  Genitourinary: Negative for decreased urine output, painful urination, pelvic pain and vaginal dryness.  Musculoskeletal: Positive for arthralgias, joint pain and morning stiffness. Negative for joint swelling, myalgias, muscle weakness, muscle tenderness and  myalgias.  Skin: Negative for color change, rash, hair loss, skin tightness, ulcers and sensitivity to sunlight.  Allergic/Immunologic: Negative for susceptible to infections.  Neurological: Positive for headaches. Negative for dizziness, light-headedness, memory loss, night sweats and weakness.  Hematological: Negative for bruising/bleeding tendency and swollen glands.  Psychiatric/Behavioral: Negative for depressed mood, confusion and sleep disturbance. The patient is not nervous/anxious.     PMFS History:  Patient Active Problem List   Diagnosis Date Noted  . Fibromyalgia 07/14/2017  . ANA positive 07/14/2017  . Primary osteoarthritis of both knees 07/14/2017  . DDD (degenerative disc disease), lumbar 07/14/2017  . Hypermobility of joint 07/14/2017  . Age-related osteoporosis without current pathological fracture 07/14/2017  . Other insomnia 07/14/2017  . History of Crohn's disease 07/14/2017  . History of IBS 07/14/2017  . History of hypercholesterolemia 07/14/2017  . History of COPD 07/14/2017  . History of cholecystectomy 07/14/2017  . Community acquired pneumonia 12/13/2016  . Pneumonia 12/13/2016  . Leukocytosis 12/13/2016  . Hypokalemia 12/13/2016  . Diabetes mellitus without complication ([place]) 35/32/9924    Past Medical History:  Diagnosis Date  . Abdominal pain   . Anemia   . Back pain   . Colitis   . COPD (chronic obstructive pulmonary disease) (H[facility])    not on home o2  . Crohn's disease ([place]) 03/29/12  . Diabetes mellitus without complication ([place])   . Fibromyalgia   . H/O breast biopsy   . Hip pain   . Hyperlipidemia   . Irritable bowel syndrome   . Joint pain   . Nonspecific abnormal finding in stool contents   .  Peripheral vascular disease ([place])   . Ulcer    Mouth    Family History  Problem Relation Age of Onset  . Cancer Mother        Colon or vaginal ?  . Deep vein thrombosis Mother   . Hypertension Father   . Diabetes Father   . Heart disease  Father        Heart Disease before age 60  . Diabetes Sister   . Diabetes Brother   . Hypertension Brother    Past Surgical History:  Procedure Laterality Date  . CESAREAN SECTION  06/24/82  . CHOLECYSTECTOMY  1993   Gall Bladder  . COLONOSCOPY  Jan. 31, 2014  . [place] SURGERY  2010 and 2011  . TONSILLECTOMY     Social History   Social History Narrative  . Not on file     Objective: Vital Signs: BP (!) 154/88 (BP Location: Left Arm, Patient Position: Sitting, Cuff Size: Normal)   Pulse 78   Resp 15   Ht 5' 4"  (1.626 m)   Wt 190 lb (86.2 kg)   BMI 32.61 kg/m    Physical Exam  Constitutional: She is oriented to person, place, and time. She appears well-developed and well-nourished.  HENT:  Head: Normocephalic and atraumatic.  Eyes: Conjunctivae and EOM are normal.  Neck: Normal range of motion.  Cardiovascular: Normal rate, regular rhythm, normal heart sounds and intact distal pulses.  Pulmonary/Chest: Effort normal and breath sounds normal.  Abdominal: Soft. Bowel sounds are normal.  Lymphadenopathy:    She has no cervical adenopathy.  Neurological: She is alert and oriented to person, place, and time.  Skin: Skin is warm and dry. Capillary refill takes less than 2 seconds.  Psychiatric: She has a normal mood and affect. Her behavior is normal.  Nursing note and vitals reviewed.    Musculoskeletal Exam: C-spine thoracic lumbar spine limited range of motion with discomfort.  Shoulder joints elbow joints wrist joint MCPs PIPs DIPs are in good range of motion with no synovitis.  Hip joints knee joints ankles MTPs PIPs were in good range of motion with no synovitis.  She has generalized hyperalgesia and positive tender points.  CDAI Exam: No CDAI exam completed.    Investigation: No additional findings.   Imaging: No results found.  Speciality Comments: No specialty comments available.    Procedures:  No procedures performed Allergies: Bactrim  [sulfamethoxazole-trimethoprim]; Penicillins; and Tetracyclines & related   Assessment / Plan:     Visit Diagnoses: Fibromyalgia -she continues to have generalized pain and discomfort.  She states the tramadol makes her pain more manageable.  Need for regular exercise and good sleep hygiene was discussed.  ANA positive - +RF, -CCP.  She has no clinical features of inflammatory arthritis.  Primary osteoarthritis of both knees-chronic pain.  DDD (degenerative disc disease), lumbar-she does have chronic pain which is manageable with tramadol.  Hypermobility of joint-isometric exercises discussed.  Age-related osteoporosis without current pathological fracture - fosamax 70 mg weekly  Other insomnia  History of Crohn's disease-she is having a Crohn's flare with increased bloating and diarrhea.  I have advised her to schedule appointment with gastroenterologist.  History of IBS  History of hypercholesterolemia  History of COPD  History of cholecystectomy  Medication monitoring encounter - Plan: Pain Mgmt, Tramadol w/medMATCH, U, Pain Mgmt, Profile 5 w/Conf, U    Orders: Orders Placed This Encounter  Procedures  . Pain Mgmt, Tramadol w/medMATCH, U  . Pain Mgmt, Profile 5  w/Conf, U   No orders of the defined types were placed in this encounter.   Face-to-face time spent with patient was 30 minutes. Greater than 50% of time was spent in counseling and coordination of care.  Follow-Up Instructions: Return in about 6 months (around 01/14/2018) for FMS OA .   Bo Merino, MD  Note - This record has been created using Editor, commissioning.  Chart creation errors have been sought, but may not always  have been located. Such creation errors do not reflect on  the standard of medical care.

## 2017-07-07 ENCOUNTER — Other Ambulatory Visit: Payer: Self-pay | Admitting: Rheumatology

## 2017-07-07 ENCOUNTER — Other Ambulatory Visit: Payer: Self-pay | Admitting: Physician Assistant

## 2017-07-07 MED ORDER — TRAMADOL HCL 50 MG PO TABS: 50.0000 mg | ORAL_TABLET | Freq: Four times a day (QID) | ORAL | 0 refills | Status: DC | PRN

## 2017-07-07 NOTE — Telephone Encounter (Signed)
Patient called requesting prescription refill of [facility] [address] [facility] [place].

## 2017-07-07 NOTE — Telephone Encounter (Signed)
Per Hazel Sams, PA-C okay to call prescription to the pharmacy

## 2017-07-07 NOTE — Telephone Encounter (Signed)
Last Visit:01/12/17 Next Visit: 07/20/17 UDS: 03/31/17 Narc Agreement: 01/22/17  Okay to refill Tramadol?

## 2017-07-10 NOTE — Telephone Encounter (Signed)
This prescription was refilled on 07/07/17.

## 2017-07-14 ENCOUNTER — Encounter: Payer: Self-pay | Admitting: Rheumatology

## 2017-07-14 ENCOUNTER — Ambulatory Visit (INDEPENDENT_AMBULATORY_CARE_PROVIDER_SITE_OTHER): Payer: No Typology Code available for payment source | Admitting: Rheumatology

## 2017-07-14 VITALS — BP 154/88 | HR 78 | Resp 15 | Ht 64.0 in | Wt 190.0 lb

## 2017-07-14 DIAGNOSIS — R768 Other specified abnormal immunological findings in serum: Secondary | ICD-10-CM

## 2017-07-14 DIAGNOSIS — M5136 Other intervertebral disc degeneration, lumbar region: Secondary | ICD-10-CM | POA: Diagnosis not present

## 2017-07-14 DIAGNOSIS — Z8709 Personal history of other diseases of the respiratory system: Secondary | ICD-10-CM | POA: Insufficient documentation

## 2017-07-14 DIAGNOSIS — M797 Fibromyalgia: Secondary | ICD-10-CM | POA: Diagnosis not present

## 2017-07-14 DIAGNOSIS — M249 Joint derangement, unspecified: Secondary | ICD-10-CM

## 2017-07-14 DIAGNOSIS — Z5181 Encounter for therapeutic drug level monitoring: Secondary | ICD-10-CM

## 2017-07-14 DIAGNOSIS — M51369 Other intervertebral disc degeneration, lumbar region without mention of lumbar back pain or lower extremity pain: Secondary | ICD-10-CM | POA: Insufficient documentation

## 2017-07-14 DIAGNOSIS — G4709 Other insomnia: Secondary | ICD-10-CM

## 2017-07-14 DIAGNOSIS — Z9049 Acquired absence of other specified parts of digestive tract: Secondary | ICD-10-CM

## 2017-07-14 DIAGNOSIS — Z8639 Personal history of other endocrine, nutritional and metabolic disease: Secondary | ICD-10-CM

## 2017-07-14 DIAGNOSIS — M810 Age-related osteoporosis without current pathological fracture: Secondary | ICD-10-CM

## 2017-07-14 DIAGNOSIS — M170 Bilateral primary osteoarthritis of knee: Secondary | ICD-10-CM | POA: Diagnosis not present

## 2017-07-14 DIAGNOSIS — Z8719 Personal history of other diseases of the digestive system: Secondary | ICD-10-CM

## 2017-07-16 LAB — PAIN MGMT, PROFILE 5 W/CONF, U
Amphetamines: NEGATIVE ng/mL (ref <500)
BENZODIAZEPINES: NEGATIVE ng/mL (ref <100)
Barbiturates: NEGATIVE ng/mL (ref <300)
COCAINE METABOLITE: NEGATIVE ng/mL (ref <150)
CREATININE: 35.3 mg/dL
Marijuana Metabolite: NEGATIVE ng/mL (ref <20)
Methadone Metabolite: NEGATIVE ng/mL (ref <100)
OPIATES: NEGATIVE ng/mL (ref <100)
OXYCODONE: NEGATIVE ng/mL (ref <100)
Oxidant: NEGATIVE ug/mL (ref <200)
PH: 6.06 (ref 4.5–9.0)

## 2017-07-16 LAB — PAIN MGMT, TRAMADOL W/MEDMATCH, U
DESMETHYLTRAMADOL: 1837 ng/mL — AB (ref <100)
Tramadol: 8522 ng/mL — ABNORMAL HIGH (ref <100)

## 2017-07-20 ENCOUNTER — Ambulatory Visit: Payer: Medicare Other | Admitting: Rheumatology

## 2017-08-02 ENCOUNTER — Telehealth: Payer: Self-pay | Admitting: Rheumatology

## 2017-08-02 NOTE — Telephone Encounter (Signed)
prtPatient called stating she is going to [place] on Friday 08/04/17 and won't be back until Monday 08/13/17.  Patient states her Tramadol refill date is Monday 08/07/17 and is asking if there is any way it could be called in early.  [facility] [address] [facility] [place].

## 2017-08-03 NOTE — Telephone Encounter (Signed)
I discussed this with Dr. Estanislado Pandy ,and she feels it is ok to provide an early refill this time since she is going out of town.

## 2017-08-03 NOTE — Telephone Encounter (Signed)
Last visit: 07/14/2017 Next visit: 01/19/2018 UDS: 07/14/2017 narc agreement: 07/14/2017  Last fill: 07/07/2017  Okay to refill tramadol?

## 2017-08-04 ENCOUNTER — Telehealth: Payer: Self-pay | Admitting: Rheumatology

## 2017-08-04 MED ORDER — TRAMADOL HCL 50 MG PO TABS: 50.0000 mg | ORAL_TABLET | Freq: Four times a day (QID) | ORAL | 0 refills | Status: DC | PRN

## 2017-08-04 NOTE — Telephone Encounter (Signed)
Patient called to check on the status of her refill of Tramadol.  Patient states that she is leaving in 30 minutes to head to [place].  [facility] [address] in [place].

## 2017-08-04 NOTE — Telephone Encounter (Signed)
Desiree Hogan has sent the prescription to the pharmacy. The patient has been notified.

## 2017-08-31 ENCOUNTER — Emergency Department (HOSPITAL_COMMUNITY)
Admission: EM | Admit: 2017-08-31 | Discharge: 2017-09-01 | Disposition: A | Discharge status: Home / Self Care | Payer: PRIVATE HEALTH INSURANCE | Attending: Emergency Medicine | Admitting: Emergency Medicine

## 2017-08-31 ENCOUNTER — Other Ambulatory Visit: Payer: Self-pay | Admitting: Rheumatology

## 2017-08-31 ENCOUNTER — Encounter (HOSPITAL_COMMUNITY): Payer: Self-pay | Admitting: *Deleted

## 2017-08-31 DIAGNOSIS — J449 Chronic obstructive pulmonary disease, unspecified: Secondary | ICD-10-CM | POA: Diagnosis not present

## 2017-08-31 DIAGNOSIS — Z79899 Other long term (current) drug therapy: Secondary | ICD-10-CM | POA: Insufficient documentation

## 2017-08-31 DIAGNOSIS — K5792 Diverticulitis of intestine, part unspecified, without perforation or abscess without bleeding: Secondary | ICD-10-CM | POA: Insufficient documentation

## 2017-08-31 DIAGNOSIS — K5732 Diverticulitis of large intestine without perforation or abscess without bleeding: Secondary | ICD-10-CM

## 2017-08-31 DIAGNOSIS — E119 Type 2 diabetes mellitus without complications: Secondary | ICD-10-CM | POA: Diagnosis not present

## 2017-08-31 DIAGNOSIS — R109 Unspecified abdominal pain: Secondary | ICD-10-CM | POA: Diagnosis present

## 2017-08-31 DIAGNOSIS — Z87891 Personal history of nicotine dependence: Secondary | ICD-10-CM | POA: Insufficient documentation

## 2017-08-31 NOTE — ED Triage Notes (Signed)
Pt with lower abd pain for past 2 days, pt denies N/V/D.  Pt denies any burning on urination.

## 2017-08-31 NOTE — Telephone Encounter (Signed)
Last visit: 07/14/2017 Next visit: 01/19/2018  Okay to refill per Dr. Estanislado Pandy

## 2017-09-01 ENCOUNTER — Emergency Department (HOSPITAL_COMMUNITY): Payer: PRIVATE HEALTH INSURANCE

## 2017-09-01 DIAGNOSIS — K5792 Diverticulitis of intestine, part unspecified, without perforation or abscess without bleeding: Secondary | ICD-10-CM | POA: Diagnosis not present

## 2017-09-01 LAB — CBC
HCT: 38.4 % (ref 36.0–46.0)
Hemoglobin: 12.5 g/dL (ref 12.0–15.0)
MCH: 30.4 pg (ref 26.0–34.0)
MCHC: 32.6 g/dL (ref 30.0–36.0)
MCV: 93.4 fL (ref 78.0–100.0)
Platelets: 211 10*3/uL (ref 150–400)
RBC: 4.11 MIL/uL (ref 3.87–5.11)
RDW: 12.3 % (ref 11.5–15.5)
WBC: 6.9 10*3/uL (ref 4.0–10.5)

## 2017-09-01 LAB — COMPREHENSIVE METABOLIC PANEL
ALT: 22 U/L (ref 0–44)
AST: 28 U/L (ref 15–41)
Albumin: 4.1 g/dL (ref 3.5–5.0)
Alkaline Phosphatase: 64 U/L (ref 38–126)
Anion gap: 8 (ref 5–15)
BILIRUBIN TOTAL: 0.6 mg/dL (ref 0.3–1.2)
BUN: 14 mg/dL (ref 8–23)
CALCIUM: 9.4 mg/dL (ref 8.9–10.3)
CHLORIDE: 107 mmol/L (ref 98–111)
CO2: 26 mmol/L (ref 22–32)
Creatinine, Ser: 0.79 mg/dL (ref 0.44–1.00)
Glucose, Bld: 120 mg/dL — ABNORMAL HIGH (ref 70–99)
Potassium: 3.9 mmol/L (ref 3.5–5.1)
Sodium: 141 mmol/L (ref 135–145)
TOTAL PROTEIN: 7.5 g/dL (ref 6.5–8.1)

## 2017-09-01 LAB — LIPASE, BLOOD: Lipase: 23 U/L (ref 11–51)

## 2017-09-01 LAB — URINALYSIS, ROUTINE W REFLEX MICROSCOPIC
BILIRUBIN URINE: NEGATIVE
Glucose, UA: NEGATIVE mg/dL
HGB URINE DIPSTICK: NEGATIVE
KETONES UR: NEGATIVE mg/dL
NITRITE: NEGATIVE
PH: 5 (ref 5.0–8.0)
PROTEIN: NEGATIVE mg/dL
Specific Gravity, Urine: 1.008 (ref 1.005–1.030)

## 2017-09-01 MED ORDER — METRONIDAZOLE 500 MG PO TABS: 500.0000 mg | ORAL_TABLET | Freq: Three times a day (TID) | ORAL | 0 refills | Status: DC

## 2017-09-01 MED ORDER — ONDANSETRON HCL 4 MG/2ML IJ SOLN
4.0000 mg | Freq: Once | INTRAMUSCULAR | Status: AC
  Administered 2017-09-01: 4 mg via INTRAVENOUS

## 2017-09-01 MED ORDER — MORPHINE SULFATE (PF) 4 MG/ML IV SOLN
4.0000 mg | Freq: Once | INTRAVENOUS | Status: AC
  Administered 2017-09-01: 4 mg via INTRAVENOUS

## 2017-09-01 MED ORDER — METRONIDAZOLE 500 MG PO TABS
500.0000 mg | ORAL_TABLET | Freq: Once | ORAL | Status: AC
  Administered 2017-09-01: 500 mg via ORAL
  Filled 2017-09-01: qty 1

## 2017-09-01 MED ORDER — ONDANSETRON HCL 4 MG/2ML IJ SOLN
INTRAMUSCULAR | Status: AC
  Filled 2017-09-01: qty 2

## 2017-09-01 MED ORDER — CIPROFLOXACIN HCL 500 MG PO TABS: 500.0000 mg | ORAL_TABLET | Freq: Two times a day (BID) | ORAL | 0 refills | Status: DC

## 2017-09-01 MED ORDER — CIPROFLOXACIN IN D5W 400 MG/200ML IV SOLN
400.0000 mg | Freq: Once | INTRAVENOUS | Status: AC
  Administered 2017-09-01: 400 mg via INTRAVENOUS
  Filled 2017-09-01: qty 200

## 2017-09-01 MED ORDER — IOPAMIDOL (ISOVUE-300) INJECTION 61%
100.0000 mL | Freq: Once | INTRAVENOUS | Status: AC | PRN
  Administered 2017-09-01: 100 mL via INTRAVENOUS

## 2017-09-01 MED ORDER — SODIUM CHLORIDE 0.9 % IV BOLUS
500.0000 mL | Freq: Once | INTRAVENOUS | Status: AC
  Administered 2017-09-01: 500 mL via INTRAVENOUS

## 2017-09-01 MED ORDER — MORPHINE SULFATE (PF) 4 MG/ML IV SOLN
INTRAVENOUS | Status: AC
  Filled 2017-09-01: qty 1

## 2017-09-01 NOTE — ED Provider Notes (Signed)
[facility] EMERGENCY DEPARTMENT Provider Note   CSN: [id number] Arrival date & time: 08/31/17  2304     History   Chief Complaint Chief Complaint  Patient presents with  . Abdominal Pain    HPI Desiree Hogan is a 62 y.o. female.  Patient presents to the emergency department for evaluation of abdominal pain.  Patient has been having pain for 2 days.  Pain is located in the left lower abdomen.  She reports that it has persistently worsened.  No associated fever, nausea, vomiting, diarrhea.  She denies urinary symptoms.  She reports that she has had this before, was told she might have Crohn's disease after a colonoscopy at [facility] several years ago.     Past Medical History:  Diagnosis Date  . Abdominal pain   . Anemia   . Back pain   . Colitis   . COPD (chronic obstructive pulmonary disease) (HCC)    not on home o2  . Crohn's disease ([place]) 03/29/12  . Diabetes mellitus without complication ([place])   . Fibromyalgia   . H/O breast biopsy   . Hip pain   . Hyperlipidemia   . Irritable bowel syndrome   . Joint pain   . Nonspecific abnormal finding in stool contents   . Peripheral vascular disease ([place])   . Ulcer    Mouth    Patient Active Problem List   Diagnosis Date Noted  . Fibromyalgia 07/14/2017  . ANA positive 07/14/2017  . Primary osteoarthritis of both knees 07/14/2017  . DDD (degenerative disc disease), lumbar 07/14/2017  . Hypermobility of joint 07/14/2017  . Age-related osteoporosis without current pathological fracture 07/14/2017  . Other insomnia 07/14/2017  . History of Crohn's disease 07/14/2017  . History of IBS 07/14/2017  . History of hypercholesterolemia 07/14/2017  . History of COPD 07/14/2017  . History of cholecystectomy 07/14/2017  . Community acquired pneumonia 12/13/2016  . Pneumonia 12/13/2016  . Leukocytosis 12/13/2016  . Hypokalemia 12/13/2016  . Diabetes mellitus without complication ([place]) 09/25/3005    Past  Surgical History:  Procedure Laterality Date  . CESAREAN SECTION  06/24/82  . CHOLECYSTECTOMY  1993   Gall Bladder  . COLONOSCOPY  Jan. 31, 2014  . [place] SURGERY  2010 and 2011  . TONSILLECTOMY       OB History   None      Home Medications    Prior to Admission medications   Medication Sig Start Date End Date Taking? Authorizing Provider  alendronate (FOSAMAX) 70 MG tablet Take 70 mg by mouth every 7 (seven) days. Take with a full glass of water on an empty stomach.    [provider]  BREO ELLIPTA 100-25 MCG/INH AEPB Inhale 1 puff into the lungs daily.  09/28/15   [provider]  calcium gluconate 500 MG tablet Take 600 mg by mouth daily.     [provider]  cetirizine (ZYRTEC) 10 MG tablet Take 10 mg by mouth daily.    [provider]  DM-Doxylamine-Acetaminophen (NYQUIL COLD & FLU PO) Take 10-15 mLs by mouth daily as needed (for cold and flu).    [provider]  DULoxetine (CYMBALTA) 30 MG capsule TAKE 1 CAPSULE BY MOUTH EVERY DAY 06/14/17   Bo Merino, MD  DULoxetine (CYMBALTA) 60 MG capsule TAKE 1 CAPSULE BY MOUTH EVERY DAY Patient not taking: Reported on 07/14/2017 02/03/17   Bo Merino, MD  gabapentin (NEURONTIN) 300 MG capsule Take 300 mg by mouth 3 (three) times  daily.    [provider]  gemfibrozil (LOPID) 600 MG tablet Take 600 mg by mouth 2 (two) times daily before a meal.    [provider]  ipratropium (ATROVENT) 0.06 % nasal spray Place 1 spray into both nostrils daily as needed for rhinitis.  11/18/15   [provider]  levothyroxine (SYNTHROID, LEVOTHROID) 125 MCG tablet Take 125 mcg by mouth daily. 11/29/16   [provider]  PROAIR HFA 108 (90 Base) MCG/ACT inhaler Inhale 1-2 puffs into the lungs every 6 (six) hours as needed for wheezing or shortness of breath.  11/18/15   [provider]  tiZANidine (ZANAFLEX) 4 MG tablet TAKE 1 TABLET BY MOUTH EVERYDAY AT  BEDTIME 12/08/16   Bo Merino, MD  tiZANidine (ZANAFLEX) 4 MG tablet TAKE 1 TABLET BY MOUTH EVERYDAY AT BEDTIME 08/31/17   Bo Merino, MD  traMADol (ULTRAM) 50 MG tablet Take 1 tablet (50 mg total) by mouth every 6 (six) hours as needed. 08/04/17   Ofilia Neas, PA-C    Family History Family History  Problem Relation Age of Onset  . Cancer Mother        Colon or vaginal ?  . Deep vein thrombosis Mother   . Hypertension Father   . Diabetes Father   . Heart disease Father        Heart Disease before age 76  . Diabetes Sister   . Diabetes Brother   . Hypertension Brother     Social History Social History   Tobacco Use  . Smoking status: Former Smoker    Packs/day: 0.75    Years: 20.00    Pack years: 15.00    Types: Cigarettes    Last attempt to quit: 01/03/2009    Years since quitting: 8.6  . Smokeless tobacco: Never Used  Substance Use Topics  . Alcohol use: No  . Drug use: No     Allergies   Bactrim [sulfamethoxazole-trimethoprim]; Penicillins; and Tetracyclines & related   Review of Systems Review of Systems  Gastrointestinal: Positive for abdominal pain.  All other systems reviewed and are negative.    Physical Exam Updated Vital Signs BP 136/65   Pulse 89   Temp 98.5 F (36.9 C) (Oral)   Resp 16   Ht 5' 4"  (1.626 m)   Wt 82.1 kg   SpO2 94%   BMI 31.07 kg/m   Physical Exam  Constitutional: She is oriented to person, place, and time. She appears well-developed and well-nourished. No distress.  HENT:  Head: Normocephalic and atraumatic.  Right Ear: Hearing normal.  Left Ear: Hearing normal.  Nose: Nose normal.  Mouth/Throat: Oropharynx is clear and moist and mucous membranes are normal.  Eyes: Pupils are equal, round, and reactive to light. Conjunctivae and EOM are normal.  Neck: Normal range of motion. Neck supple.  Cardiovascular: Regular rhythm, S1 normal and S2 normal. Exam reveals no gallop and no friction rub.  No murmur  heard. Pulmonary/Chest: Effort normal and breath sounds normal. No respiratory distress. She exhibits no tenderness.  Abdominal: Soft. Normal appearance and bowel sounds are normal. There is no hepatosplenomegaly. There is tenderness in the left lower quadrant. There is no rebound, no guarding, no tenderness at McBurney's point and negative Murphy's sign. No hernia.  Musculoskeletal: Normal range of motion.  Neurological: She is alert and oriented to person, place, and time. She has normal strength. No cranial nerve deficit or sensory deficit. Coordination normal. GCS eye subscore is 4. GCS verbal subscore  is 5. GCS motor subscore is 6.  Skin: Skin is warm, dry and intact. No rash noted. No cyanosis.  Psychiatric: She has a normal mood and affect. Her speech is normal and behavior is normal. Thought content normal.  Nursing note and vitals reviewed.    ED Treatments / Results  Labs (all labs ordered are listed, but only abnormal results are displayed) Labs Reviewed  COMPREHENSIVE METABOLIC PANEL - Abnormal; Notable for the following components:      Result Value   Glucose, Bld 120 (*)    All other components within normal limits  URINALYSIS, ROUTINE W REFLEX MICROSCOPIC - Abnormal; Notable for the following components:   Color, Urine STRAW (*)    Leukocytes, UA SMALL (*)    Bacteria, UA RARE (*)    All other components within normal limits  LIPASE, BLOOD  CBC    EKG None  Radiology Ct Abdomen Pelvis W Contrast  Result Date: 09/01/2017 CLINICAL DATA:  62 year old female with abdominal pain. Concern for acute diverticulitis. EXAM: CT ABDOMEN AND PELVIS WITH CONTRAST TECHNIQUE: Multidetector CT imaging of the abdomen and pelvis was performed using the standard protocol following bolus administration of intravenous contrast. CONTRAST:  189m ISOVUE-300 IOPAMIDOL (ISOVUE-300) INJECTION 61% COMPARISON:  CT of the abdomen pelvis dated 03/23/2012 FINDINGS: Lower chest: Minimal bibasilar  atelectatic changes. The visualized lung bases are otherwise clear. No intra-abdominal free air or free fluid. Hepatobiliary: Probable mild fatty liver. No intrahepatic biliary ductal dilatation. Cholecystectomy. Pancreas: Unremarkable. No pancreatic ductal dilatation or surrounding inflammatory changes. Spleen: Normal in size without focal abnormality. Adrenals/Urinary Tract: Adrenal glands are unremarkable. Kidneys are normal, without renal calculi, focal lesion, or hydronephrosis. Bladder is unremarkable. Stomach/Bowel: There is sigmoid diverticulosis with muscular hypertrophy. There is mild inflammatory changes adjacent to a sigmoid diverticula (series 7, image 47) consistent with mild acute diverticulitis. No diverticular abscess or perforation. Additional scattered colonic diverticula. No bowel obstruction. Normal appendix. Vascular/Lymphatic: Moderate aortoiliac atherosclerotic disease. The abdominal aorta and IVC are otherwise grossly unremarkable. No portal venous gas. There is no adenopathy. Reproductive: The uterus is anteverted and grossly unremarkable. The ovaries appear unremarkable as well. No pelvic mass. Other: Not Musculoskeletal: Lower lumbar fixation hardware and degenerative changes of the spine. No acute osseous pathology. IMPRESSION: 1. Mild sigmoid diverticulitis. No diverticular abscess or perforation. 2. Mild fatty liver. 3.  Aortic Atherosclerosis (ICD10-I70.0). Electronically Signed   By: AAnner CreteM.D.   On: 09/01/2017 03:19    Procedures Procedures (including critical care time)  Medications Ordered in ED Medications  ciprofloxacin (CIPRO) IVPB 400 mg (has no administration in time range)  metroNIDAZOLE (FLAGYL) tablet 500 mg (has no administration in time range)  sodium chloride 0.9 % bolus 500 mL (0 mLs Intravenous Stopped 09/01/17 0141)  morphine 4 MG/ML injection 4 mg (4 mg Intravenous Given 09/01/17 0111)  ondansetron (ZOFRAN) injection 4 mg (4 mg Intravenous Given  09/01/17 0111)  iopamidol (ISOVUE-300) 61 % injection 100 mL (100 mLs Intravenous Contrast Given 09/01/17 0247)     Initial Impression / Assessment and Plan / ED Course  I have reviewed the triage vital signs and the nursing notes.  Pertinent labs & imaging results that were available during my care of the patient were reviewed by me and considered in my medical decision making (see chart for details).     Patient presents with several days of progressively worsening left lower quadrant pain.  Examination revealed tenderness with slight guarding in the left lower quadrant, no signs of  peritonitis.  Blood work was unremarkable.  CT scan performed does confirm mild uncomplicated sigmoid diverticulitis.  Patient will be appropriate for outpatient treatment with Cipro and Flagyl, analgesia.  Follow-up with GI.  Final Clinical Impressions(s) / ED Diagnoses   Final diagnoses:  Sigmoid diverticulitis    ED Discharge Orders    None       Orpah Greek, MD 09/01/17 [phone]

## 2017-09-06 ENCOUNTER — Telehealth: Payer: Self-pay | Admitting: Rheumatology

## 2017-09-06 NOTE — Telephone Encounter (Signed)
Advised patient a refill was sent to the pharmacy on 08/31/2017. Patient verbalized understanding and will contact the pharmacy.

## 2017-09-06 NOTE — Telephone Encounter (Signed)
Patient called requesting prescription refill of Tizanidine to [facility] [address] in [place].

## 2017-09-08 ENCOUNTER — Telehealth: Payer: Self-pay | Admitting: Rheumatology

## 2017-09-08 MED ORDER — TRAMADOL HCL 50 MG PO TABS: 50.0000 mg | ORAL_TABLET | Freq: Four times a day (QID) | ORAL | 0 refills | Status: DC | PRN

## 2017-09-08 NOTE — Telephone Encounter (Signed)
Patient called requesting prescription refill of Tramadol to [facility] [address] in [place].

## 2017-09-08 NOTE — Telephone Encounter (Signed)
Last visit: 07/14/2017 Next visit: 01/19/2018 UDS: 07/14/2017 narc agreement: 07/14/2017  Last fill: 08/04/17  Okay to refill tramadol?

## 2017-09-11 ENCOUNTER — Other Ambulatory Visit: Payer: Self-pay | Admitting: Rheumatology

## 2017-09-11 NOTE — Telephone Encounter (Signed)
Last visit: 07/14/2017 Next visit: 01/19/2018  Okay to refill per Dr. Estanislado Pandy

## 2017-09-20 ENCOUNTER — Other Ambulatory Visit: Payer: Self-pay

## 2017-09-20 ENCOUNTER — Emergency Department (HOSPITAL_COMMUNITY): Payer: 59

## 2017-09-20 ENCOUNTER — Emergency Department (HOSPITAL_COMMUNITY)
Admission: EM | Admit: 2017-09-20 | Discharge: 2017-09-20 | Disposition: A | Discharge status: Home / Self Care | Payer: 59 | Attending: Emergency Medicine | Admitting: Emergency Medicine

## 2017-09-20 ENCOUNTER — Encounter (HOSPITAL_COMMUNITY): Payer: Self-pay | Admitting: Emergency Medicine

## 2017-09-20 DIAGNOSIS — E785 Hyperlipidemia, unspecified: Secondary | ICD-10-CM | POA: Diagnosis not present

## 2017-09-20 DIAGNOSIS — E119 Type 2 diabetes mellitus without complications: Secondary | ICD-10-CM | POA: Insufficient documentation

## 2017-09-20 DIAGNOSIS — S6991XA Unspecified injury of right wrist, hand and finger(s), initial encounter: Secondary | ICD-10-CM | POA: Diagnosis not present

## 2017-09-20 DIAGNOSIS — J449 Chronic obstructive pulmonary disease, unspecified: Secondary | ICD-10-CM | POA: Diagnosis not present

## 2017-09-20 DIAGNOSIS — Z87891 Personal history of nicotine dependence: Secondary | ICD-10-CM | POA: Insufficient documentation

## 2017-09-20 DIAGNOSIS — M25531 Pain in right wrist: Secondary | ICD-10-CM | POA: Diagnosis present

## 2017-09-20 DIAGNOSIS — Z79899 Other long term (current) drug therapy: Secondary | ICD-10-CM | POA: Insufficient documentation

## 2017-09-20 DIAGNOSIS — M79631 Pain in right forearm: Secondary | ICD-10-CM | POA: Diagnosis not present

## 2017-09-20 DIAGNOSIS — S59911A Unspecified injury of right forearm, initial encounter: Secondary | ICD-10-CM | POA: Diagnosis not present

## 2017-09-20 MED ORDER — ACETAMINOPHEN 500 MG PO TABS
1000.0000 mg | ORAL_TABLET | Freq: Once | ORAL | Status: AC
  Administered 2017-09-20: 1000 mg via ORAL
  Filled 2017-09-20: qty 2

## 2017-09-20 NOTE — Discharge Instructions (Signed)
Today your x-rays did not show any broken or dislocated bones.  As we discussed you have pain over the scaphoid, a bone in your wrist, and therefore will need to wear the brace you were given today and follow-up with your primary care doctor in 10 to 14 days to arrange for repeat x-rays.  If you have any concerns you may follow-up with them sooner.  Please wear your wrist splint.  You may take it off only to shower.  Please do not use your right hand while you are in the shower.  Please take Tylenol (acetaminophen) to relieve your pain.  You may take tylenol, up to 1,000 mg (two extra strength pills).  Do not take more than 3,000 mg tylenol in a 24 hour period.  Please check all medication labels as many medications such as pain and cold medications may contain tylenol. Please do not drink alcohol while taking this medication.

## 2017-09-20 NOTE — ED Notes (Signed)
Pt returned from xray

## 2017-09-20 NOTE — ED Triage Notes (Addendum)
Pt fell at 1830 this afternoon & landed on right wrist. Pt c/o pain and finger tingling in affected hand

## 2017-09-20 NOTE — ED Provider Notes (Signed)
[facility] EMERGENCY DEPARTMENT Provider Note   CSN: [id number] Arrival date & time: 09/20/17  2136     History   Chief Complaint Chief Complaint  Patient presents with  . Wrist Pain    HPI SHEMEIKA Hogan is a 62 y.o. female with past medical history of fibromyalgia, COPD, DM, who presents today for evaluation of right wrist pain after a fall.  Desiree Hogan reports that Desiree Hogan was outside with her dogs walking backwards when Desiree Hogan tripped over a stump.  Desiree Hogan landed on her right hand.  Desiree Hogan does not take any blood thinners, denies striking her head or passing out.  Desiree Hogan denies any other injuries from this fall.  The pain is primarily in her right wrist.  Desiree Hogan reports tingling in the tips of all of her fingers.  No neck pain.  HPI  Past Medical History:  Diagnosis Date  . Abdominal pain   . Anemia   . Back pain   . Colitis   . COPD (chronic obstructive pulmonary disease) (HCC)    not on home o2  . Crohn's disease ([place]) 03/29/12  . Diabetes mellitus without complication ([place])   . Fibromyalgia   . H/O breast biopsy   . Hip pain   . Hyperlipidemia   . Irritable bowel syndrome   . Joint pain   . Nonspecific abnormal finding in stool contents   . Peripheral vascular disease ([place])   . Ulcer    Mouth    Patient Active Problem List   Diagnosis Date Noted  . Fibromyalgia 07/14/2017  . ANA positive 07/14/2017  . Primary osteoarthritis of both knees 07/14/2017  . DDD (degenerative disc disease), lumbar 07/14/2017  . Hypermobility of joint 07/14/2017  . Age-related osteoporosis without current pathological fracture 07/14/2017  . Other insomnia 07/14/2017  . History of Crohn's disease 07/14/2017  . History of IBS 07/14/2017  . History of hypercholesterolemia 07/14/2017  . History of COPD 07/14/2017  . History of cholecystectomy 07/14/2017  . Community acquired pneumonia 12/13/2016  . Pneumonia 12/13/2016  . Leukocytosis 12/13/2016  . Hypokalemia 12/13/2016  . Diabetes mellitus without  complication ([place]) 40/98/1191    Past Surgical History:  Procedure Laterality Date  . CESAREAN SECTION  06/24/82  . CHOLECYSTECTOMY  1993   Gall Bladder  . COLONOSCOPY  Jan. 31, 2014  . [place] SURGERY  2010 and 2011  . TONSILLECTOMY       OB History   None      Home Medications    Prior to Admission medications   Medication Sig Start Date End Date Taking? Authorizing Provider  alendronate (FOSAMAX) 70 MG tablet Take 70 mg by mouth every Friday. Take with a full glass of water on an empty stomach.    Yes [provider]  BREO ELLIPTA 100-25 MCG/INH AEPB Inhale 1 puff into the lungs daily.  09/28/15  Yes [provider]  calcium gluconate 500 MG tablet Take 600 mg by mouth daily.    Yes [provider]  cetirizine (ZYRTEC) 10 MG tablet Take 10 mg by mouth daily.   Yes [provider]  cholecalciferol (VITAMIN D) 1000 units tablet Take 1,000 Units by mouth daily.   Yes [provider]  DM-Doxylamine-Acetaminophen (NYQUIL COLD & FLU PO) Take 10-15 mLs by mouth daily as needed (for cold and flu).   Yes [provider]  DULoxetine (CYMBALTA) 30 MG capsule TAKE 1 CAPSULE BY MOUTH EVERY DAY Patient taking differently: Take 30 mg by mouth daily.  09/11/17  Yes Deveshwar, Abel Presto, MD  gabapentin (NEURONTIN) 300 MG capsule Take 300 mg by mouth 3 (three) times daily.   Yes [provider]  gemfibrozil (LOPID) 600 MG tablet Take 600 mg by mouth 2 (two) times daily before a meal.   Yes [provider]  levothyroxine (SYNTHROID, LEVOTHROID) 125 MCG tablet Take 125 mcg by mouth daily. 11/29/16  Yes [provider]  PROAIR HFA 108 (90 Base) MCG/ACT inhaler Inhale 1-2 puffs into the lungs every 6 (six) hours as needed for wheezing or shortness of breath.  11/18/15  Yes [provider]  tiZANidine (ZANAFLEX) 4 MG tablet TAKE 1 TABLET BY MOUTH EVERYDAY AT BEDTIME Patient taking differently: Take 4 mg by mouth at  bedtime.  12/08/16  Yes Deveshwar, Abel Presto, MD  traMADol (ULTRAM) 50 MG tablet Take 1 tablet (50 mg total) by mouth every 6 (six) hours as needed. 09/08/17  Yes Ofilia Neas, PA-C  ciprofloxacin (CIPRO) 500 MG tablet Take 1 tablet (500 mg total) by mouth 2 (two) times daily. Patient not taking: Reported on 09/20/2017 09/01/17   Orpah Greek, MD  DULoxetine (CYMBALTA) 60 MG capsule TAKE 1 CAPSULE BY MOUTH EVERY DAY Patient not taking: Reported on 07/14/2017 02/03/17   Bo Merino, MD  ipratropium (ATROVENT) 0.06 % nasal spray Place 1 spray into both nostrils daily as needed for rhinitis.  11/18/15   [provider]  metroNIDAZOLE (FLAGYL) 500 MG tablet Take 1 tablet (500 mg total) by mouth 3 (three) times daily. Patient not taking: Reported on 09/20/2017 09/01/17   Orpah Greek, MD  tiZANidine (ZANAFLEX) 4 MG tablet TAKE 1 TABLET BY MOUTH EVERYDAY AT BEDTIME Patient not taking: Reported on 09/20/2017 08/31/17   Bo Merino, MD    Family History Family History  Problem Relation Age of Onset  . Cancer Mother        Colon or vaginal ?  . Deep vein thrombosis Mother   . Hypertension Father   . Diabetes Father   . Heart disease Father        Heart Disease before age 54  . Diabetes Sister   . Diabetes Brother   . Hypertension Brother     Social History Social History   Tobacco Use  . Smoking status: Former Smoker    Packs/day: 0.75    Years: 20.00    Pack years: 15.00    Types: Cigarettes    Last attempt to quit: 01/03/2009    Years since quitting: 8.7  . Smokeless tobacco: Never Used  Substance Use Topics  . Alcohol use: No  . Drug use: No     Allergies   Bactrim [sulfamethoxazole-trimethoprim]; Penicillins; and Tetracyclines & related   Review of Systems Review of Systems  Constitutional: Negative for chills and fever.  Musculoskeletal:       Pain in right wrist  Skin: Negative for color change and wound.  Neurological: Negative for  weakness, numbness and headaches.       Tingling in tips of fingers of right hand  All other systems reviewed and are negative.    Physical Exam Updated Vital Signs BP (!) 153/86   Pulse 91   Temp 98.4 F (36.9 C) (Oral)   Resp 16   SpO2 96%   Physical Exam  Constitutional: Desiree Hogan appears well-developed and well-nourished.  Cardiovascular: Normal rate and intact distal pulses.  Brisk capillary refill to fingers on right hand.  2+ right radial pulse.  Pulmonary/Chest: Effort normal. No respiratory  distress.  Musculoskeletal:  Desiree Hogan has full active range of motion of the fingers on her right hand.  Right wrist range of motion is limited secondary to pain.  Desiree Hogan has no tenderness to palpation along the right elbow, right upper arm, right shoulder, or neck.  Neurological: Desiree Hogan is alert.  Subjective decreased sensation distal to the DIP joint of the entire long and ring finger.  Skin: Skin is warm and dry. Desiree Hogan is not diaphoretic.  Superficial abrasion over right posterior elbow.  No wounds over right wrist.  Psychiatric: Desiree Hogan has a normal mood and affect. Her behavior is normal.  Nursing note and vitals reviewed.    ED Treatments / Results  Labs (all labs ordered are listed, but only abnormal results are displayed) Labs Reviewed - No data to display  EKG None  Radiology Dg Forearm Right  Result Date: 09/20/2017 CLINICAL DATA:  Generalized right wrist and posterior forearm pain with abrasions. Trip and fall injury. Tingling in the right hand. EXAM: RIGHT WRIST - COMPLETE 3+ VIEW; RIGHT FOREARM - 2 VIEW COMPARISON:  None. FINDINGS: Four views of the right wrist and two views of the right forearm are obtained. There are degenerative changes in the STT, first carpometacarpal, and radiocarpal joints. No evidence of acute fracture or dislocation of the right wrist, radius, or ulna. No focal bone lesion or bone destruction. Bone cortex appears intact. Soft tissues are unremarkable. IMPRESSION:  No acute bony abnormalities demonstrated in the right wrist or right forearm. Degenerative changes in the STT, first carpometacarpal, and radiocarpal joints. Electronically Signed   By: Lucienne Capers M.D.   On: 09/20/2017 23:03   Dg Wrist Complete Right  Result Date: 09/20/2017 CLINICAL DATA:  Generalized right wrist and posterior forearm pain with abrasions. Trip and fall injury. Tingling in the right hand. EXAM: RIGHT WRIST - COMPLETE 3+ VIEW; RIGHT FOREARM - 2 VIEW COMPARISON:  None. FINDINGS: Four views of the right wrist and two views of the right forearm are obtained. There are degenerative changes in the STT, first carpometacarpal, and radiocarpal joints. No evidence of acute fracture or dislocation of the right wrist, radius, or ulna. No focal bone lesion or bone destruction. Bone cortex appears intact. Soft tissues are unremarkable. IMPRESSION: No acute bony abnormalities demonstrated in the right wrist or right forearm. Degenerative changes in the STT, first carpometacarpal, and radiocarpal joints. Electronically Signed   By: Lucienne Capers M.D.   On: 09/20/2017 23:03    Procedures Procedures (including critical care time)  Medications Ordered in ED Medications  acetaminophen (TYLENOL) tablet 1,000 mg (1,000 mg Oral Given 09/20/17 2340)     Initial Impression / Assessment and Plan / ED Course  I have reviewed the triage vital signs and the nursing notes.  Pertinent labs & imaging results that were available during my care of the patient were reviewed by me and considered in my medical decision making (see chart for details).    Patient presents today for evaluation of right wrist pain after a mechanical fall that occurred around 630 tonight.  Desiree Hogan has not taken anything for pain prior to arrival.  X-rays were obtained showing degenerative changes in the radiocarpal joints, and multiple places between the carpals themselves without evidence of acute abnormalities or soft tissue  swelling.  Desiree Hogan has tenderness over the scaphoid, therefore is given a thumb spica splint with instructions to follow-up in 10 to 14 days with her PCP for repeat x-rays.  Discussed the implications of this.  Desiree Hogan  denies any other injuries other than pain in her right wrist.  Her tingling in her right distal long and ring finger does not match a specific peripheral nerve distribution, therefore suspect that it may be secondary to swelling.  Return precautions were discussed with patient who states their understanding.  At the time of discharge patient denied any unaddressed complaints or concerns.  Patient is agreeable for discharge home.   Final Clinical Impressions(s) / ED Diagnoses   Final diagnoses:  Acute pain of right wrist    ED Discharge Orders    None       Ollen Gross 09/20/17 2344    Ripley Fraise, MD 09/21/17 [phone]

## 2017-09-21 ENCOUNTER — Ambulatory Visit (INDEPENDENT_AMBULATORY_CARE_PROVIDER_SITE_OTHER): Payer: 59 | Admitting: Internal Medicine

## 2017-10-09 ENCOUNTER — Telehealth: Payer: Self-pay | Admitting: Rheumatology

## 2017-10-09 MED ORDER — TRAMADOL HCL 50 MG PO TABS: 50.0000 mg | ORAL_TABLET | Freq: Four times a day (QID) | ORAL | 0 refills | Status: DC | PRN

## 2017-10-09 NOTE — Telephone Encounter (Signed)
Last visit: 07/14/2017 Next visit: 01/19/2018 UDS: 07/14/17 Narc Agreement: 07/14/17  Okay to refill Tramadol?

## 2017-10-09 NOTE — Telephone Encounter (Signed)
[facility] [place]. Please call with questions.

## 2017-10-11 ENCOUNTER — Encounter (INDEPENDENT_AMBULATORY_CARE_PROVIDER_SITE_OTHER): Payer: Self-pay | Admitting: Internal Medicine

## 2017-10-11 ENCOUNTER — Ambulatory Visit (INDEPENDENT_AMBULATORY_CARE_PROVIDER_SITE_OTHER): Payer: 59 | Admitting: Internal Medicine

## 2017-10-11 ENCOUNTER — Telehealth (INDEPENDENT_AMBULATORY_CARE_PROVIDER_SITE_OTHER): Payer: Self-pay | Admitting: Internal Medicine

## 2017-10-11 VITALS — BP 150/76 | HR 76 | Temp 98.2°F | Ht 64.0 in | Wt 190.3 lb

## 2017-10-11 DIAGNOSIS — K5732 Diverticulitis of large intestine without perforation or abscess without bleeding: Secondary | ICD-10-CM | POA: Diagnosis not present

## 2017-10-11 DIAGNOSIS — K5010 Crohn's disease of large intestine without complications: Secondary | ICD-10-CM

## 2017-10-11 LAB — CBC WITH DIFFERENTIAL/PLATELET
Basophils Absolute: 60 cells/uL (ref 0–200)
Basophils Relative: 0.9 %
Eosinophils Absolute: 208 cells/uL (ref 15–500)
Eosinophils Relative: 3.1 %
HEMATOCRIT: 35.9 % (ref 35.0–45.0)
HEMOGLOBIN: 12.2 g/dL (ref 11.7–15.5)
LYMPHS ABS: 2379 {cells}/uL (ref 850–3900)
MCH: 29.5 pg (ref 27.0–33.0)
MCHC: 34 g/dL (ref 32.0–36.0)
MCV: 86.9 fL (ref 80.0–100.0)
MPV: 12.4 fL (ref 7.5–12.5)
Monocytes Relative: 10 %
NEUTROS ABS: 3384 {cells}/uL (ref 1500–7800)
Neutrophils Relative %: 50.5 %
Platelets: 234 10*3/uL (ref 140–400)
RBC: 4.13 10*6/uL (ref 3.80–5.10)
RDW: 12 % (ref 11.0–15.0)
Total Lymphocyte: 35.5 %
WBC: 6.7 10*3/uL (ref 3.8–10.8)
WBCMIX: 670 {cells}/uL (ref 200–950)

## 2017-10-11 LAB — SEDIMENTATION RATE: Sed Rate: 11 mm/h (ref 0–30)

## 2017-10-11 NOTE — Telephone Encounter (Signed)
TCS sch'd 12/20/17, patient aware

## 2017-10-11 NOTE — Patient Instructions (Signed)
Labs today

## 2017-10-11 NOTE — Telephone Encounter (Signed)
Ann, Colonoscopy

## 2017-10-11 NOTE — Progress Notes (Signed)
Subjective:    Patient ID: Desiree Hogan, female    DOB: 04-18-1955, 62 y.o.   MRN: [id number]  HPI Referred by the ED for sigmoid diverticulitis. Seen in the ED 8/292019 for abdominal pain LLQ. Pain had worsened.  Pain x 2-3 days before going to the ED. CT scan revealed IMPRESSION: 1. Mild sigmoid diverticulitis. No diverticular abscess or perforation. 2. Mild fatty liver. 3.  Aortic Atherosclerosis (ICD10-I70.0). She was covered with Cipro and Flagyl.  She tells me she is better. She says she has a small amt of pain which comes and goes.  Pain is nothing like it was. Her BMs are moving normally.  She states she had a bout of diverticulitis 4-5 yrs ago.  Her appetite is is okay.  No family hx of Crohn's disease or UC.  Her last colonoscopy was in 2014 (colitis) Dr Britta Mccreedy: Two polyps in the proximal colon. Inflammation. Stricture in the ascending colon. Mild diverticulosis in the left colon.  Recommendations: Await path results. Continue the Prednisone. asacol 869m TID (Has not been on Asacol since Dr. BBritta Mccreedyleft).  Biopsy: Colon, Terminal ileum: no abnormality Colon Ileocecal valve, : No significant abnormality. Colon ascending: No significant abnormality Colon Hepatic: Acute colitis with granulation tissue formation.  Colon Transverse: Acute colitis with granulation tissue formation.   Review of Systems Past Medical History:  Diagnosis Date  . Abdominal pain   . Anemia   . Back pain   . Colitis   . COPD (chronic obstructive pulmonary disease) (HCC)    not on home o2  . [place]'s disease ([place] 03[place]  . Diabetes mellitus without complication ([place]   . Fibromyalgia   . H/O breast biopsy   . Hip pain   . Hyperlipidemia   . Irritable bowel syndrome   . Joint pain   . Nonspecific abnormal finding in stool contents   . Peripheral vascular disease ([place]   . Ulcer    Mouth    Past Surgical History:  Procedure Laterality Date  . CESAREAN SECTION  06/24/82  .  CHOLECYSTECTOMY  1993   Gall Bladder  . COLONOSCOPY  Jan. 31, 2014  [place]  . TONSILLECTOM[place]      Allergies  Allergen Reactions  . Bactrim [Sulfamethoxazole-Trimethoprim] Itching    And sores  . Penicillins Anaphylaxis    Has patient had a PCN reaction causing immediate rash, facial/tongue/throat swelling, SOB or lightheadedness with hypotension: Yes Has patient had a PCN reaction causing severe rash involving mucus membranes or skin necrosis: Yes Has patient had a PCN reaction that required hospitalization: No Has patient had a PCN reaction occurring within the last 10 years: No If all of the above answers are "NO", then may proceed with Cephalosporin use.   . Tetracyclines & Related Swelling and Rash    Current Outpatient Medications on File Prior to Visit  Medication Sig Dispense Refill  . alendronate (FOSAMAX) 70 MG tablet Take 70 mg by mouth every Friday. Take with a full glass of water on an empty stomach.     .Marland KitchenBREO ELLIPTA 100-25 MCG/INH AEPB Inhale 1 puff into the lungs daily.     . calcium gluconate 500 MG tablet Take 600 mg by mouth daily.     . cetirizine (ZYRTEC) 10 MG tablet Take 10 mg by mouth daily.    . cholecalciferol (VITAMIN D) 1000 units tablet Take 1,000 Units by mouth daily.    . DULoxetine (CYMBALTA) 60 MG capsule  TAKE 1 CAPSULE BY MOUTH EVERY DAY (Patient taking differently: 30 mg. ) 90 capsule 1  . gabapentin (NEURONTIN) 300 MG capsule Take 300 mg by mouth 3 (three) times daily.    Marland Kitchen gemfibrozil (LOPID) 600 MG tablet Take 600 mg by mouth 2 (two) times daily before a meal.    . ipratropium (ATROVENT) 0.06 % nasal spray Place 1 spray into both nostrils daily as needed for rhinitis.     Marland Kitchen levothyroxine (SYNTHROID, LEVOTHROID) 125 MCG tablet Take 125 mcg by mouth daily.  3  . PROAIR HFA 108 (90 Base) MCG/ACT inhaler Inhale 1-2 puffs into the lungs every 6 (six) hours as needed for wheezing or shortness of breath.     Marland Kitchen tiZANidine  (ZANAFLEX) 4 MG tablet TAKE 1 TABLET BY MOUTH EVERYDAY AT BEDTIME (Patient taking differently: Take 4 mg by mouth at bedtime. ) 90 tablet 0  . traMADol (ULTRAM) 50 MG tablet Take 1 tablet (50 mg total) by mouth every 6 (six) hours as needed. 90 tablet 0   No current facility-administered medications on file prior to visit.         Objective:   Physical Exam Blood pressure (!) 150/76, pulse 76, temperature 98.2 F (36.8 C), height 5' 4"  (1.626 m), weight 190 lb 4.8 oz (86.3 kg).  Alert and oriented. Skin warm and dry. Oral mucosa is moist.   . Sclera anicteric, conjunctivae is pink. Thyroid not enlarged. No cervical lymphadenopathy. Lungs clear. Heart regular rate and rhythm.  Abdomen is soft. Bowel sounds are positive. No hepatomegaly. No abdominal masses felt. No tenderness.  No edema to lower extremities.           Assessment & Plan:  Sigmoid diverticulitis. Will get a CBC and sedrate today.   Colonoscopy to rule out Crohn's disease.

## 2017-11-07 ENCOUNTER — Telehealth (INDEPENDENT_AMBULATORY_CARE_PROVIDER_SITE_OTHER): Payer: Self-pay | Admitting: *Deleted

## 2017-11-07 ENCOUNTER — Encounter (INDEPENDENT_AMBULATORY_CARE_PROVIDER_SITE_OTHER): Payer: Self-pay | Admitting: *Deleted

## 2017-11-07 MED ORDER — SUPREP BOWEL PREP KIT 17.5-3.13-1.6 GM/177ML PO SOLN: 1.0000 | Freq: Once | ORAL | 0 refills | Status: AC

## 2017-11-07 NOTE — Telephone Encounter (Signed)
Patient needs suprep 

## 2017-11-10 ENCOUNTER — Telehealth: Payer: Self-pay | Admitting: Rheumatology

## 2017-11-10 MED ORDER — TRAMADOL HCL 50 MG PO TABS: 50.0000 mg | ORAL_TABLET | Freq: Four times a day (QID) | ORAL | 0 refills | Status: DC | PRN

## 2017-11-10 NOTE — Telephone Encounter (Signed)
Last visit: 07/14/2017 Next visit: 01/19/2018 UDS: 07/14/2017 narc agreement: 07/14/2017  Last fill: 10/09/17  Okay to refill tramadol?

## 2017-11-10 NOTE — Telephone Encounter (Signed)
Patient called requesting prescription refill of Tramadol to be [facility] to [facility] [address] in [place].  Patient states she is out of medication and requesting to pick up the prescription today.

## 2017-11-28 ENCOUNTER — Other Ambulatory Visit: Payer: Self-pay | Admitting: Rheumatology

## 2017-11-28 NOTE — Telephone Encounter (Signed)
Last visit: 07/14/2017 Next visit: 01/19/2018  Okay to refill per Dr. Estanislado Pandy

## 2017-12-04 ENCOUNTER — Other Ambulatory Visit: Payer: Self-pay | Admitting: Rheumatology

## 2017-12-04 NOTE — Telephone Encounter (Signed)
Last visit: 07/14/2017 Next visit: 01/19/2018  Okay to refill per Dr. Estanislado Pandy

## 2017-12-05 ENCOUNTER — Telehealth: Payer: Self-pay | Admitting: Rheumatology

## 2017-12-05 NOTE — Telephone Encounter (Signed)
Patient advised prescription has been sent to the pharmacy on 11/28/17.

## 2017-12-05 NOTE — Telephone Encounter (Signed)
Patient request a refill on Tizanidine 30m [place] [facility] [place]

## 2017-12-11 ENCOUNTER — Telehealth (INDEPENDENT_AMBULATORY_CARE_PROVIDER_SITE_OTHER): Payer: Self-pay | Admitting: Orthopaedic Surgery

## 2017-12-11 MED ORDER — TRAMADOL HCL 50 MG PO TABS: 50.0000 mg | ORAL_TABLET | Freq: Four times a day (QID) | ORAL | 0 refills | Status: DC | PRN

## 2017-12-11 NOTE — Telephone Encounter (Signed)
ok 

## 2017-12-11 NOTE — Telephone Encounter (Signed)
Please advise 

## 2017-12-11 NOTE — Telephone Encounter (Signed)
Last visit: 07/14/2017 Next visit: 01/19/2018 UDS: 07/14/2017 narc agreement: 07/14/2017  Last fill:11/10/17  Okay to refill tramadol?

## 2017-12-11 NOTE — Telephone Encounter (Signed)
Dr. Estanislado Pandy patient, not Dr. Durward Fortes patient. Thank you.

## 2017-12-11 NOTE — Telephone Encounter (Signed)
Patient needs refill on Tramadol sent [facility] [place].

## 2017-12-20 ENCOUNTER — Other Ambulatory Visit: Payer: Self-pay

## 2017-12-20 ENCOUNTER — Encounter (HOSPITAL_COMMUNITY): Payer: Self-pay | Admitting: *Deleted

## 2017-12-20 ENCOUNTER — Encounter (HOSPITAL_COMMUNITY)
Admission: RE | Disposition: A | Discharge status: Home / Self Care | Payer: Self-pay | Source: Home / Self Care | Attending: Internal Medicine

## 2017-12-20 ENCOUNTER — Ambulatory Visit (HOSPITAL_COMMUNITY)
Admission: RE | Admit: 2017-12-20 | Discharge: 2017-12-20 | Disposition: A | Discharge status: Home / Self Care | Payer: 59 | Attending: Internal Medicine | Admitting: Internal Medicine

## 2017-12-20 DIAGNOSIS — Z79890 Hormone replacement therapy (postmenopausal): Secondary | ICD-10-CM | POA: Diagnosis not present

## 2017-12-20 DIAGNOSIS — E785 Hyperlipidemia, unspecified: Secondary | ICD-10-CM | POA: Insufficient documentation

## 2017-12-20 DIAGNOSIS — J449 Chronic obstructive pulmonary disease, unspecified: Secondary | ICD-10-CM | POA: Diagnosis not present

## 2017-12-20 DIAGNOSIS — K648 Other hemorrhoids: Secondary | ICD-10-CM

## 2017-12-20 DIAGNOSIS — Z8719 Personal history of other diseases of the digestive system: Secondary | ICD-10-CM

## 2017-12-20 DIAGNOSIS — Z09 Encounter for follow-up examination after completed treatment for conditions other than malignant neoplasm: Secondary | ICD-10-CM | POA: Diagnosis not present

## 2017-12-20 DIAGNOSIS — E1151 Type 2 diabetes mellitus with diabetic peripheral angiopathy without gangrene: Secondary | ICD-10-CM | POA: Diagnosis not present

## 2017-12-20 DIAGNOSIS — K633 Ulcer of intestine: Secondary | ICD-10-CM

## 2017-12-20 DIAGNOSIS — Z79899 Other long term (current) drug therapy: Secondary | ICD-10-CM | POA: Insufficient documentation

## 2017-12-20 DIAGNOSIS — K5730 Diverticulosis of large intestine without perforation or abscess without bleeding: Secondary | ICD-10-CM | POA: Diagnosis not present

## 2017-12-20 DIAGNOSIS — K6389 Other specified diseases of intestine: Secondary | ICD-10-CM

## 2017-12-20 DIAGNOSIS — Z87891 Personal history of nicotine dependence: Secondary | ICD-10-CM | POA: Insufficient documentation

## 2017-12-20 DIAGNOSIS — K5190 Ulcerative colitis, unspecified, without complications: Secondary | ICD-10-CM | POA: Diagnosis not present

## 2017-12-20 DIAGNOSIS — Z7983 Long term (current) use of bisphosphonates: Secondary | ICD-10-CM | POA: Insufficient documentation

## 2017-12-20 DIAGNOSIS — K5010 Crohn's disease of large intestine without complications: Secondary | ICD-10-CM | POA: Insufficient documentation

## 2017-12-20 DIAGNOSIS — K5732 Diverticulitis of large intestine without perforation or abscess without bleeding: Secondary | ICD-10-CM | POA: Insufficient documentation

## 2017-12-20 DIAGNOSIS — Z1211 Encounter for screening for malignant neoplasm of colon: Secondary | ICD-10-CM

## 2017-12-20 DIAGNOSIS — E039 Hypothyroidism, unspecified: Secondary | ICD-10-CM | POA: Insufficient documentation

## 2017-12-20 HISTORY — PX: BIOPSY: SHX5522

## 2017-12-20 HISTORY — PX: COLONOSCOPY: SHX5424

## 2017-12-20 HISTORY — DX: Hypothyroidism, unspecified: E03.9

## 2017-12-20 LAB — GLUCOSE, CAPILLARY: Glucose-Capillary: 116 mg/dL — ABNORMAL HIGH (ref 70–99)

## 2017-12-20 SURGERY — COLONOSCOPY
Anesthesia: Moderate Sedation

## 2017-12-20 MED ORDER — MEPERIDINE HCL 50 MG/ML IJ SOLN
INTRAMUSCULAR | Status: DC | PRN
  Administered 2017-12-20 (×3): 25 mg via INTRAVENOUS

## 2017-12-20 MED ORDER — STERILE WATER FOR IRRIGATION IR SOLN
Status: DC | PRN
  Administered 2017-12-20: 1.5 mL

## 2017-12-20 MED ORDER — MEPERIDINE HCL 50 MG/ML IJ SOLN
INTRAMUSCULAR | Status: AC
  Filled 2017-12-20: qty 1

## 2017-12-20 MED ORDER — SODIUM CHLORIDE 0.9 % IV SOLN
INTRAVENOUS | Status: DC
  Administered 2017-12-20: 1000 mL via INTRAVENOUS

## 2017-12-20 MED ORDER — MIDAZOLAM HCL 5 MG/5ML IJ SOLN
INTRAMUSCULAR | Status: DC | PRN
  Administered 2017-12-20: 1 mg via INTRAVENOUS
  Administered 2017-12-20 (×4): 2 mg via INTRAVENOUS

## 2017-12-20 MED ORDER — MIDAZOLAM HCL 5 MG/5ML IJ SOLN
INTRAMUSCULAR | Status: AC
  Filled 2017-12-20: qty 10

## 2017-12-20 NOTE — H&P (Signed)
Desiree Hogan is an 62 y.o. female.   Chief Complaint: Patient is here for colonoscopy. HPI: Patient is 62 year old Caucasian female who presented over 3 months ago with abdominal pain and was diagnosed and treated for sigmoid diverticulitis.  She responded to antibiotic therapy.  Patient states she was diagnosed with colitis when she had colonoscopy in January 2014.  She was initially told that she had Crohn's disease but then she was told she had colitis and was maintained on Asacol for 8 months.  She decided not see Dr. Britta Mccreedy in follow-up and therefore medication was not renewed.  She states her bowels been irregular.  She has diarrhea constipation and then normal stools.  She has history of intermittent rectal bleeding which dates back to 2014.  She complains of abdominal pain on the right side.  Her last colonoscopy she also had 2 polyps removed but they were not adenomas. Says her mother had surgery at age 11 and she was found to have advanced carcinoma and primary could never be determined.  She died shortly thereafter.  Past Medical History:  Diagnosis Date  . Abdominal pain   . Anemia   . Back pain   . Colitis   . COPD (chronic obstructive pulmonary disease) (HCC)    not on home o2  . Crohn's disease ([place]) 03/29/12  . Diabetes mellitus without complication ([place])   . Fibromyalgia   . H/O breast biopsy   . Hip pain   . Hyperlipidemia   . Hypothyroidism   . Irritable bowel syndrome   . Joint pain   . Nonspecific abnormal finding in stool contents   . Peripheral vascular disease ([place])   . Ulcer    Mouth    Past Surgical History:  Procedure Laterality Date  . CESAREAN SECTION  06/24/82  . CHOLECYSTECTOMY  1993   Gall Bladder  . COLONOSCOPY  Jan. 31, 2014  . [place] SURGERY  2010 and 2011  . TONSILLECTOMY      Family History  Problem Relation Age of Onset  . Cancer Mother        Colon or vaginal ?  . Deep vein thrombosis Mother   . Hypertension Father   . Diabetes Father    . Heart disease Father        Heart Disease before age 28  . Diabetes Sister   . Diabetes Brother   . Hypertension Brother    Social History:  reports that she quit smoking about 8 years ago. Her smoking use included cigarettes. She has a 15.00 pack-year smoking history. She has never used smokeless tobacco. She reports that she does not drink alcohol or use drugs.  Allergies:  Allergies  Allergen Reactions  . Bactrim [Sulfamethoxazole-Trimethoprim] Itching    And sores  . Penicillins Anaphylaxis    Has patient had a PCN reaction causing immediate rash, facial/tongue/throat swelling, SOB or lightheadedness with hypotension: Yes Has patient had a PCN reaction causing severe rash involving mucus membranes or skin necrosis: Yes Has patient had a PCN reaction that required hospitalization: No Has patient had a PCN reaction occurring within the last 10 years: No If all of the above answers are "NO", then may proceed with Cephalosporin use.   . Tetracyclines & Related Swelling and Rash    Medications Prior to Admission  Medication Sig Dispense Refill  . alendronate (FOSAMAX) 70 MG tablet Take 70 mg by mouth every Friday. Take with a full glass of water on an empty stomach.     Marland Kitchen  BREO ELLIPTA 100-25 MCG/INH AEPB Inhale 1 puff into the lungs daily.     Marland Kitchen CALCIUM GLUCONATE PO Take 600 mg by mouth 2 (two) times daily.     . cetirizine (ZYRTEC) 10 MG tablet Take 10 mg by mouth daily.    . cholecalciferol (VITAMIN D) 1000 units tablet Take 1,000 Units by mouth 2 (two) times daily.     . DULoxetine (CYMBALTA) 30 MG capsule TAKE 1 CAPSULE BY MOUTH EVERY DAY 90 capsule 0  . gabapentin (NEURONTIN) 300 MG capsule Take 300 mg by mouth 3 (three) times daily.    Marland Kitchen gemfibrozil (LOPID) 600 MG tablet Take 600 mg by mouth 2 (two) times daily before a meal.    . levothyroxine (SYNTHROID, LEVOTHROID) 125 MCG tablet Take 125 mcg by mouth daily.  3  . PROAIR HFA 108 (90 Base) MCG/ACT inhaler Inhale 1-2 puffs  into the lungs every 6 (six) hours as needed for wheezing or shortness of breath.     . rosuvastatin (CRESTOR) 10 MG tablet Take 10 mg by mouth daily.  3  . tiZANidine (ZANAFLEX) 4 MG tablet TAKE 1 TABLET BY MOUTH EVERYDAY AT BEDTIME (Patient taking differently: Take 4 mg by mouth at bedtime. ) 90 tablet 0  . traMADol (ULTRAM) 50 MG tablet Take 1 tablet (50 mg total) by mouth every 6 (six) hours as needed. (Patient taking differently: Take 50 mg by mouth every 6 (six) hours as needed (pain). ) 90 tablet 0  . ipratropium (ATROVENT) 0.06 % nasal spray Place 1 spray into both nostrils daily as needed for rhinitis.       Results for orders placed or performed during the hospital encounter of 12/20/17 (from the past 48 hour(s))  Glucose, capillary     Status: Abnormal   Collection Time: 12/20/17  1:41 PM  Result Value Ref Range   Glucose-Capillary 116 (H) 70 - 99 mg/dL   No results found.  ROS  Blood pressure 136/73, pulse 90, temperature 97.9 F (36.6 C), temperature source Oral, resp. rate 12, height 5' 4"  (1.626 m), weight 81.6 kg, SpO2 99 %. Physical Exam  Constitutional: She appears well-developed and well-nourished.  HENT:  Mouth/Throat: Oropharynx is clear and moist.  Eyes: Conjunctivae are normal. No scleral icterus.  Neck: No thyromegaly present.  Cardiovascular: Normal rate, regular rhythm and normal heart sounds.  No murmur heard. Respiratory: Effort normal and breath sounds normal.  GI:  Abdomen is symmetrical with Pfannenstiel scar.  She also has laparoscopy scars from cholecystectomy.  Abdomen is soft.  She has mild tenderness at RUQ and RLQ.  No organomegaly or masses.  Musculoskeletal:        General: No edema.  Lymphadenopathy:    She has no cervical adenopathy.  Neurological: She is alert.  Skin: Skin is dry.     Assessment/Plan History of diverticulitis and colitis. Diagnostic colonoscopy.  Hildred Laser, MD 12/20/2017, 2:40 PM

## 2017-12-20 NOTE — Discharge Instructions (Signed)
No aspirin or NSAIDs for 24 hours. Resume usual medications as before. Modified carb high-fiber diet. No driving for 24 hours. Physician will call with biopsy results.     Colonoscopy, Adult, Care After This sheet gives you information about how to care for yourself after your procedure. Your doctor may also give you more specific instructions. If you have problems or questions, call your doctor. What can I expect after the procedure? After the procedure, it is common to have:  A small amount of blood in your poop for 24 hours.  Some gas.  Mild cramping or bloating in your belly. Follow these instructions at home: General instructions  For the first 24 hours after the procedure: ? Do not drive or use machinery. ? Do not sign important documents. ? Do not drink alcohol. ? Do your daily activities more slowly than normal. ? Eat foods that are soft and easy to digest.  Take over-the-counter or prescription medicines only as told by your doctor. To help cramping and bloating:   Try walking around.  Put heat on your belly (abdomen) as told by your doctor. Use a heat source that your doctor recommends, such as a moist heat pack or a heating pad. ? Put a towel between your skin and the heat source. ? Leave the heat on for 20-30 minutes. ? Remove the heat if your skin turns bright red. This is especially important if you cannot feel pain, heat, or cold. You can get burned. Eating and drinking   Drink enough fluid to keep your pee (urine) clear or pale yellow.  Return to your normal diet as told by your doctor. Avoid heavy or fried foods that are hard to digest.  Avoid drinking alcohol for as long as told by your doctor. Contact a doctor if:  You have blood in your poop (stool) 2-3 days after the procedure. Get help right away if:  You have more than a small amount of blood in your poop.  You see large clumps of tissue (blood clots) in your poop.  Your belly is  swollen.  You feel sick to your stomach (nauseous).  You throw up (vomit).  You have a fever.  You have belly pain that gets worse, and medicine does not help your pain. Summary  After the procedure, it is common to have a small amount of blood in your poop. You may also have mild cramping and bloating in your belly.  For the first 24 hours after the procedure, do not drive or use machinery, do not sign important documents, and do not drink alcohol.  Get help right away if you have a lot of blood in your poop, feel sick to your stomach, have a fever, or have more belly pain. This information is not intended to replace advice given to you by your health care provider. Make sure you discuss any questions you have with your health care provider. Document Released: 01/22/2010 Document Revised: 10/20/2016 Document Reviewed: 09/14/2015 Elsevier Interactive Patient Education  2019 [employer] Inc.    High-Fiber Diet Fiber, also called dietary fiber, is a type of carbohydrate that is found in fruits, vegetables, whole grains, and beans. A high-fiber diet can have many health benefits. Your health care provider may recommend a high-fiber diet to help:  Prevent constipation. Fiber can make your bowel movements more regular.  Lower your cholesterol.  Relieve the following conditions: ? Swelling of veins in the anus (hemorrhoids). ? Swelling and irritation (inflammation) of specific areas of  the digestive tract (uncomplicated diverticulosis). ? A problem of the large intestine (colon) that sometimes causes pain and diarrhea (irritable bowel syndrome, IBS).  Prevent overeating as part of a weight-loss plan.  Prevent heart disease, type 2 diabetes, and certain cancers. What is my plan? The recommended daily fiber intake in grams (g) includes:  38 g for men age 28 or younger.  30 g for men over age 36.  46 g for women age 41 or younger.  21 g for women over age 32. You can get the  recommended daily intake of dietary fiber by:  Eating a variety of fruits, vegetables, grains, and beans.  Taking a fiber supplement, if it is not possible to get enough fiber through your diet. What do I need to know about a high-fiber diet?  It is better to get fiber through food sources rather than from fiber supplements. There is not a lot of research about how effective supplements are.  Always check the fiber content on the nutrition facts label of any prepackaged food. Look for foods that contain 5 g of fiber or more per serving.  Talk with a diet and nutrition specialist (dietitian) if you have questions about specific foods that are recommended or not recommended for your medical condition, especially if those foods are not listed below.  Gradually increase how much fiber you consume. If you increase your intake of dietary fiber too quickly, you may have bloating, cramping, or gas.  Drink plenty of water. Water helps you to digest fiber. What are tips for following this plan?  Eat a wide variety of high-fiber foods.  Make sure that half of the grains that you eat each day are whole grains.  Eat breads and cereals that are made with whole-grain flour instead of refined flour or white flour.  Eat brown rice, bulgur wheat, or millet instead of white rice.  Start the day with a breakfast that is high in fiber, such as a cereal that contains 5 g of fiber or more per serving.  Use beans in place of meat in soups, salads, and pasta dishes.  Eat high-fiber snacks, such as berries, raw vegetables, nuts, and popcorn.  Choose whole fruits and vegetables instead of processed forms like juice or sauce. What foods can I eat?  Fruits Berries. Pears. Apples. Oranges. Avocado. Prunes and raisins. Dried figs. Vegetables Sweet potatoes. Spinach. Kale. Artichokes. Cabbage. Broccoli. Cauliflower. Green peas. Carrots. Squash. Grains Whole-grain breads. Multigrain cereal. Oats and oatmeal.  Brown rice. Barley. Bulgur wheat. [place]. Quinoa. Bran muffins. Popcorn. Rye wafer crackers. Meats and other proteins Navy, kidney, and pinto beans. Soybeans. Split peas. Lentils. Nuts and seeds. Dairy Fiber-fortified yogurt. Beverages Fiber-fortified soy milk. Fiber-fortified orange juice. Other foods Fiber bars. The items listed above may not be a complete list of recommended foods and beverages. Contact a dietitian for more options. What foods are not recommended? Fruits Fruit juice. Cooked, strained fruit. Vegetables Fried potatoes. Canned vegetables. Well-cooked vegetables. Grains White bread. Pasta made with refined flour. White rice. Meats and other proteins Fatty cuts of meat. Fried chicken or fried fish. Dairy Milk. Yogurt. Cream cheese. Sour cream. Fats and oils Butters. Beverages Soft drinks. Other foods Cakes and pastries. The items listed above may not be a complete list of foods and beverages to avoid. Contact a dietitian for more information. Summary  Fiber is a type of carbohydrate. It is found in fruits, vegetables, whole grains, and beans.  There are many health benefits of eating  a high-fiber diet, such as preventing constipation, lowering blood cholesterol, helping with weight loss, and reducing your risk of heart disease, diabetes, and certain cancers.  Gradually increase your intake of fiber. Increasing too fast can result in cramping, bloating, and gas. Drink plenty of water while you increase your fiber.  The best sources of fiber include whole fruits and vegetables, whole grains, nuts, seeds, and beans. This information is not intended to replace advice given to you by your health care provider. Make sure you discuss any questions you have with your health care provider. Document Released: 12/20/2004 Document Revised: 10/24/2016 Document Reviewed: 10/24/2016 Elsevier Interactive Patient Education  2019 [employer].     Diverticulosis  Diverticulosis is a condition that develops when small pouches (diverticula) form in the wall of the large intestine (colon). The colon is where water is absorbed and stool is formed. The pouches form when the inside layer of the colon pushes through weak spots in the outer layers of the colon. You may have a few pouches or many of them. What are the causes? The cause of this condition is not known. What increases the risk? The following factors may make you more likely to develop this condition:  Being older than age 60. Your risk for this condition increases with age. Diverticulosis is rare among people younger than age 47. By age 55, many people have it.  Eating a low-fiber diet.  Having frequent constipation.  Being overweight.  Not getting enough exercise.  Smoking.  Taking over-the-counter pain medicines, like aspirin and ibuprofen.  Having a family history of diverticulosis. What are the signs or symptoms? In most people, there are no symptoms of this condition. If you do have symptoms, they may include:  Bloating.  Cramps in the abdomen.  Constipation or diarrhea.  Pain in the lower left side of the abdomen. How is this diagnosed? This condition is most often diagnosed during an exam for other colon problems. Because diverticulosis usually has no symptoms, it often cannot be diagnosed independently. This condition may be diagnosed by:  Using a flexible scope to examine the colon (colonoscopy).  Taking an X-ray of the colon after dye has been put into the colon (barium enema).  Doing a CT scan. How is this treated? You may not need treatment for this condition if you have never developed an infection related to diverticulosis. If you have had an infection before, treatment may include:  Eating a high-fiber diet. This may include eating more fruits, vegetables, and grains.  Taking a fiber supplement.  Taking a live bacteria  supplement (probiotic).  Taking medicine to relax your colon.  Taking antibiotic medicines. Follow these instructions at home:  Drink 6-8 glasses of water or more each day to prevent constipation.  Try not to strain when you have a bowel movement.  If you have had an infection before: ? Eat more fiber as directed by your health care provider or your diet and nutrition specialist (dietitian). ? Take a fiber supplement or probiotic, if your health care provider approves.  Take over-the-counter and prescription medicines only as told by your health care provider.  If you were prescribed an antibiotic, take it as told by your health care provider. Do not stop taking the antibiotic even if you start to feel better.  Keep all follow-up visits as told by your health care provider. This is important. Contact a health care provider if:  You have pain in your abdomen.  You have bloating.  You have cramps.  You have not had a bowel movement in 3 days. Get help right away if:  Your pain gets worse.  Your bloating becomes very bad.  You have a fever or chills, and your symptoms suddenly get worse.  You vomit.  You have bowel movements that are bloody or black.  You have bleeding from your rectum. Summary  Diverticulosis is a condition that develops when small pouches (diverticula) form in the wall of the large intestine (colon).  You may have a few pouches or many of them.  This condition is most often diagnosed during an exam for other colon problems.  If you have had an infection related to diverticulosis, treatment may include increasing the fiber in your diet, taking supplements, or taking medicines. This information is not intended to replace advice given to you by your health care provider. Make sure you discuss any questions you have with your health care provider. Document Released: 09/17/2003 Document Revised: 11/09/2015 Document Reviewed: 11/09/2015 Elsevier  Interactive Patient Education  2019 [employer].

## 2017-12-20 NOTE — Op Note (Signed)
[facility] Patient Name: Desiree Hogan Procedure Date: 12/20/2017 2:22 PM MRN: [id number] Date of Birth: 07/03/1955 Attending MD: Hildred Laser , MD CSN: [id number] Age: 62 Admit Type: Outpatient Procedure:                Colonoscopy Indications:              Follow-up of colitis, Follow-up of diverticulitis Providers:                Hildred Laser, MD, Charlsie Quest. Theda Sers RN, RN, Nelma Rothman, Technician Referring MD:             Glenda Chroman, MD Medicines:                Meperidine 75 mg IV, Midazolam 9 mg IV Complications:            No immediate complications. Estimated Blood Loss:     Estimated blood loss was minimal. Procedure:                Pre-Anesthesia Assessment:                           - Prior to the procedure, a History and Physical                            was performed, and patient medications and                            allergies were reviewed. The patient's tolerance of                            previous anesthesia was also reviewed. The risks                            and benefits of the procedure and the sedation                            options and risks were discussed with the patient.                            All questions were answered, and informed consent                            was obtained. Prior Anticoagulants: The patient has                            taken no previous anticoagulant or antiplatelet                            agents. ASA Grade Assessment: II - A patient with                            mild systemic disease. After reviewing the risks  and benefits, the patient was deemed in                            satisfactory condition to undergo the procedure.                           After obtaining informed consent, the colonoscope                            was passed under direct vision. Throughout the                            procedure, the patient's blood pressure, pulse,  and                            oxygen saturations were monitored continuously. The                            [id number]-[id number] ([id number]) scope was introduced through                            the anus and advanced to the the cecum, identified                            by appendiceal orifice and ileocecal valve. The                            ileocecal valve, appendiceal orifice, and rectum                            were photographed. Scope In: 2:53:47 PM Scope Out: 3:25:21 PM Scope Withdrawal Time: 0 hours 11 minutes 30 seconds  Total Procedure Duration: 0 hours 31 minutes 34 seconds  Findings:      The perianal and digital rectal examinations were normal.      Multiple diffuse non-bleeding erosions were found in the cecum. Biopsies       were taken with a cold forceps for histology. The pathology specimen was       placed into Bottle Number 1.      A scar was found at the hepatic flexure.      A few diverticula were found in the descending colon and transverse       colon.      Multiple small and large-mouthed diverticula were found in the sigmoid       colon.      Internal hemorrhoids were found during retroflexion. The hemorrhoids       were medium-sized. Impression:               - Multiple erosions in the cecum. Biopsied.                           - Scar at the hepatic flexure indicative of healed                            ulcer.                           -  Diverticulosis in the descending colon and in the                            transverse colon.                           - Diverticulosis in the sigmoid colon.                           - Internal hemorrhoids. Moderate Sedation:      Moderate (conscious) sedation was administered by the endoscopy nurse       and supervised by the endoscopist. The following parameters were       monitored: oxygen saturation, heart rate, blood pressure, CO2       capnography and response to care. Total physician intraservice time was       40  minutes. Recommendation:           - Patient has a contact number available for                            emergencies. The signs and symptoms of potential                            delayed complications were discussed with the                            patient. Return to normal activities tomorrow.                            Written discharge instructions were provided to the                            patient.                           - High fiber diet and diabetic (ADA) diet today.                           - Continue present medications.                           - No aspirin, ibuprofen, naproxen, or other                            non-steroidal anti-inflammatory drugs for 1 day.                           - Await pathology results.                           - Repeat colonoscopy is recommended. The                            colonoscopy date will be determined after pathology  results from today's exam become available for                            review. Procedure Code(s):        --- Professional ---                           [phone], Colonoscopy, flexible; with biopsy, single                            or multiple                           99153, Moderate sedation; each additional 15                            minutes intraservice time                           99153, Moderate sedation; each additional 15                            minutes intraservice time                           G0500, Moderate sedation services provided by the                            same physician or other qualified health care                            professional performing a gastrointestinal                            endoscopic service that sedation supports,                            requiring the presence of an independent trained                            observer to assist in the monitoring of the                            patient's level of consciousness and  physiological                            status; initial 15 minutes of intra-service time;                            patient age 19 years or older (additional time may                            be reported with [phone], as appropriate) Diagnosis Code(s):        --- Professional ---  K63.3, Ulcer of intestine                           K63.89, Other specified diseases of intestine                           K64.8, Other hemorrhoids                           K52.9, Noninfective gastroenteritis and colitis,                            unspecified                           K57.32, Diverticulitis of large intestine without                            perforation or abscess without bleeding                           K57.30, Diverticulosis of large intestine without                            perforation or abscess without bleeding CPT copyright 2018 American [employer] Association. All rights reserved. The codes documented in this report are preliminary and upon coder review may  be revised to meet current compliance requirements. Hildred Laser, MD Hildred Laser, MD 12/20/2017 3:42:48 PM This report has been signed electronically. Number of Addenda: 0

## 2017-12-25 ENCOUNTER — Other Ambulatory Visit (INDEPENDENT_AMBULATORY_CARE_PROVIDER_SITE_OTHER): Payer: Self-pay | Admitting: Internal Medicine

## 2017-12-25 DIAGNOSIS — Z1231 Encounter for screening mammogram for malignant neoplasm of breast: Secondary | ICD-10-CM | POA: Diagnosis not present

## 2017-12-25 MED ORDER — MESALAMINE 400 MG PO CPDR: 800.0000 mg | DELAYED_RELEASE_CAPSULE | Freq: Two times a day (BID) | ORAL | 5 refills | Status: DC

## 2017-12-26 ENCOUNTER — Encounter (HOSPITAL_COMMUNITY): Payer: Self-pay | Admitting: Internal Medicine

## 2017-12-29 ENCOUNTER — Other Ambulatory Visit (INDEPENDENT_AMBULATORY_CARE_PROVIDER_SITE_OTHER): Payer: Self-pay | Admitting: *Deleted

## 2017-12-29 DIAGNOSIS — K5090 Crohn's disease, unspecified, without complications: Secondary | ICD-10-CM

## 2018-01-03 LAB — IBD EXPANDED PANEL
ACCA: 7 units (ref 0–90)
ALCA: 4 units (ref 0–60)
AMCA: 17 units (ref 0–100)
Atypical pANCA: NEGATIVE
gASCA: 22 units (ref 0–50)

## 2018-01-05 NOTE — Progress Notes (Signed)
Office Visit Note  Patient: Desiree Hogan             Date of Birth: [birth year]           MRN: [id number]             PCP: Glenda Chroman, MD Referring: Glenda Chroman, MD Visit Date: 01/19/2018 Occupation: @GUAROCC @  Subjective:  Trapezius muscle spasms bilaterally  History of Present Illness: Desiree Hogan is a 63 y.o. female with history of fibromyalgia, osteoarthritis, and DDD.  She is taking Cymbalta 30 mg by mouth daily, Zanaflex 4 mg by mouth at bedtime for muscle spasms, and tramadol 50 mg 1 tablet by mouth every 6 hours as needed for pain relief.  She feels as though this is been a good combination of medications for her.  She states that her fibromyalgia has been flaring more frequently due to the recent weather changes.  She states that she continues to have chronic lower back pain as well as trapezius muscle spasms.  She reports that she continues have insomnia.  She has interrupted sleep at night due to the discomfort she experiences.  She states that her level of fatigue has been stable overall.  She denies any joint pain or joint swelling at this time.  She would like trigger point injections today in the office. She continues to take Fosamax 70 mg once weekly for osteoporosis.   Activities of Daily Living:  Patient reports morning stiffness for 45 minutes.   Patient Reports nocturnal pain.  Difficulty dressing/grooming: Denies Difficulty climbing stairs: Denies Difficulty getting out of chair: Denies Difficulty using hands for taps, buttons, cutlery, and/or writing: Denies  Review of Systems  Constitutional: Positive for fatigue.  HENT: Negative for mouth sores, mouth dryness and nose dryness.   Eyes: Negative for pain, visual disturbance and dryness.  Respiratory: Negative for cough, hemoptysis, shortness of breath and difficulty breathing.   Cardiovascular: Negative for chest pain, palpitations, hypertension and swelling in legs/feet.  Gastrointestinal: Negative for  blood in stool, constipation and diarrhea.  Endocrine: Negative for increased urination.  Genitourinary: Negative for painful urination.  Musculoskeletal: Positive for arthralgias, joint pain, myalgias, morning stiffness, muscle tenderness and myalgias. Negative for joint swelling and muscle weakness.  Skin: Negative for color change, pallor, rash, hair loss, nodules/bumps, skin tightness, ulcers and sensitivity to sunlight.  Allergic/Immunologic: Negative for susceptible to infections.  Neurological: Negative for dizziness, numbness, headaches and weakness.  Hematological: Negative for swollen glands.  Psychiatric/Behavioral: Positive for sleep disturbance. Negative for depressed mood. The patient is not nervous/anxious.     PMFS History:  Patient Active Problem List   Diagnosis Date Noted  . Diverticulitis of colon 10/11/2017  . Crohn's disease of large intestine without complication ([place]) 25/95/6387  . Fibromyalgia 07/14/2017  . ANA positive 07/14/2017  . Primary osteoarthritis of both knees 07/14/2017  . DDD (degenerative disc disease), lumbar 07/14/2017  . Hypermobility of joint 07/14/2017  . Age-related osteoporosis without current pathological fracture 07/14/2017  . Other insomnia 07/14/2017  . History of Crohn's disease 07/14/2017  . History of IBS 07/14/2017  . History of hypercholesterolemia 07/14/2017  . History of COPD 07/14/2017  . History of cholecystectomy 07/14/2017  . Community acquired pneumonia 12/13/2016  . Pneumonia 12/13/2016  . Leukocytosis 12/13/2016  . Hypokalemia 12/13/2016  . Diabetes mellitus without complication ([place]) 56/43/3295    Past Medical History:  Diagnosis Date  . Abdominal pain   . Anemia   . Back pain   .  Colitis   . COPD (chronic obstructive pulmonary disease) ([facility])    not on home o2  . Crohn's disease ([place]) 03/29/12  . Diabetes mellitus without complication ([place])   . Fibromyalgia   . H/O breast biopsy   . Hip pain   .  Hyperlipidemia   . Hypothyroidism   . Irritable bowel syndrome   . Joint pain   . Nonspecific abnormal finding in stool contents   . Peripheral vascular disease ([place])   . Ulcer    Mouth    Family History  Problem Relation Age of Onset  . Cancer Mother        Colon or vaginal ?  . Deep vein thrombosis Mother   . Hypertension Father   . Diabetes Father   . Heart disease Father        Heart Disease before age 22  . Diabetes Sister   . Diabetes Brother   . Hypertension Brother    Past Surgical History:  Procedure Laterality Date  . BIOPSY  12/20/2017   Procedure: BIOPSY;  Surgeon: Rogene Houston, MD;  Location: [facility];  Service: Endoscopy;;  cecal erosions  . CESAREAN SECTION  06/24/82  . CHOLECYSTECTOMY  1993   Gall Bladder  . COLONOSCOPY  Jan. 31, 2014  . COLONOSCOPY N/A 12/20/2017   Procedure: COLONOSCOPY;  Surgeon: Rogene Houston, MD;  Location: [facility];  Service: Endoscopy;  Laterality: N/A;  2:25  . [place] SURGERY  2010 and 2011  . TONSILLECTOMY     Social History   Social History Narrative  . Not on file    Objective: Vital Signs: BP (!) 148/92 (BP Location: Left Arm, Patient Position: Sitting, Cuff Size: Normal)   Pulse 90   Resp 12   Ht 5' 3"  (1.6 m)   Wt 188 lb (85.3 kg)   BMI 33.30 kg/m    Physical Exam Vitals signs and nursing note reviewed.  Constitutional:      Appearance: She is well-developed.  HENT:     Head: Normocephalic and atraumatic.  Eyes:     Conjunctiva/sclera: Conjunctivae normal.  Neck:     Musculoskeletal: Normal range of motion.  Cardiovascular:     Rate and Rhythm: Normal rate and regular rhythm.     Heart sounds: Normal heart sounds.  Pulmonary:     Effort: Pulmonary effort is normal.     Breath sounds: Normal breath sounds.  Abdominal:     General: Bowel sounds are normal.     Palpations: Abdomen is soft.  Lymphadenopathy:     Cervical: No cervical adenopathy.  Skin:    General: Skin is warm and dry.      Capillary Refill: Capillary refill takes less than 2 seconds.  Neurological:     Mental Status: She is alert and oriented to person, place, and time.  Psychiatric:        Behavior: Behavior normal.      Musculoskeletal Exam: C-spine limited range of motion.  Trapezius muscle spasms bilaterally.  Thoracic and lumbar spine good range of motion.  Midline spinal tenderness in lumbar region.  No SI joint tenderness.  Shoulder joints limited abduction to 90 degrees bilaterally with discomfort. Elbow joints, wrist joints, MCPs, PIPs, DIPs good range of motion with no synovitis.  She has PIP and DIP synovial thickening consistent with osteoarthritis of bilateral hands.  She has complete fist formation bilaterally.  Hip joints good range of motion.  Tenderness over bilateral trochanter bursa.  Knee  joints good range of motion with no warmth or effusion.  She has bilateral knee crepitus.  No tenderness or swelling of ankle joints.  No tenderness of MTP joints.  CDAI Exam: CDAI Score: Not documented Patient Global Assessment: Not documented; Provider Global Assessment: Not documented Swollen: Not documented; Tender: Not documented Joint Exam   Not documented   There is currently no information documented on the homunculus. Go to the Rheumatology activity and complete the homunculus joint exam.  Investigation: No additional findings.  Imaging: No results found.  Recent Labs: Lab Results  Component Value Date   WBC 6.7 10/11/2017   HGB 12.2 10/11/2017   PLT 234 10/11/2017   NA 141 09/01/2017   K 3.9 09/01/2017   CL 107 09/01/2017   CO2 26 09/01/2017   GLUCOSE 120 (H) 09/01/2017   BUN 14 09/01/2017   CREATININE 0.79 09/01/2017   BILITOT 0.6 09/01/2017   ALKPHOS 64 09/01/2017   AST 28 09/01/2017   ALT 22 09/01/2017   PROT 7.5 09/01/2017   ALBUMIN 4.1 09/01/2017   CALCIUM 9.4 09/01/2017   GFRAA >60 09/01/2017    Speciality Comments: No specialty comments available.  Procedures:    Trigger Point Inj Date/Time: 01/19/2018 11:58 AM Performed by: Bo Merino, MD Authorized by: Bo Merino, MD   Consent Given by:  Patient Site marked: the procedure site was marked   Timeout: prior to procedure the correct patient, procedure, and site was verified   Indications:  Pain Total # of Trigger Points:  2 Location: neck   Needle Size:  27 G Approach:  Dorsal Medications #1:  0.5 mL lidocaine 1 %; 10 mg triamcinolone acetonide 40 MG/ML Medications #2:  0.5 mL lidocaine 1 %; 10 mg triamcinolone acetonide 40 MG/ML Patient tolerance:  Patient tolerated the procedure well with no immediate complications   Allergies: Bactrim [sulfamethoxazole-trimethoprim]; Penicillins; and Tetracyclines & related   Assessment / Plan:     Visit Diagnoses: Fibromyalgia -she has generalized hyperalgesia and positive tender points on exam.  She continues have generalized muscle aches and muscle tenderness due to fibromyalgia flares.  She has been flaring more frequently due to recent weather changes.  She continues to take Cymbalta 30 mg by mouth daily, Zanaflex 4 mg by mouth at bedtime for muscle spasms, and tramadol 50 mg 1 tablet by mouth every 6 hours for pain relief.  She does not need any refills of these medications at this time.  Narcotic agreement and urine drug screen will be updated today on 01/19/2018.  She has been having trapezius muscle spasms bilaterally.  She requested trigger point injections today in the office.  She tolerated procedure well.  Procedure note will be completed above.  She was encouraged to try to stay active and exercise on a regular basis.  She continues to have chronic fatigue and insomnia.  Good sleep hygiene was discussed.  She will follow-up in the office in 6 months.  Zanaflex, cymbalta, tramadol.   Medication monitoring encounter -tramadol 50 mg by mouth every 6 hours for pain relief.  UDS and narcotic agreement were updated today on 01/19/2018.  Plan: Pain  Mgmt, Profile 5 w/Conf, U, Pain Mgmt, Tramadol w/medMATCH, U  Trapezius muscle spasm: She is having trapezius muscle spasms bilaterally.  She has tenderness and tension.  She requested bilateral trigger point injections today in the office.  She tolerated the procedure well.  Procedure note is completed above.  She was advised to monitor her blood pressure closely following the  cortisone junction today.  She will continue taking Zanaflex 4 mg by mouth at bedtime for muscle spasms.  ANA positive - +RF, -CCP: She has no features of autoimmune disease at this time.  She has no synovitis on exam  Primary osteoarthritis of both knees: No warmth or effusion.  She has good range of motion.  She has bilateral knee crepitus.  She has no discomfort in any joints at this time.  DDD (degenerative disc disease), lumbar: Chronic pain.  She has limited range of motion with discomfort.  She has midline spinal tenderness in lumbar region.  She take Zanaflex 4 mg at bedtime for muscle spasms.  She takes tramadol 50 mg every 6 hours for pain relief.  Hypermobility of joint  Age-related osteoporosis without current pathological fracture - She is taking Fosamax 70 mg po once weekly.   Other insomnia: She continues to have interrupted sleep at night due to the discomfort she experiences.  Good sleep hygiene was discussed.  She continues to take Zanaflex 4 mg by mouth at bedtime for muscle spasms.  Other medical conditions are listed as follows:   History of COPD  History of IBS  History of hypercholesterolemia  History of Crohn's disease  History of cholecystectomy   Orders: Orders Placed This Encounter  Procedures  . Trigger Point Inj  . Pain Mgmt, Profile 5 w/Conf, U  . Pain Mgmt, Tramadol w/medMATCH, U   No orders of the defined types were placed in this encounter.   Face-to-face time spent with patient was 30 minutes. Greater than 50% of time was spent in counseling and coordination of  care.  Follow-Up Instructions: Return in about 6 months (around 07/20/2018) for Fibromyalgia, Osteoarthritis, DDD.   Ofilia Neas, PA-C   I examined and evaluated the patient with Hazel Sams PA.  She has been experiencing pain and discomfort in bilateral trapezius area.  Per her request after informed consent was obtained bilateral trapezius area was injected with cortisone.  I advised her to monitor blood pressure closely.  She continues to have some discomfort from fibromyalgia.  The plan of care was discussed as noted above.  Bo Merino, MD  Note - This record has been created using Editor, commissioning.  Chart creation errors have been sought, but may not always  have been located. Such creation errors do not reflect on  the standard of medical care.

## 2018-01-11 ENCOUNTER — Other Ambulatory Visit: Payer: Self-pay | Admitting: Rheumatology

## 2018-01-11 ENCOUNTER — Other Ambulatory Visit (INDEPENDENT_AMBULATORY_CARE_PROVIDER_SITE_OTHER): Payer: Self-pay | Admitting: Physician Assistant

## 2018-01-11 MED ORDER — TRAMADOL HCL 50 MG PO TABS: 50.0000 mg | ORAL_TABLET | Freq: Four times a day (QID) | ORAL | 0 refills | Status: DC | PRN

## 2018-01-11 NOTE — Telephone Encounter (Signed)
Last visit: 07/14/2017 Next visit: 01/19/2018 UDS: 07/14/17 Narc agreement: 07/14/17  Patient to update UDS and narc agreement at appointment on 07/14/17  Okay to refill Tramadol?

## 2018-01-11 NOTE — Telephone Encounter (Signed)
ok 

## 2018-01-11 NOTE — Telephone Encounter (Signed)
Patient request a refill on Tramadol sent to [facility] in [place].

## 2018-01-12 ENCOUNTER — Telehealth: Payer: Self-pay

## 2018-01-12 NOTE — Telephone Encounter (Signed)
Patient called and states [facility] did not receive the [facility] prescription that was sent yesterday. Please advise.

## 2018-01-12 NOTE — Telephone Encounter (Signed)
Patient advised prescription was faxed 01/11/18 and re faxed today. Patient to call if pharmacy still does not received prescription.

## 2018-01-19 ENCOUNTER — Ambulatory Visit (INDEPENDENT_AMBULATORY_CARE_PROVIDER_SITE_OTHER): Payer: No Typology Code available for payment source | Admitting: Rheumatology

## 2018-01-19 ENCOUNTER — Encounter: Payer: Self-pay | Admitting: Rheumatology

## 2018-01-19 VITALS — BP 148/92 | HR 90 | Resp 12 | Ht 63.0 in | Wt 188.0 lb

## 2018-01-19 DIAGNOSIS — G4709 Other insomnia: Secondary | ICD-10-CM | POA: Diagnosis not present

## 2018-01-19 DIAGNOSIS — R768 Other specified abnormal immunological findings in serum: Secondary | ICD-10-CM | POA: Diagnosis not present

## 2018-01-19 DIAGNOSIS — M797 Fibromyalgia: Secondary | ICD-10-CM | POA: Diagnosis not present

## 2018-01-19 DIAGNOSIS — M810 Age-related osteoporosis without current pathological fracture: Secondary | ICD-10-CM

## 2018-01-19 DIAGNOSIS — Z8639 Personal history of other endocrine, nutritional and metabolic disease: Secondary | ICD-10-CM

## 2018-01-19 DIAGNOSIS — Z8709 Personal history of other diseases of the respiratory system: Secondary | ICD-10-CM

## 2018-01-19 DIAGNOSIS — M5136 Other intervertebral disc degeneration, lumbar region: Secondary | ICD-10-CM

## 2018-01-19 DIAGNOSIS — Z9049 Acquired absence of other specified parts of digestive tract: Secondary | ICD-10-CM

## 2018-01-19 DIAGNOSIS — Z8719 Personal history of other diseases of the digestive system: Secondary | ICD-10-CM | POA: Diagnosis not present

## 2018-01-19 DIAGNOSIS — M249 Joint derangement, unspecified: Secondary | ICD-10-CM | POA: Diagnosis not present

## 2018-01-19 DIAGNOSIS — M62838 Other muscle spasm: Secondary | ICD-10-CM | POA: Diagnosis not present

## 2018-01-19 DIAGNOSIS — Z5181 Encounter for therapeutic drug level monitoring: Secondary | ICD-10-CM

## 2018-01-19 DIAGNOSIS — M170 Bilateral primary osteoarthritis of knee: Secondary | ICD-10-CM | POA: Diagnosis not present

## 2018-01-19 MED ORDER — TRIAMCINOLONE ACETONIDE 40 MG/ML IJ SUSP
10.0000 mg | INTRAMUSCULAR | Status: AC | PRN
  Administered 2018-01-19: 10 mg via INTRAMUSCULAR

## 2018-01-19 MED ORDER — LIDOCAINE HCL 1 % IJ SOLN
0.5000 mL | INTRAMUSCULAR | Status: AC | PRN
  Administered 2018-01-19: .5 mL

## 2018-01-21 LAB — PAIN MGMT, PROFILE 5 W/CONF, U
AMPHETAMINES: NEGATIVE ng/mL (ref <500)
Barbiturates: NEGATIVE ng/mL (ref <300)
Benzodiazepines: NEGATIVE ng/mL (ref <100)
COCAINE METABOLITE: NEGATIVE ng/mL (ref <150)
CREATININE: 27.1 mg/dL
Marijuana Metabolite: NEGATIVE ng/mL (ref <20)
Methadone Metabolite: NEGATIVE ng/mL (ref <100)
Opiates: NEGATIVE ng/mL (ref <100)
Oxidant: NEGATIVE ug/mL (ref <200)
Oxycodone: NEGATIVE ng/mL (ref <100)
pH: 6.57 (ref 4.5–9.0)

## 2018-01-21 LAB — PAIN MGMT, TRAMADOL W/MEDMATCH, U
Desmethyltramadol: 1024 ng/mL — ABNORMAL HIGH (ref <100)
Tramadol: 6351 ng/mL — ABNORMAL HIGH (ref <100)

## 2018-01-22 NOTE — Progress Notes (Signed)
UDS is consistent with treatment.

## 2018-02-09 ENCOUNTER — Telehealth: Payer: Self-pay | Admitting: Rheumatology

## 2018-02-09 MED ORDER — TRAMADOL HCL 50 MG PO TABS: 50.0000 mg | ORAL_TABLET | Freq: Three times a day (TID) | ORAL | 0 refills | Status: DC | PRN

## 2018-02-09 NOTE — Telephone Encounter (Signed)
Patient request a refill on Tramadol [facility] to [facility] [place]. Patient will be out tomorrow.

## 2018-02-09 NOTE — Telephone Encounter (Signed)
Last Visit: 01/19/18 Next Visit: 07/20/18 UDS:01/19/18 Narc Agreement: 01/19/18  Okay to refill Tramadol?

## 2018-02-19 ENCOUNTER — Other Ambulatory Visit: Payer: Self-pay | Admitting: Rheumatology

## 2018-02-20 NOTE — Telephone Encounter (Signed)
Last Visit: 01/19/2018 Next Visit: 07/20/2018   Okay to refill per Dr. Estanislado Pandy.

## 2018-02-27 ENCOUNTER — Other Ambulatory Visit: Payer: Self-pay | Admitting: Rheumatology

## 2018-02-27 NOTE — Telephone Encounter (Signed)
Last Visit: 01/19/2018 Next Visit: 07/20/2018   Okay to refill per Dr. Estanislado Pandy

## 2018-03-12 ENCOUNTER — Other Ambulatory Visit: Payer: Self-pay | Admitting: Rheumatology

## 2018-03-12 NOTE — Telephone Encounter (Signed)
Last Visit: 01/19/18 Next Visit: 07/20/18 UDS:01/19/18 Narc Agreement: 01/19/18  Okay to refill Tramadol?

## 2018-03-12 NOTE — Telephone Encounter (Signed)
Patient called requesting prescription refill of Tramadol [facility] [place] in [place].

## 2018-03-13 MED ORDER — TRAMADOL HCL 50 MG PO TABS: 50.0000 mg | ORAL_TABLET | Freq: Three times a day (TID) | ORAL | 0 refills | Status: DC | PRN

## 2018-03-13 NOTE — Telephone Encounter (Signed)
?  ok to refill °

## 2018-03-13 NOTE — Telephone Encounter (Signed)
ok 

## 2018-04-12 ENCOUNTER — Telehealth: Payer: Self-pay | Admitting: Rheumatology

## 2018-04-12 MED ORDER — TRAMADOL HCL 50 MG PO TABS: 50.0000 mg | ORAL_TABLET | Freq: Three times a day (TID) | ORAL | 0 refills | Status: DC | PRN

## 2018-04-12 NOTE — Telephone Encounter (Signed)
Patient called requesting prescription refill of Tramadol to be [facility] in [place].

## 2018-04-12 NOTE — Telephone Encounter (Signed)
Last Visit:01/19/18 Next Visit:07/20/18 UDS:01/19/18 Narc Agreement:01/19/18  Okay to refillTramadol?

## 2018-05-14 ENCOUNTER — Other Ambulatory Visit: Payer: Self-pay | Admitting: Rheumatology

## 2018-05-14 ENCOUNTER — Other Ambulatory Visit: Payer: Self-pay | Admitting: Physician Assistant

## 2018-05-14 MED ORDER — TRAMADOL HCL 50 MG PO TABS: 50.0000 mg | ORAL_TABLET | Freq: Three times a day (TID) | ORAL | 0 refills | Status: DC | PRN

## 2018-05-14 NOTE — Telephone Encounter (Signed)
Last Visit:01/19/18 Next Visit:07/20/18  Okay to refill per Dr. Estanislado Pandy

## 2018-05-14 NOTE — Telephone Encounter (Signed)
Patient called requesting prescription refill of Tramadol to [facility] [place].

## 2018-05-14 NOTE — Telephone Encounter (Signed)
Last Visit:01/19/18 Next Visit:07/20/18 UDS:01/19/18 Narc Agreement:01/19/18  Okay to refillTramadol?

## 2018-05-14 NOTE — Telephone Encounter (Signed)
Okay per Dr. Estanislado Pandy

## 2018-06-04 ENCOUNTER — Other Ambulatory Visit: Payer: Self-pay | Admitting: Rheumatology

## 2018-06-04 NOTE — Telephone Encounter (Signed)
Last visit: 01/19/2018 Next visit: 07/20/2018  Okay to refill per Dr. Estanislado Pandy.

## 2018-06-14 ENCOUNTER — Telehealth (INDEPENDENT_AMBULATORY_CARE_PROVIDER_SITE_OTHER): Payer: Self-pay | Admitting: *Deleted

## 2018-06-14 ENCOUNTER — Telehealth: Payer: Self-pay | Admitting: *Deleted

## 2018-06-14 NOTE — Telephone Encounter (Signed)
Patient called the office on 06/13/2018, asking that she be called. Patient was called this morning , and a message was left for her to call the office back.

## 2018-06-14 NOTE — Telephone Encounter (Signed)
Ok to refill Tramadol.

## 2018-06-14 NOTE — Telephone Encounter (Signed)
Last Visit: 01/19/2018 Next Visit: 07/20/2018 UDS:01/19/2018 Narc Agreement: 07/14/2017  Okay to refill tramadol?

## 2018-06-14 NOTE — Telephone Encounter (Signed)
Patient requesting refill on Tramadol

## 2018-06-14 NOTE — Telephone Encounter (Signed)
Dr. Arlean Hopping patient.

## 2018-06-15 ENCOUNTER — Other Ambulatory Visit: Payer: Self-pay | Admitting: *Deleted

## 2018-06-15 ENCOUNTER — Telehealth: Payer: Self-pay | Admitting: Rheumatology

## 2018-06-15 MED ORDER — TRAMADOL HCL 50 MG PO TABS: 50.0000 mg | ORAL_TABLET | Freq: Three times a day (TID) | ORAL | 0 refills | Status: DC | PRN

## 2018-06-15 NOTE — Telephone Encounter (Signed)
I called patient, RF Tramadol called in.

## 2018-06-15 NOTE — Telephone Encounter (Signed)
Patient called checking the status of her prescription refill of Tramadol to [facility] [place].  Patient states she is completely out of medication.  Please advise.

## 2018-06-30 ENCOUNTER — Other Ambulatory Visit (INDEPENDENT_AMBULATORY_CARE_PROVIDER_SITE_OTHER): Payer: Self-pay | Admitting: Internal Medicine

## 2018-07-09 NOTE — Progress Notes (Signed)
Office Visit Note  Patient: Desiree Hogan             Date of Birth: Jun 01, 1955           MRN: [id number]             PCP: Glenda Chroman, MD Referring: Glenda Chroman, MD Visit Date: 07/20/2018 Occupation: @GUAROCC @  Subjective:  Pain in neck and shoulders   History of Present Illness: Desiree Hogan is a 63 y.o. female with history of fibromyalgia and osteoarthritis.  She states she has been having off-and-on discomfort in her neck and shoulders.  The pain has increased recently.  She is having difficulty sleeping on the side due to hip discomfort.  None of the other joints are as painful.  She has some lower back and hip pain.  She continues to have some discomfort from fibromyalgia.  She is on tizanidine, Cymbalta and gabapentin.  She has been taking tramadol 1 tablet 3 times a day to control pain symptoms.  Patient reports that her pain level on 0-10 is about 8-9 without the tramadol and with the tramadol it comes down to 3.  Activities of Daily Living:  Patient reports morning stiffness for 45 minutes.   Patient Reports nocturnal pain.  Difficulty dressing/grooming: Denies Difficulty climbing stairs: Reports Difficulty getting out of chair: Denies Difficulty using hands for taps, buttons, cutlery, and/or writing: Reports  Review of Systems  Constitutional: Positive for fatigue. Negative for night sweats, weight gain and weight loss.  HENT: Positive for mouth dryness. Negative for mouth sores, trouble swallowing, trouble swallowing and nose dryness.   Eyes: Positive for dryness. Negative for pain, redness and visual disturbance.  Respiratory: Negative for cough, shortness of breath and difficulty breathing.   Cardiovascular: Negative for chest pain, palpitations, hypertension, irregular heartbeat and swelling in legs/feet.  Gastrointestinal: Negative for blood in stool, constipation and diarrhea.  Endocrine: Positive for heat intolerance. Negative for increased urination.   Genitourinary: Negative for difficulty urinating and vaginal dryness.  Musculoskeletal: Positive for arthralgias, joint pain, morning stiffness and muscle tenderness. Negative for joint swelling, myalgias, muscle weakness and myalgias.  Skin: Negative for color change, rash, hair loss, skin tightness, ulcers and sensitivity to sunlight.  Allergic/Immunologic: Negative for susceptible to infections.  Neurological: Positive for numbness and weakness. Negative for dizziness, memory loss and night sweats.  Hematological: Negative for bruising/bleeding tendency and swollen glands.  Psychiatric/Behavioral: Positive for sleep disturbance. Negative for depressed mood. The patient is not nervous/anxious.     PMFS History:  Patient Active Problem List   Diagnosis Date Noted  . Diverticulitis of colon 10/11/2017  . Crohn's colitis ([place]) 10/11/2017  . Fibromyalgia 07/14/2017  . ANA positive 07/14/2017  . Primary osteoarthritis of both knees 07/14/2017  . DDD (degenerative disc disease), lumbar 07/14/2017  . Hypermobility of joint 07/14/2017  . Age-related osteoporosis without current pathological fracture 07/14/2017  . Other insomnia 07/14/2017  . History of Crohn's disease 07/14/2017  . History of IBS 07/14/2017  . History of hypercholesterolemia 07/14/2017  . History of COPD 07/14/2017  . History of cholecystectomy 07/14/2017  . Community acquired pneumonia 12/13/2016  . Pneumonia 12/13/2016  . Leukocytosis 12/13/2016  . Hypokalemia 12/13/2016  . Diabetes mellitus without complication ([place]) 51/88/4166    Past Medical History:  Diagnosis Date  . Abdominal pain   . Anemia   . Back pain   . Colitis   . COPD (chronic obstructive pulmonary disease) ([facility])    not on  home o2  . Crohn's disease ([place]) 03/29/12  . Diabetes mellitus without complication ([place])   . Fibromyalgia   . H/O breast biopsy   . Hip pain   . Hyperlipidemia   . Hypothyroidism   . Irritable bowel syndrome   . Joint pain    . Nonspecific abnormal finding in stool contents   . Peripheral vascular disease ([place])   . Ulcer    Mouth    Family History  Problem Relation Age of Onset  . Cancer Mother        Colon or vaginal ?  . Deep vein thrombosis Mother   . Hypertension Father   . Diabetes Father   . Heart disease Father        Heart Disease before age 70  . Diabetes Sister   . Diabetes Brother   . Hypertension Brother    Past Surgical History:  Procedure Laterality Date  . BIOPSY  12/20/2017   Procedure: BIOPSY;  Surgeon: Rogene Houston, MD;  [facility]: [facility];  Service: Endoscopy;;  cecal erosions  . CESAREAN SECTION  06/24/82  . CHOLECYSTECTOMY  1993   Gall Bladder  . COLONOSCOPY  Jan. 31, 2014  . COLONOSCOPY N/A 12/20/2017   Procedure: COLONOSCOPY;  Surgeon: Rogene Houston, MD;  Location: [facility];  Service: Endoscopy;  Laterality: N/A;  2:25  . [place] SURGERY  2010 and 2011  . TONSILLECTOMY     Social History   Social History Narrative  . Not on file    There is no immunization history on file for this patient.   Objective: Vital Signs: BP 134/74 (BP Location: Left Arm, Patient Position: Sitting, Cuff Size: Normal)   Pulse 81   Resp 16   Ht 5' 3"  (1.6 m)   Wt 190 lb 6.4 oz (86.4 kg)   BMI 33.73 kg/m    Physical Exam Vitals signs and nursing note reviewed.  Constitutional:      Appearance: She is well-developed.  HENT:     Head: Normocephalic and atraumatic.  Eyes:     Conjunctiva/sclera: Conjunctivae normal.  Neck:     Musculoskeletal: Normal range of motion.  Cardiovascular:     Rate and Rhythm: Normal rate and regular rhythm.     Heart sounds: Normal heart sounds.  Pulmonary:     Effort: Pulmonary effort is normal.     Breath sounds: Normal breath sounds.  Abdominal:     General: Bowel sounds are normal.     Palpations: Abdomen is soft.  Lymphadenopathy:     Cervical: No cervical adenopathy.  Skin:    General: Skin is warm and dry.     Capillary  Refill: Capillary refill takes less than 2 seconds.  Neurological:     Mental Status: She is alert and oriented to person, place, and time.  Psychiatric:        Behavior: Behavior normal.      Musculoskeletal Exam: C-spine thoracic and lumbar spine some discomfort with range of motion.  She had bilateral trapezius tenderness on examination.  Shoulder joints, elbow joints, wrist joints, MCPs PIPs and DIPs with good range of motion with no synovitis.  Hip joints, knee joints, ankles MTPs PIPs with good range of motion with no synovitis.  She has positive tender points on examination  CDAI Exam: CDAI Score: - Patient Global: -; Provider Global: - Swollen: -; Tender: - Joint Exam   No joint exam has been documented for this visit   There  is currently no information documented on the homunculus. Go to the Rheumatology activity and complete the homunculus joint exam.  Investigation: No additional findings.  Imaging: No results found.  Recent Labs: Lab Results  Component Value Date   WBC 6.7 10/11/2017   HGB 12.2 10/11/2017   PLT 234 10/11/2017   NA 141 09/01/2017   K 3.9 09/01/2017   CL 107 09/01/2017   CO2 26 09/01/2017   GLUCOSE 120 (H) 09/01/2017   BUN 14 09/01/2017   CREATININE 0.79 09/01/2017   BILITOT 0.6 09/01/2017   ALKPHOS 64 09/01/2017   AST 28 09/01/2017   ALT 22 09/01/2017   PROT 7.5 09/01/2017   ALBUMIN 4.1 09/01/2017   CALCIUM 9.4 09/01/2017   GFRAA >60 09/01/2017    Speciality Comments: No specialty comments available.  Procedures:  Trigger Point Inj  Date/Time: 07/20/2018 11:27 AM Performed by: Bo Merino, MD Authorized by: Bo Merino, MD   Consent Given by:  Patient Site marked: the procedure site was marked   Timeout: prior to procedure the correct patient, procedure, and site was verified   Indications:  Muscle spasm and pain Total # of Trigger Points:  2 Location: neck   Needle Size:  27 G Approach:  Dorsal Medications #1:   0.5 mL lidocaine 1 %; 10 mg triamcinolone acetonide 40 MG/ML Medications #2:  0.5 mL lidocaine 1 %; 10 mg triamcinolone acetonide 40 MG/ML Patient tolerance:  Patient tolerated the procedure well with no immediate complications   Allergies: Bactrim [sulfamethoxazole-trimethoprim], Penicillins, and Tetracyclines & related   Assessment / Plan:     Visit Diagnoses: Fibromyalgia -she continues to have some generalized pain and discomfort.  According to patient she has been having a flare of fibromyalgia symptoms with increased generalized pain.  She has been taking tramadol on regular basis which helps her to manage her discomfort.  Medication monitoring encounter -she is currently on tramadol. - Plan: Pain Mgmt, Profile 5 w/Conf, U, Pain Mgmt, Tramadol w/medMATCH, U, was obtained today and the narcotic agreement was renewed.  Trapezius muscle spasm -she has been having discomfort in bilateral trapezius area.  Per her request bilateral trapezius area was injected with cortisone and lidocaine as described above.  She tolerated the procedure well.  ANA positive -  +RF, -CCP -patient has no synovitis on examination and no clinical features of autoimmune disease.  Primary osteoarthritis of both knees -weight loss diet and exercise was discussed.  DDD (degenerative disc disease), lumbar -she has chronic discomfort.  Hypermobility of joint -isometric exercises were discussed.  Age-related osteoporosis without current pathological fracture - Fosamax 70 mg po once weekly.  Managed by her PCP.  Other insomnia - Plan: Good sleep hygiene was discussed.   Other medical conditions are listed as follows:   History of COPD  History of IBS  History of hypercholesterolemia  History of Crohn's disease  History of cholecystectomy   Orders: Orders Placed This Encounter  Procedures  . Trigger Point Inj  . Pain Mgmt, Profile 5 w/Conf, U  . Pain Mgmt, Tramadol w/medMATCH, U   No orders of the  defined types were placed in this encounter.     Follow-Up Instructions: Return in about 6 months (around 01/20/2019) for FMS, OA.   Bo Merino, MD  Note - This record has been created using Editor, commissioning.  Chart creation errors have been sought, but may not always  have been located. Such creation errors do not reflect on  the standard of medical care.

## 2018-07-10 ENCOUNTER — Encounter (INDEPENDENT_AMBULATORY_CARE_PROVIDER_SITE_OTHER): Payer: Self-pay | Admitting: Internal Medicine

## 2018-07-10 ENCOUNTER — Ambulatory Visit (INDEPENDENT_AMBULATORY_CARE_PROVIDER_SITE_OTHER): Payer: Medicare Other | Admitting: Internal Medicine

## 2018-07-10 ENCOUNTER — Other Ambulatory Visit: Payer: Self-pay

## 2018-07-10 VITALS — BP 130/78 | HR 83 | Temp 98.1°F | Resp 18 | Ht 64.0 in | Wt 190.8 lb

## 2018-07-10 DIAGNOSIS — K5010 Crohn's disease of large intestine without complications: Secondary | ICD-10-CM

## 2018-07-10 MED ORDER — MESALAMINE ER 0.375 G PO CP24: 1500.0000 mg | ORAL_CAPSULE | Freq: Every day | ORAL | 5 refills | Status: DC

## 2018-07-10 NOTE — Progress Notes (Signed)
Presenting complaint;  Follow-up for Crohn's colitis.  Subjective:  Patient is 63 year old Caucasian female with history of Crohn's colitis dating back to January 2014 who is here for scheduled visit.  Following her last visit in October 2019 she underwent colonoscopy and December 2019.  She is presently on oral mesalamine.  Patient says she is doing well.  Since she has been on oral mesalamine she has had formed stools.  She is not having cramping or bleeding anymore.  Her appetite is good. She says she has had 3 episodes of diverticulitis and the last one was in August 2019. Patient has been on Asacol 800 mg twice daily.  She is received a letter from her insurance company stating that it is not a preferred medication and should be changed.  Current Medications: Outpatient Encounter Medications as of 07/10/2018  Medication Sig  . alendronate (FOSAMAX) 70 MG tablet Take 70 mg by mouth every Friday. Take with a full glass of water on an empty stomach.   Marland Kitchen BREO ELLIPTA 100-25 MCG/INH AEPB Inhale 1 puff into the lungs daily.   Marland Kitchen CALCIUM GLUCONATE PO Take 600 mg by mouth 2 (two) times daily.   . cetirizine (ZYRTEC) 10 MG tablet Take 10 mg by mouth daily.  . cholecalciferol (VITAMIN D) 1000 units tablet Take 1,000 Units by mouth 2 (two) times daily.   . DULoxetine (CYMBALTA) 30 MG capsule TAKE 1 CAPSULE BY MOUTH EVERY DAY  . gabapentin (NEURONTIN) 300 MG capsule Take 300 mg by mouth 3 (three) times daily.  Marland Kitchen gemfibrozil (LOPID) 600 MG tablet Take 600 mg by mouth 2 (two) times daily before a meal.  . ipratropium (ATROVENT) 0.06 % nasal spray Place 1 spray into both nostrils daily as needed for rhinitis.   Marland Kitchen levothyroxine (SYNTHROID, LEVOTHROID) 125 MCG tablet Take 125 mcg by mouth daily.  . Mesalamine (ASACOL) 400 MG CPDR DR capsule TAKE 2 CAPSULES (800 MG TOTAL) BY MOUTH 2 (TWO) TIMES DAILY.  Marland Kitchen PROAIR HFA 108 (90 Base) MCG/ACT inhaler Inhale 1-2 puffs into the lungs every 6 (six) hours as needed  for wheezing or shortness of breath.   . rosuvastatin (CRESTOR) 10 MG tablet Take 10 mg by mouth daily.  Marland Kitchen tiZANidine (ZANAFLEX) 4 MG tablet TAKE 1 TABLET BY MOUTH EVERYDAY AT BEDTIME  . traMADol (ULTRAM) 50 MG tablet Take 1 tablet (50 mg total) by mouth 3 (three) times daily as needed.   No facility-administered encounter medications on file as of 07/10/2018.      Objective: Blood pressure 130/78, pulse 83, temperature 98.1 F (36.7 C), temperature source Oral, resp. rate 18, height 5' 4"  (1.626 m), weight 190 lb 12.8 oz (86.5 kg). Patient is alert and in no acute distress. Conjunctiva is pink. Sclera is nonicteric Oropharyngeal mucosa is normal. No neck masses or thyromegaly noted. Cardiac exam with regular rhythm normal S1 and S2. No murmur or gallop noted. Lungs are clear to auscultation. Abdomen is full but soft and nontender with organomegaly or masses. No LE edema or clubbing noted.   Assessment:  #1.  Crohn's colitis.  She appears to be in clinical remission while on oral mesalamine.  Mesalamine preparation will be changed to preferred agent.  Hopefully she would not have any untoward effects   Plan:  Discontinue Asacol. Begin Apriso 1500 mg p.o. daily. Office visit in 6 months.

## 2018-07-10 NOTE — Patient Instructions (Signed)
Please call us with progress report in 1 month. Unless you experience side effects with Apriso we will write a new prescription for 3 months each time.

## 2018-07-16 ENCOUNTER — Other Ambulatory Visit: Payer: Self-pay | Admitting: Rheumatology

## 2018-07-16 NOTE — Telephone Encounter (Signed)
Patient called requesting prescription refill of Tramadol to be [facility] to [facility] [address] in [place], [place].

## 2018-07-16 NOTE — Telephone Encounter (Signed)
Last visit: 01/19/2018 Next visit: 07/20/2018 UDS: 01/19/2018 Narc Agreement: 01/19/18  Okay to refill Tramadol?

## 2018-07-17 MED ORDER — TRAMADOL HCL 50 MG PO TABS: 50.0000 mg | ORAL_TABLET | Freq: Three times a day (TID) | ORAL | 0 refills | Status: DC | PRN

## 2018-07-17 NOTE — Telephone Encounter (Signed)
ok 

## 2018-07-20 ENCOUNTER — Ambulatory Visit: Payer: Medicare Other | Admitting: Rheumatology

## 2018-07-20 ENCOUNTER — Other Ambulatory Visit: Payer: Self-pay

## 2018-07-20 ENCOUNTER — Encounter: Payer: Self-pay | Admitting: Rheumatology

## 2018-07-20 VITALS — BP 134/74 | HR 81 | Resp 16 | Ht 63.0 in | Wt 190.4 lb

## 2018-07-20 DIAGNOSIS — R768 Other specified abnormal immunological findings in serum: Secondary | ICD-10-CM | POA: Diagnosis not present

## 2018-07-20 DIAGNOSIS — M810 Age-related osteoporosis without current pathological fracture: Secondary | ICD-10-CM

## 2018-07-20 DIAGNOSIS — M5136 Other intervertebral disc degeneration, lumbar region: Secondary | ICD-10-CM

## 2018-07-20 DIAGNOSIS — M797 Fibromyalgia: Secondary | ICD-10-CM | POA: Diagnosis not present

## 2018-07-20 DIAGNOSIS — M249 Joint derangement, unspecified: Secondary | ICD-10-CM

## 2018-07-20 DIAGNOSIS — Z5181 Encounter for therapeutic drug level monitoring: Secondary | ICD-10-CM

## 2018-07-20 DIAGNOSIS — M170 Bilateral primary osteoarthritis of knee: Secondary | ICD-10-CM

## 2018-07-20 DIAGNOSIS — M62838 Other muscle spasm: Secondary | ICD-10-CM

## 2018-07-20 DIAGNOSIS — G4709 Other insomnia: Secondary | ICD-10-CM

## 2018-07-20 MED ORDER — TRIAMCINOLONE ACETONIDE 40 MG/ML IJ SUSP
10.0000 mg | INTRAMUSCULAR | Status: AC | PRN
  Administered 2018-07-20: 10 mg via INTRAMUSCULAR

## 2018-07-20 MED ORDER — LIDOCAINE HCL 1 % IJ SOLN
0.5000 mL | INTRAMUSCULAR | Status: AC | PRN
  Administered 2018-07-20: .5 mL

## 2018-07-22 LAB — PAIN MGMT, PROFILE 5 W/CONF, U
Amphetamines: NEGATIVE ng/mL
Barbiturates: NEGATIVE ng/mL
Benzodiazepines: NEGATIVE ng/mL
Cocaine Metabolite: NEGATIVE ng/mL
Codeine: NEGATIVE ng/mL
Creatinine: 67.6 mg/dL
Hydrocodone: NEGATIVE ng/mL
Hydromorphone: NEGATIVE ng/mL
Marijuana Metabolite: NEGATIVE ng/mL
Methadone Metabolite: NEGATIVE ng/mL
Morphine: NEGATIVE ng/mL
Norhydrocodone: NEGATIVE ng/mL
Opiates: NEGATIVE ng/mL
Oxidant: NEGATIVE ug/mL
Oxycodone: NEGATIVE ng/mL
pH: 5.5 (ref 4.5–9.0)

## 2018-07-22 LAB — PAIN MGMT, TRAMADOL W/MEDMATCH, U
Desmethyltramadol: 2926 ng/mL
Tramadol: >10000 ng/mL

## 2018-07-23 NOTE — Progress Notes (Signed)
Consistent with the treatment.

## 2018-08-05 ENCOUNTER — Other Ambulatory Visit: Payer: Self-pay | Admitting: Rheumatology

## 2018-08-06 NOTE — Telephone Encounter (Signed)
Last Visit: 07/20/2018 Next Visit: 01/18/2019  Okay to refill per Dr. Estanislado Pandy.

## 2018-08-17 ENCOUNTER — Telehealth: Payer: Self-pay | Admitting: Rheumatology

## 2018-08-17 MED ORDER — TRAMADOL HCL 50 MG PO TABS: 50.0000 mg | ORAL_TABLET | Freq: Three times a day (TID) | ORAL | 0 refills | Status: DC | PRN

## 2018-08-17 NOTE — Telephone Encounter (Signed)
Last Visit: 07/20/2018 Next Visit: 01/18/2019 UDS: 07/20/18 Narc Agreement: 07/20/18  Last Fill: 07/17/18  Okay to refill Tramadol?

## 2018-08-17 NOTE — Telephone Encounter (Signed)
Patient called requesting prescription refill of Tramadol to [facility] in [place].

## 2018-08-26 ENCOUNTER — Other Ambulatory Visit: Payer: Self-pay | Admitting: Rheumatology

## 2018-08-27 NOTE — Telephone Encounter (Signed)
Last Visit: 07/20/2018 Next Visit: 01/18/2019  Okay to refill per Dr. Estanislado Pandy.

## 2018-09-17 ENCOUNTER — Telehealth: Payer: Self-pay | Admitting: Rheumatology

## 2018-09-17 NOTE — Telephone Encounter (Signed)
Last Visit:07/20/2018 Next Visit:01/18/2019 UDS: 07/20/18 Narc Agreement: 07/20/18  Last Fill: 08/17/18  Okay to refill Tramadol?

## 2018-09-17 NOTE — Telephone Encounter (Signed)
Patient called requesting prescription refill of Tramadol to [facility] [address] in [place].

## 2018-09-18 ENCOUNTER — Telehealth: Payer: Self-pay | Admitting: Rheumatology

## 2018-09-18 MED ORDER — TRAMADOL HCL 50 MG PO TABS: 50.0000 mg | ORAL_TABLET | Freq: Three times a day (TID) | ORAL | 0 refills | Status: DC | PRN

## 2018-09-18 NOTE — Telephone Encounter (Signed)
Patient called stating she just got off [facility] who told her they haven't received a prescription refill for her Tramadol.  Patient is requesting a return call.

## 2018-09-18 NOTE — Telephone Encounter (Signed)
Called patient and advised patient that prescription was faxed about an hour ago and I have re-faxed it again. Patient verbalized understanding and will contact the pharmacy.

## 2018-10-19 ENCOUNTER — Telehealth: Payer: Self-pay | Admitting: Rheumatology

## 2018-10-19 ENCOUNTER — Other Ambulatory Visit: Payer: Self-pay | Admitting: Rheumatology

## 2018-10-19 MED ORDER — TRAMADOL HCL 50 MG PO TABS: 50.0000 mg | ORAL_TABLET | Freq: Three times a day (TID) | ORAL | 0 refills | Status: DC | PRN

## 2018-10-19 NOTE — Telephone Encounter (Signed)
Patient called requesting prescription refill of Tramadol to [facility] [place] in [place].

## 2018-10-19 NOTE — Telephone Encounter (Signed)
Last Visit:07/20/2018 Next Visit:01/18/2019 UDS: 07/20/18 Narc Agreement: 07/20/18  Last Fill: 09/18/18  Okay to refill Tramadol?

## 2018-11-04 ENCOUNTER — Other Ambulatory Visit: Payer: Self-pay | Admitting: Rheumatology

## 2018-11-05 ENCOUNTER — Other Ambulatory Visit (INDEPENDENT_AMBULATORY_CARE_PROVIDER_SITE_OTHER): Payer: Self-pay | Admitting: Internal Medicine

## 2018-11-05 NOTE — Telephone Encounter (Signed)
Last Visit:07/20/2018 Next Visit:01/18/2019  Okay to refillper Dr. Estanislado Pandy.

## 2018-11-20 ENCOUNTER — Telehealth: Payer: Self-pay | Admitting: Rheumatology

## 2018-11-20 MED ORDER — TRAMADOL HCL 50 MG PO TABS: 50.0000 mg | ORAL_TABLET | Freq: Three times a day (TID) | ORAL | 0 refills | Status: DC | PRN

## 2018-11-20 NOTE — Telephone Encounter (Signed)
Last Visit:07/20/2018 Next Visit:01/18/2019 UDS: 07/20/18 Narc Agreement: 07/20/18  Last Fill:10/19/18  Okay to refill Tramadol?

## 2018-11-20 NOTE — Telephone Encounter (Signed)
Patient request [facility] [place]

## 2018-12-03 ENCOUNTER — Telehealth: Payer: Self-pay | Admitting: Rheumatology

## 2018-12-03 NOTE — Telephone Encounter (Signed)
Patient called requesting prescription refill of Tizanidine to [facility] [place] in [place].  Patient was told that prescription would be sent on Wednesday, 12/05/18.

## 2018-12-03 NOTE — Telephone Encounter (Signed)
Ok to refill 

## 2018-12-03 NOTE — Telephone Encounter (Signed)
Notes recorded by Bo Merino, MD on 07/23/2018 at 8:19 AM EDT  Consistent with the treatment.  Last RF 08/27/2018 Last appt 07/20/2018 Next appt 01/18/2019

## 2018-12-04 MED ORDER — TIZANIDINE HCL 4 MG PO TABS: ORAL_TABLET | ORAL | 0 refills | Status: DC

## 2018-12-04 NOTE — Telephone Encounter (Signed)
Please contact patient. Thank you.

## 2018-12-04 NOTE — Telephone Encounter (Signed)
Prescription has been sent.

## 2018-12-20 ENCOUNTER — Telehealth: Payer: Self-pay | Admitting: Rheumatology

## 2018-12-20 MED ORDER — TRAMADOL HCL 50 MG PO TABS: 50.0000 mg | ORAL_TABLET | Freq: Three times a day (TID) | ORAL | 0 refills | Status: DC | PRN

## 2018-12-20 NOTE — Telephone Encounter (Signed)
Last Visit: 07/20/2018 Next Visit: 01/18/2019 UDS:07/20/2018 c/w Narc Agreement: 07/20/2018   Last fill: 11/20/2018   Okay to refill tramadol?

## 2018-12-20 NOTE — Telephone Encounter (Signed)
Patient called requesting prescription refill of Tramadol [facility] [place] [place] in [place].

## 2019-01-15 ENCOUNTER — Ambulatory Visit (INDEPENDENT_AMBULATORY_CARE_PROVIDER_SITE_OTHER): Payer: Medicare Other | Admitting: Internal Medicine

## 2019-01-18 ENCOUNTER — Ambulatory Visit: Payer: Medicare Other | Admitting: Rheumatology

## 2019-01-21 ENCOUNTER — Telehealth: Payer: Self-pay | Admitting: Rheumatology

## 2019-01-21 NOTE — Telephone Encounter (Signed)
Patient called requesting prescription refill of Tramadol to [facility] [place] in [place].

## 2019-01-21 NOTE — Telephone Encounter (Signed)
Last Visit:07/20/2018 Next Visit: 03/04/2019 UDS: 07/20/18 Narc Agreement: 07/20/18  Last Fill: 12/20/18  Patient advised she is due to update UDS and narc agreement. Patient state she will come 01/24/19  Okay to refill Tramadol?

## 2019-01-22 MED ORDER — TRAMADOL HCL 50 MG PO TABS: 50.0000 mg | ORAL_TABLET | Freq: Three times a day (TID) | ORAL | 0 refills | Status: DC | PRN

## 2019-01-24 ENCOUNTER — Other Ambulatory Visit: Payer: Self-pay

## 2019-01-24 DIAGNOSIS — Z5181 Encounter for therapeutic drug level monitoring: Secondary | ICD-10-CM | POA: Diagnosis not present

## 2019-01-28 ENCOUNTER — Other Ambulatory Visit: Payer: Self-pay | Admitting: Rheumatology

## 2019-01-28 ENCOUNTER — Telehealth: Payer: Self-pay | Admitting: *Deleted

## 2019-01-28 NOTE — Telephone Encounter (Signed)
Last Visit:07/20/2018 Next Visit: 03/04/2019  Okay to refill per Dr. Estanislado Pandy

## 2019-01-28 NOTE — Telephone Encounter (Signed)
-----   Message from Shona Needles, [place] sent at 01/25/2019  3:26 PM EST ----- Regarding: FW: Narcotic Agreement signed 01/24/2019  ----- Message ----- From: Shona Needles, RT Sent: 01/25/2019   3:24 PM EST To: [facility] Subject: Narcotic Agreement signed 01/24/2019

## 2019-02-02 LAB — PAIN MGMT, TRAMADOL W/MEDMATCH, U
Desmethyltramadol: 524 ng/mL
Tramadol: 3092 ng/mL

## 2019-02-02 LAB — PAIN MGMT, PROFILE 5 W/CONF, U
Amphetamines: NEGATIVE ng/mL
Barbiturates: NEGATIVE ng/mL
Benzodiazepines: NEGATIVE ng/mL
Cocaine Metabolite: NEGATIVE ng/mL
Creatinine: 29 mg/dL
Marijuana Metabolite: NEGATIVE ng/mL
Methadone Metabolite: NEGATIVE ng/mL
Opiates: NEGATIVE ng/mL
Oxidant: NEGATIVE ug/mL
Oxycodone: NEGATIVE ng/mL
pH: 7.1 (ref 4.5–9.0)

## 2019-02-25 ENCOUNTER — Telehealth: Payer: Self-pay | Admitting: Rheumatology

## 2019-02-25 ENCOUNTER — Other Ambulatory Visit: Payer: Self-pay | Admitting: Physician Assistant

## 2019-02-25 MED ORDER — TRAMADOL HCL 50 MG PO TABS: 50.0000 mg | ORAL_TABLET | Freq: Three times a day (TID) | ORAL | 0 refills | Status: DC | PRN

## 2019-02-25 NOTE — Telephone Encounter (Signed)
Patient called requesting prescription refill of Tramad[facility] [place].

## 2019-02-25 NOTE — Telephone Encounter (Signed)
Last Visit:07/20/2018 Next Visit:03/04/2019  Okay to refill per Dr. Estanislado Pandy

## 2019-02-25 NOTE — Telephone Encounter (Signed)
Last Visit:07/20/2018 Next Visit: 03/04/2019 UDS: 01/24/19 Narc Agreement: 01/24/19  Last Fill: 01/22/19  Okay to refill Tramadol?

## 2019-02-26 NOTE — Progress Notes (Signed)
Office Visit Note  Patient: Desiree Hogan             Date of Birth: 1955/12/14           MRN: [id number]             PCP: Glenda Chroman, MD Referring: Glenda Chroman, MD Visit Date: 03/04/2019 Occupation: @GUAROCC @  Subjective:  Pain in Multiple Joints  History of Present Illness: Desiree Hogan is a 64 y.o. female with a past medical history of fibromyalgia and osteoarthritis. Since her last visit, she endorses worsening pain in her bilateral shoulders, elbows, wrists, and hands. She states that these joints often experience accompanying stiffness as well. She also experiences pain in her bilateral hips as well as her cervical spine and SI joints. She denies any swelling of her joints. Nothing seems to aggravate or alleviate the pain. She states that the pain will wax and wane intensity and she currently rates her pain a 7 out of 10, with 10 being the worst pain imaginable. She continues to take Tramadol 1 tablet three times a day to help control her pain, but states that this is not helping. She is currently on Tizanidine, Cymbalta, and Gabapentin. She continues to have difficulty sleeping.  Activities of Daily Living:  Patient reports morning stiffness for 30-45 minutes.   Patient Reports nocturnal pain.  Difficulty dressing/grooming: Denies Difficulty climbing stairs: Reports Difficulty getting out of chair: Reports Difficulty using hands for taps, buttons, cutlery, and/or writing: Reports  Review of Systems  Constitutional: Positive for fatigue. Negative for night sweats, weight gain and weight loss.  HENT: Negative for mouth sores, trouble swallowing, trouble swallowing, mouth dryness and nose dryness.   Eyes: Negative for pain, redness, itching, visual disturbance and dryness.  Respiratory: Negative for cough, shortness of breath and difficulty breathing.   Cardiovascular: Negative for chest pain, palpitations, hypertension, irregular heartbeat and swelling in legs/feet.    Gastrointestinal: Negative for blood in stool, constipation and diarrhea.  Endocrine: Negative for increased urination.  Genitourinary: Negative for difficulty urinating, painful urination and vaginal dryness.  Musculoskeletal: Positive for arthralgias, joint pain, muscle weakness, morning stiffness and muscle tenderness. Negative for joint swelling, myalgias and myalgias.  Skin: Negative for color change, rash, hair loss, skin tightness, ulcers and sensitivity to sunlight.  Allergic/Immunologic: Negative for susceptible to infections.  Neurological: Negative for dizziness, numbness, headaches, memory loss, night sweats and weakness.  Hematological: Negative for bruising/bleeding tendency and swollen glands.  Psychiatric/Behavioral: Positive for sleep disturbance. Negative for depressed mood and confusion. The patient is not nervous/anxious.     PMFS History:  Patient Active Problem List   Diagnosis Date Noted  . Diverticulitis of colon 10/11/2017  . Crohn's colitis ([place]) 10/11/2017  . Fibromyalgia 07/14/2017  . ANA positive 07/14/2017  . Primary osteoarthritis of both knees 07/14/2017  . DDD (degenerative disc disease), lumbar 07/14/2017  . Hypermobility of joint 07/14/2017  . Age-related osteoporosis without current pathological fracture 07/14/2017  . Other insomnia 07/14/2017  . History of Crohn's disease 07/14/2017  . History of IBS 07/14/2017  . History of hypercholesterolemia 07/14/2017  . History of COPD 07/14/2017  . History of cholecystectomy 07/14/2017  . Community acquired pneumonia 12/13/2016  . Pneumonia 12/13/2016  . Leukocytosis 12/13/2016  . Hypokalemia 12/13/2016  . Diabetes mellitus without complication ([place]) 70/26/3785    Past Medical History:  Diagnosis Date  . Abdominal pain   . Anemia   . Back pain   . Colitis   .  COPD (chronic obstructive pulmonary disease) (HCC)    not on home o2  . Crohn's disease ([place]) 03/29/12  . Diabetes mellitus without  complication ([place])   . Fibromyalgia   . H/O breast biopsy   . Hip pain   . Hyperlipidemia   . Hypothyroidism   . Irritable bowel syndrome   . Joint pain   . Nonspecific abnormal finding in stool contents   . Peripheral vascular disease ([place])   . Ulcer    Mouth    Family History  Problem Relation Age of Onset  . Cancer Mother        Colon or vaginal ?  . Deep vein thrombosis Mother   . Hypertension Father   . Diabetes Father   . Heart disease Father        Heart Disease before age 46  . Diabetes Sister   . Diabetes Brother   . Hypertension Brother   . Healthy Son   . Healthy Daughter   . Healthy Daughter   . Healthy Daughter    Past Surgical History:  Procedure Laterality Date  . BIOPSY  12/20/2017   Procedure: BIOPSY;  Surgeon: Rogene Houston, MD;  [facility];  Service: Endoscopy;;  cecal erosions  . CESAREAN SECTION  06/24/82  . CHOLECYSTECTOMY  1993   Gall Bladder  . COLONOSCOPY  Jan. 31, 2014  . COLONOSCOPY N/A 12/20/2017   Procedure: COLONOSCOPY;  Surgeon: Rogene Houston, MD;  Location[facility];  Service: Endoscopy;  Laterality: N/A;  2:25  . [place] SURGERY  2010 and 2011  . TONSILLECTOMY     Social History   Social History Narrative  . Not on file    There is no immunization history on file for this patient.   Objective: Vital Signs: BP 129/78 (BP Location: Left Arm, Patient Position: Sitting, Cuff Size: Normal)   Pulse 81   Resp 14   Ht 5' 3"  (1.6 m)   Wt 190 lb (86.2 kg)   BMI 33.66 kg/m    Physical Exam Constitutional:      General: She is not in acute distress.    Appearance: Normal appearance.  HENT:     Head: Normocephalic and atraumatic.  Eyes:     Conjunctiva/sclera: Conjunctivae normal.  Cardiovascular:     Rate and Rhythm: Normal rate.  Pulmonary:     Effort: Pulmonary effort is normal.  Abdominal:     Palpations: Abdomen is soft.  Musculoskeletal:        General: Tenderness present. No swelling.      Cervical back: Tenderness present.     Right lower leg: No edema.     Left lower leg: No edema.  Skin:    General: Skin is warm and dry.  Neurological:     Mental Status: She is oriented to person, place, and time.  Psychiatric:        Behavior: Behavior normal.      Musculoskeletal Exam: Limited ROM of cervical, thoracic, lumbar spine with accompanying discomfort and tenderness. Bilateral SI joint tenderness present. Limited ROM of bilateral shoulders with discomfort. Bilateral trapezius tenderness present upon examination. Elbow joints with good ROM. Bilateral elbows were tender to palpation. Wrist joints with limited ROM. MCPs, PIPs, and DIPs with good ROM and no synovitis. Hip joints with limited ROM. No tenderness to palpation of bilateral trochanteric bursa. Knees joints, ankle joints, MTPs, and PIPs with good ROM. No warmth or effusion present of bilateral knee joints. Cyst  noted on right ankle.   CDAI Exam: CDAI Score: -- Patient Global: --; Provider Global: -- Swollen: --; Tender: -- Joint Exam 03/04/2019   No joint exam has been documented for this visit   There is currently no information documented on the homunculus. Go to the Rheumatology activity and complete the homunculus joint exam.  Investigation: No additional findings.  Imaging: No results found.  Recent Labs: Lab Results  Component Value Date   WBC 6.7 10/11/2017   HGB 12.2 10/11/2017   PLT 234 10/11/2017   NA 141 09/01/2017   K 3.9 09/01/2017   CL 107 09/01/2017   CO2 26 09/01/2017   GLUCOSE 120 (H) 09/01/2017   BUN 14 09/01/2017   CREATININE 0.79 09/01/2017   BILITOT 0.6 09/01/2017   ALKPHOS 64 09/01/2017   AST 28 09/01/2017   ALT 22 09/01/2017   PROT 7.5 09/01/2017   ALBUMIN 4.1 09/01/2017   CALCIUM 9.4 09/01/2017   GFRAA >60 09/01/2017    Speciality Comments: No specialty comments available.  Procedures:  Trigger Point Inj  Date/Time: 03/04/2019 11:59 AM Performed by: Bo Merino, MD Authorized by: Bo Merino, MD   Consent Given by:  Patient Site marked: the procedure site was marked   Timeout: prior to procedure the correct patient, procedure, and site was verified   Indications:  Muscle spasm, pain and therapeutic Total # of Trigger Points:  2 Location: neck   Needle Size:  27 G Approach:  Dorsal Medications #1:  0.5 mL lidocaine 1 %; 10 mg triamcinolone acetonide 40 MG/ML Medications #2:  0.5 mL lidocaine 1 %; 10 mg triamcinolone acetonide 40 MG/ML Patient tolerance:  Patient tolerated the procedure well with no immediate complications   Allergies: Bactrim [sulfamethoxazole-trimethoprim], Penicillins, and Tetracyclines & related   Assessment / Plan:     Visit Diagnoses: Fibromyalgia - Tizanidine, Cymbalta, Tramadol, Gabapentin. She is experiencing worsening joint pain of bilateral shoulders, elbows, wrists, and hands. She also endorses cervical spine pain and SI joint tenderness. She denies any joint swelling at this time. She continues to take Tramadol 1 tablet three times a day with little pain relief. We will refer to pain management for better pain control.  Medication monitoring encounter - Tramadol. UDS & narc agreement: 01/24/2019  Trapezius muscle spasm - She continues to experience bilateral trapezius muscle tenderness. Per patient's request bilateral trapezius injections were performed with cortisone and lidocaine. She tolerated the procedure well.   ANA positive - +RF, -CCP - No synovitis noted on examination.  Primary osteoarthritis of both knees - She has chronic bilateral knee pain.  DDD (degenerative disc disease), lumbar - Patient has persistent lumbar back pain, especially over SI joints.  She will benefit from pain management.  Hypermobility of joint  Age-related osteoporosis without current pathological fracture - Fosamax 70 mg po once weekly.  Managed by her PCP.  Other insomnia - She continues to experince difficulty  sleeping.    Other medical conditions are listed as follows:  History of COPD  History of hypercholesterolemia  History of IBS  History of Crohn's disease  History of cholecystectomy  Orders: Orders Placed This Encounter  Procedures  . Trigger Point Inj  . Ambulatory referral to Pain Clinic   No orders of the defined types were placed in this encounter.   Face-to-face time spent with patient was 30 minutes. Greater than 50% of time was spent in counseling and coordination of care.  Follow-Up Instructions: Return in about 6 months (around 09/04/2019) for  FMS, OA.   Bo Merino, MD  Note - This record has been created using Editor, commissioning.  Chart creation errors have been sought, but may not always  have been located. Such creation errors do not reflect on  the standard of medical care.

## 2019-03-04 ENCOUNTER — Other Ambulatory Visit: Payer: Self-pay

## 2019-03-04 ENCOUNTER — Ambulatory Visit: Payer: Medicare Other | Admitting: Rheumatology

## 2019-03-04 ENCOUNTER — Encounter: Payer: Self-pay | Admitting: Rheumatology

## 2019-03-04 VITALS — BP 129/78 | HR 81 | Resp 14 | Ht 63.0 in | Wt 190.0 lb

## 2019-03-04 DIAGNOSIS — M170 Bilateral primary osteoarthritis of knee: Secondary | ICD-10-CM

## 2019-03-04 DIAGNOSIS — M62838 Other muscle spasm: Secondary | ICD-10-CM

## 2019-03-04 DIAGNOSIS — M810 Age-related osteoporosis without current pathological fracture: Secondary | ICD-10-CM

## 2019-03-04 DIAGNOSIS — Z5181 Encounter for therapeutic drug level monitoring: Secondary | ICD-10-CM | POA: Diagnosis not present

## 2019-03-04 DIAGNOSIS — R768 Other specified abnormal immunological findings in serum: Secondary | ICD-10-CM | POA: Diagnosis not present

## 2019-03-04 DIAGNOSIS — M249 Joint derangement, unspecified: Secondary | ICD-10-CM

## 2019-03-04 DIAGNOSIS — M51369 Other intervertebral disc degeneration, lumbar region without mention of lumbar back pain or lower extremity pain: Secondary | ICD-10-CM

## 2019-03-04 DIAGNOSIS — Z9049 Acquired absence of other specified parts of digestive tract: Secondary | ICD-10-CM

## 2019-03-04 DIAGNOSIS — G4709 Other insomnia: Secondary | ICD-10-CM

## 2019-03-04 DIAGNOSIS — M797 Fibromyalgia: Secondary | ICD-10-CM

## 2019-03-04 DIAGNOSIS — M5136 Other intervertebral disc degeneration, lumbar region: Secondary | ICD-10-CM

## 2019-03-04 DIAGNOSIS — Z8639 Personal history of other endocrine, nutritional and metabolic disease: Secondary | ICD-10-CM

## 2019-03-04 DIAGNOSIS — Z8719 Personal history of other diseases of the digestive system: Secondary | ICD-10-CM

## 2019-03-04 DIAGNOSIS — Z8709 Personal history of other diseases of the respiratory system: Secondary | ICD-10-CM

## 2019-03-04 MED ORDER — TRIAMCINOLONE ACETONIDE 40 MG/ML IJ SUSP
10.0000 mg | INTRAMUSCULAR | Status: AC | PRN
  Administered 2019-03-04: 10 mg via INTRAMUSCULAR

## 2019-03-04 MED ORDER — LIDOCAINE HCL 1 % IJ SOLN
0.5000 mL | INTRAMUSCULAR | Status: AC | PRN
  Administered 2019-03-04: .5 mL

## 2019-03-06 DIAGNOSIS — E1165 Type 2 diabetes mellitus with hyperglycemia: Secondary | ICD-10-CM | POA: Diagnosis not present

## 2019-03-06 DIAGNOSIS — Z87891 Personal history of nicotine dependence: Secondary | ICD-10-CM | POA: Diagnosis not present

## 2019-03-06 DIAGNOSIS — K5090 Crohn's disease, unspecified, without complications: Secondary | ICD-10-CM | POA: Diagnosis not present

## 2019-03-06 DIAGNOSIS — E039 Hypothyroidism, unspecified: Secondary | ICD-10-CM | POA: Diagnosis not present

## 2019-03-06 DIAGNOSIS — Z299 Encounter for prophylactic measures, unspecified: Secondary | ICD-10-CM | POA: Diagnosis not present

## 2019-03-07 ENCOUNTER — Telehealth: Payer: Self-pay | Admitting: *Deleted

## 2019-03-07 ENCOUNTER — Encounter: Payer: Self-pay | Admitting: Physical Medicine & Rehabilitation

## 2019-03-07 NOTE — Telephone Encounter (Signed)
Received a Drug Interaction notification from patient's insurance. Patient in on Cymbalta and Tramadol. Reviewed by Hazel Sams, PA-C Recommendations are to discuss with patient to reduce Tramadol dose to reduce the risk of serotonin syndrome.  Attempted to contact the patient and left message for patient to call the office.

## 2019-03-07 NOTE — Telephone Encounter (Signed)
Patient returned call to the office. Patient advised we received a Drug Interaction notification from her insurance. Confirmed she is  Cymbalta and Tramadol. Advised it was reviewed by Hazel Sams, PA-C and the recommendations are to reduce Tramadol dose to reduce the risk of serotonin syndrome. Patient verbalized understanding.

## 2019-03-12 DIAGNOSIS — E1140 Type 2 diabetes mellitus with diabetic neuropathy, unspecified: Secondary | ICD-10-CM | POA: Diagnosis not present

## 2019-03-12 DIAGNOSIS — E1159 Type 2 diabetes mellitus with other circulatory complications: Secondary | ICD-10-CM | POA: Diagnosis not present

## 2019-03-12 DIAGNOSIS — M79605 Pain in left leg: Secondary | ICD-10-CM | POA: Diagnosis not present

## 2019-03-26 ENCOUNTER — Other Ambulatory Visit: Payer: Self-pay

## 2019-03-26 ENCOUNTER — Encounter: Payer: Medicare Other | Attending: Physical Medicine & Rehabilitation | Admitting: Physical Medicine & Rehabilitation

## 2019-03-26 ENCOUNTER — Encounter: Payer: Self-pay | Admitting: Physical Medicine & Rehabilitation

## 2019-03-26 VITALS — BP 155/90 | HR 88 | Temp 97.7°F | Ht 63.0 in | Wt 190.0 lb

## 2019-03-26 DIAGNOSIS — M533 Sacrococcygeal disorders, not elsewhere classified: Secondary | ICD-10-CM | POA: Diagnosis not present

## 2019-03-26 DIAGNOSIS — M797 Fibromyalgia: Secondary | ICD-10-CM

## 2019-03-26 MED ORDER — TRAMADOL HCL 50 MG PO TABS: 50.0000 mg | ORAL_TABLET | Freq: Three times a day (TID) | ORAL | 5 refills | Status: DC | PRN

## 2019-03-26 MED ORDER — PREGABALIN 25 MG PO CAPS: 25.0000 mg | ORAL_CAPSULE | Freq: Two times a day (BID) | ORAL | 1 refills | Status: DC

## 2019-03-26 NOTE — Progress Notes (Signed)
Subjective:    Patient ID: Desiree Hogan, female    DOB: 03-Dec-1955, 64 y.o.   MRN: [id number]  HPI Chief complaint low back and buttock pain 64 year old female referred for the evaluation of chronic pain complaints by her rheumatologist.  The patient complains of pain in multiple areas including low back, buttocks, bilateral knees, bilateral hands, and her neck.  She actually points to the trapezius area instead of the neck itself. She has a history of lumbar fusion L3-4 L4-5 as well as a history of left lumbar radiculopathy.  The patient had her last spine surgery in 2011 by Dr. Sherwood Gambler.  There was a removal of L3-L4 interbody implant as well as left L3 pedicle screw. The patient's low back pain is bilateral.  It is more below the waist then above the waist. She has no pain radiating down the legs.  She does have bilateral knee pain but this does not feel like it is connected. She has been on tramadol 50 mg initially 3 times daily but back down to twice daily with concerns of serotonin syndrome because she was on Cymbalta.  Her Cymbalta dose is 30 mg/day She has been tried on gabapentin in the past but was told that it may give her a problem due to her Crohn's disease.  She could not elaborate on this.  She did state her gastroenterologist told her this. The patient is independent with all her self-care and mobility. Pain Inventory Average Pain 7 Pain Right Now 7 My pain is burning, stabbing, tingling and aching  In the last 24 hours, has pain interfered with the following? General activity 5 Relation with others 0 Enjoyment of life 6 What TIME of day is your pain at its worst? evening Sleep (in general) Poor  Pain is worse with: walking and standing Pain improves with: injections Relief from Meds: 5  Mobility walk without assistance how many minutes can you walk? 10 ability to climb steps?  yes do you drive?  yes  Function disabled: date disabled  .  Neuro/Psych tingling dizziness anxiety  Prior Studies new  Physicians involved in your care new   Family History  Problem Relation Age of Onset  . Cancer Mother        Colon or vaginal ?  . Deep vein thrombosis Mother   . Hypertension Father   . Diabetes Father   . Heart disease Father        Heart Disease before age 64  . Diabetes Sister   . Diabetes Brother   . Hypertension Brother   . Healthy Son   . Healthy Daughter   . Healthy Daughter   . Healthy Daughter    Social History   Socioeconomic History  . Marital status: Married    Spouse name: Not on file  . Number of children: Not on file  . Years of education: Not on file  . Highest education level: Not on file  Occupational History  . Not on file  Tobacco Use  . Smoking status: Former Smoker    Packs/day: 0.75    Years: 20.00    Pack years: 15.00    Types: Cigarettes    Quit date: 01/03/2009    Years since quitting: 10.2  . Smokeless tobacco: Never Used  Substance and Sexual Activity  . Alcohol use: No  . Drug use: No  . Sexual activity: Not on file  Other Topics Concern  . Not on file  Social History Narrative  .  Not on file   Social Determinants of Health   Financial Resource Strain:   . Difficulty of Paying Living Expenses:   Food Insecurity:   . Worried About Charity fundraiser in the Last Year:   . Arboriculturist in the Last Year:   Transportation Needs:   . Film/video editor (Medical):   Marland Kitchen Lack of Transportation (Non-Medical):   Physical Activity:   . Days of Exercise per Week:   . Minutes of Exercise per Session:   Stress:   . Feeling of Stress :   Social Connections:   . Frequency of Communication with Friends and Family:   . Frequency of Social Gatherings with Friends and Family:   . Attends Religious Services:   . Active Member of Clubs or Organizations:   . Attends Archivist Meetings:   Marland Kitchen Marital Status:    Past Surgical History:  Procedure Laterality  Date  . BIOPSY  12/20/2017   Procedure: BIOPSY;  Surgeon: Rogene Houston, MD;  [facility];  Service: Endoscopy;;  cecal erosions  . CESAREAN SECTION  06/24/82  . CHOLECYSTECTOMY  1993   Gall Bladder  . COLONOSCOPY  Jan. 31, 2014  . COLONOSCOPY N/A 12/20/2017   Procedure: COLONOSCOPY;  Surgeon: Rogene Houston, MD;  [facility];  Service: Endoscopy;  Laterality: N/A;  2:25  . [place] SURGERY  2010 and 2011  . TONSILLECTOMY     Past Medical History:  Diagnosis Date  . Abdominal pain   . Anemia   . Back pain   . Colitis   . COPD (chronic obstructive pulmonary disease) (HCC)    not on home o2  . Crohn's disease ([place]) 03/29/12  . Diabetes mellitus without complication ([place])   . Fibromyalgia   . H/O breast biopsy   . Hip pain   . Hyperlipidemia   . Hypothyroidism   . Irritable bowel syndrome   . Joint pain   . Nonspecific abnormal finding in stool contents   . Peripheral vascular disease ([place])   . Ulcer    Mouth   Temp 97.7 F (36.5 C)   Ht 5' 3"  (1.6 m)   Wt 190 lb (86.2 kg)   BMI 33.66 kg/m   Opioid Risk Score:   Fall Risk Score:  `1  Depression screen PHQ 2/9  Depression screen PHQ 2/9 03/26/2019  Decreased Interest 2  Down, Depressed, Hopeless 1  PHQ - 2 Score 3  Altered sleeping 2  Tired, decreased energy 2  Change in appetite 0  Feeling bad or failure about yourself  0  Trouble concentrating 2  Moving slowly or fidgety/restless 1  Suicidal thoughts 0  PHQ-9 Score 10    Review of Systems  Constitutional: Positive for diaphoresis and unexpected weight change.  HENT: Negative.   Eyes: Negative.   Respiratory: Positive for cough and shortness of breath.   Gastrointestinal: Positive for abdominal pain.  Endocrine:       High blood sugar  Genitourinary: Negative.   Musculoskeletal: Positive for arthralgias, back pain, myalgias, neck pain and neck stiffness.  Skin: Negative.   Allergic/Immunologic: Negative.   Neurological:  Positive for dizziness.       Tingling   Hematological: Negative.   Psychiatric/Behavioral: The patient is nervous/anxious.        Objective:   Physical Exam Vitals and nursing note reviewed.  Constitutional:      Appearance: She is obese.  HENT:  Head: Normocephalic and atraumatic.     Mouth/Throat:     Mouth: Mucous membranes are dry.  Eyes:     Extraocular Movements: Extraocular movements intact.     Conjunctiva/sclera: Conjunctivae normal.     Pupils: Pupils are equal, round, and reactive to light.  Cardiovascular:     Rate and Rhythm: Normal rate and regular rhythm.     Heart sounds: Normal heart sounds. No murmur.  Pulmonary:     Effort: Pulmonary effort is normal. No respiratory distress.     Breath sounds: Normal breath sounds. No wheezing.  Abdominal:     General: Abdomen is flat. Bowel sounds are normal. There is no distension.     Palpations: Abdomen is soft.  Neurological:     Mental Status: She is alert.   Motor strength is 5/5 bilateral deltoid biceps triceps Lower extremity strength limited by pain 4/5 in the hip flexors knee extensors ankle dorsiflexors Patient has normal range of motion in the shoulders reduced cervical spine range of motion with flexion and extension limited to approximately 50%.  Lateral bending to the right is 50% to the left is 75% There is tenderness palpation bilateral upper trapezius bilateral sternocostal area bilateral medial knee bilateral lateral epicondyle and bilateral hip as well as bilateral low back for a total of 12 fibromyalgia tender points. Negative straight leg raising bilaterally Gait without evidence of toe drag or knee instability.  No assistive device required. Sensation intact to light touch bilateral upper and lower limbs Hip knee and ankle range of motion are normal Spine has reduced forward flexion to 50% extension is to 25%.  The patient has more pain with flexion than with extension. There is tenderness over  bilateral PSIS area.        Assessment & Plan:  #1.  Widespread pain of several etiologies.  Her primary complaint is low back and buttock pain.  Given her history of inflammatory bowel disease as well as history of lumbar fusion as well as her physical exam finding this most likely represents sacroiliac inflammation or sacroiliac mediated pain.  May consider sacroiliac injections however will first treat medically and then follow it with physical therapy. She may increase her tramadol to 50 mg 3 times daily avoid NSAIDs given her inflammatory bowel disease and age  #2.  Fibromyalgia syndrome syndrome she does have 12 out of 18 tender points positive, tramadol should be helpful, continue Cymbalta 30 mg/day, add Lyrica 50 mg twice daily.  Would not go up on the Cymbalta due to being on tramadol but should be fine on current doses.  Gentle range of motion and moderate aerobic exercise would be helpful for this possibly referral for aquatic therapy  #3.  Bilateral knee pain no evidence of joint swelling no erythema no pain with range of motion.  We will continue to monitor she may do well with diclofenac gel

## 2019-03-26 NOTE — Patient Instructions (Signed)
Will start Lyrica for fibromyalgia  May increase tramadol to 3 a day  Follow up in 6 weeks

## 2019-04-21 ENCOUNTER — Other Ambulatory Visit: Payer: Self-pay | Admitting: Rheumatology

## 2019-04-22 NOTE — Telephone Encounter (Signed)
Last Visit: 03/04/19 Next Visit: 09/05/19   Okay to refill per Dr. Estanislado Pandy

## 2019-05-07 ENCOUNTER — Encounter: Payer: Self-pay | Admitting: Physical Medicine & Rehabilitation

## 2019-05-07 ENCOUNTER — Encounter: Payer: Medicare Other | Attending: Physical Medicine & Rehabilitation | Admitting: Physical Medicine & Rehabilitation

## 2019-05-07 ENCOUNTER — Other Ambulatory Visit: Payer: Self-pay

## 2019-05-07 VITALS — BP 163/89 | HR 63 | Temp 97.2°F | Ht 63.0 in | Wt 193.0 lb

## 2019-05-07 DIAGNOSIS — M797 Fibromyalgia: Secondary | ICD-10-CM | POA: Insufficient documentation

## 2019-05-07 DIAGNOSIS — Z5181 Encounter for therapeutic drug level monitoring: Secondary | ICD-10-CM | POA: Diagnosis not present

## 2019-05-07 DIAGNOSIS — G894 Chronic pain syndrome: Secondary | ICD-10-CM | POA: Diagnosis not present

## 2019-05-07 DIAGNOSIS — M1711 Unilateral primary osteoarthritis, right knee: Secondary | ICD-10-CM | POA: Diagnosis not present

## 2019-05-07 DIAGNOSIS — M533 Sacrococcygeal disorders, not elsewhere classified: Secondary | ICD-10-CM | POA: Diagnosis not present

## 2019-05-07 DIAGNOSIS — Z79891 Long term (current) use of opiate analgesic: Secondary | ICD-10-CM | POA: Insufficient documentation

## 2019-05-07 DIAGNOSIS — M1712 Unilateral primary osteoarthritis, left knee: Secondary | ICD-10-CM | POA: Diagnosis not present

## 2019-05-07 MED ORDER — PREGABALIN 50 MG PO CAPS: 50.0000 mg | ORAL_CAPSULE | Freq: Two times a day (BID) | ORAL | 1 refills | Status: DC

## 2019-05-07 NOTE — Progress Notes (Signed)
Subjective:    Patient ID: Desiree Hogan, female    DOB: Jan 25, 1955, 64 y.o.   MRN: [id number] She has a history of lumbar fusion L3-4 L4-5 as well as a history of left lumbar radiculopathy.  The patient had her last spine surgery in 2011 by Dr. Sherwood Gambler.  There was a removal of L3-L4 interbody implant as well as left L3 pedicle screw. HPI 64 year old female with widespread body pain. Neck , shoulder back are worst, also knees , pins and needles in legs and arms.  Last seen approximately 6 weeks ago.  Was started on pregabalin 25 mg twice daily Also tramadol was increased to 50 mg 3 times daily The duloxetine 30 mg/day was continued. Prescribed tizanidine 4 mg nightly by her rheumatologist.  01/24/19 UDS was reviewed and appropriate  L>R knee pain, discussed cortisone and gel with rheumatology but pt never tried either  Reviewed x-rays demonstrating moderate osteoarthritis of both knees affecting the medial joint space as well as patellofemoral compartment. Pain Inventory Average Pain 7 Pain Right Now 6 My pain is intermittent, stabbing and aching  In the last 24 hours, has pain interfered with the following? General activity 5 Relation with others 8 Enjoyment of life 6 What TIME of day is your pain at its worst? evening Sleep (in general) Fair  Pain is worse with: walking and standing Pain improves with: medication Relief from Meds: 5  Mobility walk without assistance how many minutes can you walk? 15 ability to climb steps?  yes do you drive?  yes  Function retired  Neuro/Psych weakness tingling  Prior Studies Any changes since last visit?  no  Physicians involved in your care Any changes since last visit?  no   Family History  Problem Relation Age of Onset  . Cancer Mother        Colon or vaginal ?  . Deep vein thrombosis Mother   . Hypertension Father   . Diabetes Father   . Heart disease Father        Heart Disease before age 23  . Diabetes Sister    . Diabetes Brother   . Hypertension Brother   . Healthy Son   . Healthy Daughter   . Healthy Daughter   . Healthy Daughter    Social History   Socioeconomic History  . Marital status: Married    Spouse name: Not on file  . Number of children: Not on file  . Years of education: Not on file  . Highest education level: Not on file  Occupational History  . Not on file  Tobacco Use  . Smoking status: Former Smoker    Packs/day: 0.75    Years: 20.00    Pack years: 15.00    Types: Cigarettes    Quit date: 01/03/2009    Years since quitting: 10.3  . Smokeless tobacco: Never Used  Substance and Sexual Activity  . Alcohol use: No  . Drug use: No  . Sexual activity: Not on file  Other Topics Concern  . Not on file  Social History Narrative  . Not on file   Social Determinants of Health   Financial Resource Strain:   . Difficulty of Paying Living Expenses:   Food Insecurity:   . Worried About Charity fundraiser in the Last Year:   . Arboriculturist in the Last Year:   Transportation Needs:   . Film/video editor (Medical):   Marland Kitchen Lack of Transportation (Non-Medical):  Physical Activity:   . Days of Exercise per Week:   . Minutes of Exercise per Session:   Stress:   . Feeling of Stress :   Social Connections:   . Frequency of Communication with Friends and Family:   . Frequency of Social Gatherings with Friends and Family:   . Attends Religious Services:   . Active Member of Clubs or Organizations:   . Attends Archivist Meetings:   Marland Kitchen Marital Status:    Past Surgical History:  Procedure Laterality Date  . BIOPSY  12/20/2017   Procedure: BIOPSY;  Surgeon: Rogene Houston, MD;  Location: [facility];  Service: Endoscopy;;  cecal erosions  . CESAREAN SECTION  06/24/82  . CHOLECYSTECTOMY  1993   Gall Bladder  . COLONOSCOPY  Jan. 31, 2014  . COLONOSCOPY N/A 12/20/2017   Procedure: COLONOSCOPY;  Surgeon: Rogene Houston, MD;  Location: [facility];   Service: Endoscopy;  Laterality: N/A;  2:25  . [place] SURGERY  2010 and 2011  . TONSILLECTOMY     Past Medical History:  Diagnosis Date  . Abdominal pain   . Anemia   . Back pain   . Colitis   . COPD (chronic obstructive pulmonary disease) (HCC)    not on home o2  . Crohn's disease ([place]) 03/29/12  . Diabetes mellitus without complication ([place])   . Fibromyalgia   . H/O breast biopsy   . Hip pain   . Hyperlipidemia   . Hypothyroidism   . Irritable bowel syndrome   . Joint pain   . Nonspecific abnormal finding in stool contents   . Peripheral vascular disease ([place])   . Ulcer    Mouth   BP (!) 163/89   Pulse 63   Temp (!) 97.2 F (36.2 C)   Ht 5' 3"  (1.6 m)   Wt 193 lb (87.5 kg)   SpO2 93%   BMI 34.19 kg/m   Opioid Risk Score:   Fall Risk Score:  `1  Depression screen PHQ 2/9  Depression screen PHQ 2/9 03/26/2019  Decreased Interest 2  Down, Depressed, Hopeless 1  PHQ - 2 Score 3  Altered sleeping 2  Tired, decreased energy 2  Change in appetite 0  Feeling bad or failure about yourself  0  Trouble concentrating 2  Moving slowly or fidgety/restless 1  Suicidal thoughts 0  PHQ-9 Score 10    Review of Systems  Constitutional: Positive for diaphoresis.  HENT: Negative.   Eyes: Negative.   Respiratory: Negative.   Cardiovascular: Negative.   Gastrointestinal: Negative.   Endocrine:       High blood sugar  Genitourinary: Negative.   Musculoskeletal: Positive for arthralgias and back pain.  Skin: Negative.   Allergic/Immunologic: Negative.   Neurological: Positive for weakness.       Tingling   Hematological: Negative.   Psychiatric/Behavioral: Negative.   All other systems reviewed and are negative.      Objective:   Physical Exam Vitals and nursing note reviewed.  Constitutional:      Appearance: Normal appearance. She is obese.  HENT:     Head: Normocephalic and atraumatic.  Eyes:     Extraocular Movements: Extraocular movements intact.      Conjunctiva/sclera: Conjunctivae normal.     Pupils: Pupils are equal, round, and reactive to light.  Musculoskeletal:        General: Tenderness and deformity present.     Right knee: No swelling, deformity, effusion or erythema. Tenderness  present over the medial joint line and patellar tendon.     Left knee: No swelling, deformity, effusion or erythema. Decreased range of motion. Tenderness present over the medial joint line and patellar tendon.     Right lower leg: No edema.     Left lower leg: No edema.     Comments: Flat back Incisions lumbar spine well-healed There is tenderness bilateral PSIS.  There is pain with lumbar twisting as well as with lateral bending.  No pain with forward flexion mild pain with extension There is 75% range with forward flexion 0 to 25% range with lumbar extension lumbar lateral bending 0 to 25% Lumbar twisting 0 25% of normal Negative straight leg raising  Skin:    General: Skin is warm and dry.  Neurological:     General: No focal deficit present.     Mental Status: She is alert and oriented to person, place, and time.     Gait: Gait normal.     Comments: Motor strength is 5/5 bilateral deltoid by stress grip hip flexion   Psychiatric:        Mood and Affect: Mood normal.        Behavior: Behavior normal.        Thought Content: Thought content normal.        Judgment: Judgment normal.           Assessment & Plan:  #1.  Lumbar postlaminectomy syndrome she has pain mainly below the waist she has a history of L L5-S1 fusion.  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  Will make referral to physical therapy.  If no better may consider sacroiliac injections  #2.  Bilateral knee osteoarthritis, moderate, more symptomatic on the left side.  Will make referral to physical therapy.  If no better may consider corticosteroid injections  #3.  Fibromyalgia syndrome some improvement with tramadol 50 mg 3 times daily, no noticeable effect from  pregabalin 25 mg twice daily, will increase to 50 mg twice daily, continue duloxetine 30 mg/day We will check UDS Sign controlled substance agreement today as well as opioid consent.

## 2019-05-07 NOTE — Patient Instructions (Signed)
Sacroiliac Joint Dysfunction  Sacroiliac joint dysfunction is a condition that causes inflammation on one or both sides of the sacroiliac (SI) joint. The SI joint connects the lower part of the spine (sacrum) with the two upper portions of the pelvis (ilium). This condition causes deep aching or burning pain in the low back. In some cases, the pain may also spread into one or both buttocks, hips, or thighs. What are the causes? This condition may be caused by:  Pregnancy. During pregnancy, extra stress is put on the SI joints because the pelvis widens.  Injury, such as: ? Injuries from car accidents. ? Sports-related injuries. ? Work-related injuries.  Having one leg that is shorter than the other.  Conditions that affect the joints, such as: ? Rheumatoid arthritis. ? Gout. ? Psoriatic arthritis. ? Joint infection (septic arthritis). Sometimes, the cause of SI joint dysfunction is not known. What are the signs or symptoms? Symptoms of this condition include:  Aching or burning pain in the lower back. The pain may also spread to other areas, such as: ? Buttocks. ? Groin. ? Thighs.  Muscle spasms in or around the painful areas.  Increased pain when standing, walking, running, stair climbing, bending, or lifting. How is this diagnosed? This condition is diagnosed with a physical exam and medical history. During the exam, the health care provider may move one or both of your legs to different positions to check for pain. Various tests may be done to confirm the diagnosis, including:  Imaging tests to look for other causes of pain. These may include: ? MRI. ? CT scan. ? Bone scan.  Diagnostic injection. A numbing medicine is injected into the SI joint using a needle. If your pain is temporarily improved or stopped after the injection, this can indicate that SI joint dysfunction is the problem. How is this treated? Treatment depends on the cause and severity of your condition.  Treatment options may include:  Ice or heat applied to the lower back area after an injury. This may help reduce pain and muscle spasms.  Medicines to relieve pain or inflammation or to relax the muscles.  Wearing a back brace (sacroiliac brace) to help support the joint while your back is healing.  Physical therapy to increase muscle strength around the joint and flexibility at the joint. This may also involve learning proper body positions and ways of moving to relieve stress on the joint.  Direct manipulation of the SI joint.  Injections of steroid medicine into the joint to reduce pain and swelling.  Radiofrequency ablation to burn away nerves that are carrying pain messages from the joint.  Use of a device that provides electrical stimulation to help reduce pain at the joint.  Surgery to put in screws and plates that limit or prevent joint motion. This is rare. Follow these instructions at home: Medicines  Take over-the-counter and prescription medicines only as told by your health care provider.  Do not drive or use heavy machinery while taking prescription pain medicine.  If you are taking prescription pain medicine, take actions to prevent or treat constipation. Your health care provider may recommend that you: ? Drink enough fluid to keep your urine pale yellow. ? Eat foods that are high in fiber, such as fresh fruits and vegetables, whole grains, and beans. ? Limit foods that are high in fat and processed sugars, such as fried or sweet foods. ? Take an over-the-counter or prescription medicine for constipation. If you have a brace:  Wear the brace as told by your health care provider. Remove it only as told by your health care provider.  Keep the brace clean.  If the brace is not waterproof: ? Do not let it get wet. ? Cover it with a watertight covering when you take a bath or a shower. Managing pain, stiffness, and swelling      Icing can help with pain and  swelling. Heat may help with muscle tension or spasms. Ask your health care provider if you should use ice or heat.  If directed, put ice on the affected area: ? If you have a removable brace, remove it as told by your health care provider. ? Put ice in a plastic bag. ? Place a towel between your skin and the bag. ? Leave the ice on for 20 minutes, 2-3 times a day.  If directed, apply heat to the affected area. Use the heat source that your health care provider recommends, such as a moist heat pack or a heating pad. ? Place a towel between your skin and the heat source. ? Leave the heat on for 20-30 minutes. ? Remove the heat if your skin turns bright red. This is especially important if you are unable to feel pain, heat, or cold. You may have a greater risk of getting burned. General instructions  Rest as needed. Ask your health care provider what activities are safe for you.  Return to your normal activities as told by your health care provider.  Exercise as directed by your health care provider or physical therapist.  Do not use any products that contain nicotine or tobacco, such as cigarettes and e-cigarettes. These can delay bone healing. If you need help quitting, ask your health care provider.  Keep all follow-up visits as told by your health care provider. This is important. Contact a health care provider if:  Your pain is not controlled with medicine.  You have a fever.  Your pain is getting worse. Get help right away if:  You have weakness, numbness, or tingling in your legs or feet.  You lose control of your bladder or bowel. Summary  Sacroiliac joint dysfunction is a condition that causes inflammation on one or both sides of the sacroiliac (SI) joint.  This condition causes deep aching or burning pain in the low back. In some cases, the pain may also spread into one or both buttocks, hips, or thighs.  Treatment depends on the cause and severity of your condition.  It may include medicines to reduce pain and swelling or to relax muscles. This information is not intended to replace advice given to you by your health care provider. Make sure you discuss any questions you have with your health care provider. Document Revised: 08/16/2017 Document Reviewed: 01/30/2017 Elsevier Patient Education  2020 [employer].

## 2019-05-09 LAB — TOXASSURE SELECT,+ANTIDEPR,UR

## 2019-05-17 ENCOUNTER — Ambulatory Visit (HOSPITAL_COMMUNITY): Payer: Medicare Other | Attending: Physical Medicine & Rehabilitation | Admitting: Physical Therapy

## 2019-05-17 ENCOUNTER — Other Ambulatory Visit: Payer: Self-pay

## 2019-05-17 ENCOUNTER — Encounter (HOSPITAL_COMMUNITY): Payer: Self-pay | Admitting: Physical Therapy

## 2019-05-17 ENCOUNTER — Telehealth: Payer: Self-pay | Admitting: *Deleted

## 2019-05-17 DIAGNOSIS — M545 Low back pain: Secondary | ICD-10-CM | POA: Insufficient documentation

## 2019-05-17 DIAGNOSIS — R29898 Other symptoms and signs involving the musculoskeletal system: Secondary | ICD-10-CM | POA: Diagnosis not present

## 2019-05-17 DIAGNOSIS — M25561 Pain in right knee: Secondary | ICD-10-CM | POA: Diagnosis not present

## 2019-05-17 DIAGNOSIS — M25562 Pain in left knee: Secondary | ICD-10-CM | POA: Diagnosis not present

## 2019-05-17 DIAGNOSIS — M6281 Muscle weakness (generalized): Secondary | ICD-10-CM | POA: Insufficient documentation

## 2019-05-17 DIAGNOSIS — G8929 Other chronic pain: Secondary | ICD-10-CM | POA: Diagnosis not present

## 2019-05-17 NOTE — Therapy (Signed)
[place], [place], [address] Phone: [phone]   Fax:  [phone]  Physical Therapy Evaluation  Patient Details  Name: Desiree Hogan MRN: [id number] Date of Birth: 09-03-1955 Referring Provider (PT): Letta Pate Luanna Salk, MD   Encounter Date: 05/17/2019  PT End of Session - 05/17/19 1507    Visit Number  1    Number of Visits  12    Date for PT Re-Evaluation  06/28/19    Authorization Type  [employer] Medicare    Progress Note Due on Visit  10    PT Start Time  1425    PT Stop Time  1500    PT Time Calculation (min)  35 min    Activity Tolerance  Patient tolerated treatment well    Behavior During Therapy  [facility] for tasks assessed/performed       Past Medical History:  Diagnosis Date  . Abdominal pain   . Anemia   . Back pain   . Colitis   . COPD (chronic obstructive pulmonary disease) (HCC)    not on home o2  . Crohn's disease ([place]) 03/29/12  . Diabetes mellitus without complication ([place])   . Fibromyalgia   . H/O breast biopsy   . Hip pain   . Hyperlipidemia   . Hypothyroidism   . Irritable bowel syndrome   . Joint pain   . Nonspecific abnormal finding in stool contents   . Peripheral vascular disease ([place])   . Ulcer    Mouth    Past Surgical History:  Procedure Laterality Date  . BIOPSY  12/20/2017   Procedure: BIOPSY;  Surgeon: Rogene Houston, MD;  [facility];  Service: Endoscopy;;  cecal erosions  . CESAREAN SECTION  06/24/82  . CHOLECYSTECTOMY  1993   Gall Bladder  . COLONOSCOPY  Jan. 31, 2014  . COLONOSCOPY N/A 12/20/2017   Procedure: COLONOSCOPY;  Surgeon: Rogene Houston, MD;  Location[facility];  Service: Endoscopy;  Laterality: N/A;  2:25  . Time SURGERY  2010 and 2011  . TONSILLECTOMY      There were no vitals filed for this visit.   Subjective Assessment - 05/17/19 1432    Subjective  Patient reported both of her knees have been bothering her for about 3  months. Patient reported that the pain is greatest when she is trying to walk or stand. She reported that at times it feels like her knee is trying to lock up or something. She reported the pain stays right round the knee area. Patient reported that she does have tingling into her legs, and that MD stated that the tingling is likely due to the patient's fibromyalgia. Patient reported that she does have some back pain too, but that right now she would like to focus on her knee pain. Patient reported the back pain is in the lower back and is usually an aching pain, with occasional shooting pains. Denied any changes in bowel and bladder function and reported a history of Crohn's disease.    Pertinent History  Bilateral knee pain, back pain of insidious onset    Limitations  House hold activities;Standing;Walking    How long can you sit comfortably?  Up and down constantly    How long can you stand comfortably?  10 minutes    How long can you walk comfortably?  0 minutes    Diagnostic tests  X-rays bil Knees OA    Patient Stated  Goals  Have less pain    Currently in Pain?  Yes    Pain Score  4     Pain Location  Knee    Pain Orientation  Right;Left    Pain Descriptors / Indicators  Aching    Pain Type  Chronic pain    Pain Onset  More than a month ago    Pain Frequency  Constant    Aggravating Factors   Standing and walking    Pain Relieving Factors  laying down         [facility] PT Assessment - 05/17/19 0001      Assessment   Medical Diagnosis  Osteoarthritis bilateral knees, Sacroiliac joint disease    Referring Provider (PT)  Letta Pate, Luanna Salk, MD    Onset Date/Surgical Date  --   About 3 months   Next MD Visit  --   Early June     Precautions   Precautions  None      Restrictions   Weight Bearing Restrictions  No      Balance Screen   Has the patient fallen in the past 6 months  No    Has the patient had a decrease in activity level because of a fear of falling?   Yes    Is the  patient reluctant to leave their home because of a fear of falling?   No      Home Environment   Living Environment  Other (Comment)    Additional Comments  Double wide   Level entry     Prior Function   Level of Independence  Independent;Independent with basic ADLs    Vocation  Retired      Charity fundraiser Status  Within Functional Limits for tasks assessed      Observation/Other Assessments   Focus on Therapeutic Outcomes (FOTO)   51%      ROM / Strength   AROM / PROM / Strength  AROM;Strength      AROM   Overall AROM Comments  Hip IR/ER limited by 50% bilaterally. Pain limited.     AROM Assessment Site  Hip;Knee;Ankle;Lumbar    Right/Left Knee  Right;Left    Right Knee Extension  0    Right Knee Flexion  80    Left Knee Extension  0    Left Knee Flexion  85    Lumbar Flexion  90% limited painful    Lumbar Extension  90% limited painful     Lumbar - Right Side Bend  75% limited painful    Lumbar - Left Side Bend  75% limited painful    Lumbar - Right Rotation  90% limited painful    Lumbar - Left Rotation  90% limited painful      Strength   Strength Assessment Site  Hip;Knee;Ankle    Right/Left Hip  Right;Left    Right Hip Flexion  3/5    Right Hip Extension  2+/5    Right Hip ABduction  3/5    Left Hip Flexion  3/5    Left Hip Extension  2+/5    Left Hip ABduction  3/5    Right/Left Knee  Right;Left    Right Knee Flexion  3/5    Right Knee Extension  3/5    Left Knee Flexion  3/5    Left Knee Extension  3/5    Right/Left Ankle  Right;Left    Right Ankle Dorsiflexion  4/5    Left Ankle  Dorsiflexion  4/5      Ambulation/Gait   Ambulation/Gait  Yes    Ambulation Distance (Feet)  235 Feet    Assistive device  None    Gait Pattern  Decreased hip/knee flexion - right;Decreased hip/knee flexion - left;Antalgic;Decreased trunk rotation                  Objective measurements completed on examination: See above findings.               PT Education - 05/17/19 1507    Education Details  Discussed examination findings, POC, and initial HEP.    Person(s) Educated  Patient    Methods  Explanation;Handout    Comprehension  Verbalized understanding       PT Short Term Goals - 05/17/19 1508      PT SHORT TERM GOAL #1   Title  Patient will report understanding and regular compliance with HEP to improve strength, decrease pain, and improve overall mobility.    Time  3    Period  Weeks    Status  New    Target Date  06/07/19      PT SHORT TERM GOAL #2   Title  Patient will report an improvement of at least 25% in overall subjective complaint for improved QoL.    Time  3    Period  Weeks    Status  New    Target Date  06/07/19        PT Long Term Goals - 05/17/19 1510      PT LONG TERM GOAL #1   Title  Patient will report an improvement of at least 50% in overall subjective complaint for improved QoL.    Time  6    Period  Weeks    Status  New    Target Date  06/28/19      PT LONG TERM GOAL #2   Title  Patient will demonstrate ability to ambulate an additional 50 feet on the 2MWT indicating improved gait velocity and improved community ambulation.    Time  6    Period  Weeks    Status  New    Target Date  06/28/19      PT LONG TERM GOAL #3   Title  Patient will report ability to ambulate for at least 5 minutes with minimal to no increase in pain in order to perform household ambulation with improved ease.    Time  6    Period  Weeks    Status  New    Target Date  06/28/19      PT LONG TERM GOAL #4   Title  Patient will demonstrate improvement of at least 10% on FOTO indicating improved functional mobility.    Time  6    Period  Weeks    Status  New    Target Date  06/28/19      PT LONG TERM GOAL #5   Title  Additional goals to address back pain may be added in the future.             Plan - 05/17/19 1727    Clinical Impression Statement  Patient is a 64 year old  female who presents to outpatient physical therapy with primary complaints of bilateral knee pain as well as low back pain. Discussed with patient focusing therapy on one area to start and patient reported she would like to focus on her knees initially. Upon examination, noted decreased AROM  of bilateral knees as well as lumbar spine. Noted decreased strength in bilateral lower extremities as well as decreased gait velocity. Patient would benefit from physical therapy to address the abovementioned deficits and help patient return to PLOF. Plan to focus on bilateral knee pain initially and consider focusing on low back pain later on in sessions depending on patient's progress. Discussed possible benefits of aquatic therapy at this time, but patient reported she would like to try in clinic therapy first and may consider aquatic therapy later on.    Personal Factors and Comorbidities  Age;Comorbidity 3+    Comorbidities  Fibromyalgia, Crohn's disease, DM    Examination-Activity Limitations  Stand;Locomotion Level;Transfers;Squat;Stairs    Examination-Participation Restrictions  Cleaning;Meal Prep;Community Activity;Laundry;Shop    Stability/Clinical Decision Making  Evolving/Moderate complexity    Clinical Decision Making  Moderate    Rehab Potential  Fair    PT Frequency  2x / week    PT Duration  6 weeks    PT Treatment/Interventions  ADLs/Self Care Home Management;Aquatic Therapy;Cryotherapy;Electrical Stimulation;Moist Heat;DME Instruction;Gait training;Stair training;Functional mobility training;Therapeutic activities;Therapeutic exercise;Balance training;Neuromuscular re-education;Patient/family education;Orthotic Fit/Training;Manual techniques;Passive range of motion;Dry needling;Energy conservation;Taping    PT Next Visit Plan  Review eval/goals. Review HEP add: heel slides if tolerated, bridges, clams. Manual therapy for pain relief gentle tib/fem jt distraction. Focus on Knee pain initially  re-assess for back pain in future sessions PRN.    PT Home Exercise Plan  05/17/19: Quad sets    Consulted and Agree with Plan of Care  Patient       Patient will benefit from skilled therapeutic intervention in order to improve the following deficits and impairments:  Abnormal gait, Pain, Improper body mechanics, Decreased mobility, Decreased activity tolerance, Decreased endurance, Decreased range of motion, Decreased strength, Hypomobility, Difficulty walking  Visit Diagnosis: Chronic pain of left knee  Chronic pain of right knee  Chronic bilateral low back pain, unspecified whether sciatica present  Muscle weakness (generalized)  Other symptoms and signs involving the musculoskeletal system     Problem List Patient Active Problem List   Diagnosis Date Noted  . Diverticulitis of colon 10/11/2017  . Crohn's colitis ([place]) 10/11/2017  . Fibromyalgia 07/14/2017  . ANA positive 07/14/2017  . Primary osteoarthritis of both knees 07/14/2017  . DDD (degenerative disc disease), lumbar 07/14/2017  . Hypermobility of joint 07/14/2017  . Age-related osteoporosis without current pathological fracture 07/14/2017  . Other insomnia 07/14/2017  . History of Crohn's disease 07/14/2017  . History of IBS 07/14/2017  . History of hypercholesterolemia 07/14/2017  . History of COPD 07/14/2017  . History of cholecystectomy 07/14/2017  . Community acquired pneumonia 12/13/2016  . Pneumonia 12/13/2016  . Leukocytosis 12/13/2016  . Hypokalemia 12/13/2016  . Diabetes mellitus without complication ([place]) 96/04/5407   Clarene Critchley PT, DPT 5:35 PM, 05/17/19 [place], [place], [address] Phone: [phone]   Fax:  [phone]  Name: Desiree Hogan MRN: [id number] Date of Birth: 1955/03/09

## 2019-05-17 NOTE — Telephone Encounter (Signed)
Urine drug screen for this encounter is consistent for prescribed medication 

## 2019-05-17 NOTE — Patient Instructions (Signed)
Quad Set    With other leg bent, foot flat, slowly tighten muscles on thigh of straight leg while counting out loud to __5__. Repeat with other leg. Repeat _10___ times. Do __1-2__ sessions per day.  http://gt2.exer.us/276   Copyright  VHI. All rights reserved.

## 2019-05-19 ENCOUNTER — Other Ambulatory Visit: Payer: Self-pay | Admitting: Rheumatology

## 2019-05-20 ENCOUNTER — Telehealth (HOSPITAL_COMMUNITY): Payer: Self-pay

## 2019-05-20 NOTE — Telephone Encounter (Signed)
pt called to cx both appts due to she has other appts pending on the same days

## 2019-05-20 NOTE — Telephone Encounter (Signed)
Last Visit: 03/04/19 Next Visit: 09/05/19   Okay to refill per Dr. Estanislado Pandy

## 2019-05-21 ENCOUNTER — Ambulatory Visit (HOSPITAL_COMMUNITY): Payer: Medicare Other

## 2019-05-22 ENCOUNTER — Ambulatory Visit (HOSPITAL_COMMUNITY): Payer: Medicare Other | Admitting: Physical Therapy

## 2019-05-27 ENCOUNTER — Other Ambulatory Visit: Payer: Self-pay

## 2019-05-27 ENCOUNTER — Ambulatory Visit (HOSPITAL_COMMUNITY): Payer: Medicare Other | Admitting: Physical Therapy

## 2019-05-27 ENCOUNTER — Encounter (HOSPITAL_COMMUNITY): Payer: Self-pay | Admitting: Physical Therapy

## 2019-05-27 DIAGNOSIS — R29898 Other symptoms and signs involving the musculoskeletal system: Secondary | ICD-10-CM | POA: Diagnosis not present

## 2019-05-27 DIAGNOSIS — G8929 Other chronic pain: Secondary | ICD-10-CM | POA: Diagnosis not present

## 2019-05-27 DIAGNOSIS — M545 Low back pain, unspecified: Secondary | ICD-10-CM

## 2019-05-27 DIAGNOSIS — M6281 Muscle weakness (generalized): Secondary | ICD-10-CM | POA: Diagnosis not present

## 2019-05-27 DIAGNOSIS — M25562 Pain in left knee: Secondary | ICD-10-CM | POA: Diagnosis not present

## 2019-05-27 DIAGNOSIS — M25561 Pain in right knee: Secondary | ICD-10-CM | POA: Diagnosis not present

## 2019-05-27 NOTE — Therapy (Signed)
[place], [place], [address] Phone: [phone]   Fax:  [phone]  Physical Therapy Treatment  Patient Details  Name: Desiree Hogan MRN: [id number] Date of Birth: 10-26-1955 Referring Provider (PT): Letta Pate Luanna Salk, MD   Encounter Date: 05/27/2019  PT End of Session - 05/27/19 1357    Visit Number  2    Number of Visits  12    Date for PT Re-Evaluation  06/28/19    Authorization Type  [employer]HC Medicare    Progress Note Due on Visit  10    PT Start Time  1351    PT Stop Time  1429    PT Time Calculation (min)  38 min    Activity Tolerance  Patient tolerated treatment well    Behavior During Therapy  [facility] for tasks assessed/performed       Past Medical History:  Diagnosis Date  . Abdominal pain   . Anemia   . Back pain   . Colitis   . COPD (chronic obstructive pulmonary disease) (HCC)    not on home o2  . Crohn's disease ([place]) 03/29/12  . Diabetes mellitus without complication ([place])   . Fibromyalgia   . H/O breast biopsy   . Hip pain   . Hyperlipidemia   . Hypothyroidism   . Irritable bowel syndrome   . Joint pain   . Nonspecific abnormal finding in stool contents   . Peripheral vascular disease ([place])   . Ulcer    Mouth    Past Surgical History:  Procedure Laterality Date  . BIOPSY  12/20/2017   Procedure: BIOPSY;  Surgeon: Rogene Houston, MD;  [facility];  Service: Endoscopy;;  cecal erosions  . CESAREAN SECTION  06/24/82  . CHOLECYSTECTOMY  1993   Gall Bladder  . COLONOSCOPY  Jan. 31, 2014  . COLONOSCOPY N/A 12/20/2017   Procedure: COLONOSCOPY;  Surgeon: Rogene Houston, MD;  [facility];  Service: Endoscopy;  Laterality: N/A;  2:25  . [place] SURGERY  2010 and 2011  . TONSILLECTOMY      There were no vitals filed for this visit.  Subjective Assessment - 05/27/19 1434    Subjective  Patient denied any pain, but reported some stiffness.    Pertinent History   Bilateral knee pain, back pain of insidious onset    Limitations  House hold activities;Standing;Walking    How long can you sit comfortably?  Up and down constantly    How long can you stand comfortably?  10 minutes    How long can you walk comfortably?  0 minutes    Diagnostic tests  X-rays bil Knees OA    Patient Stated Goals  Have less pain    Currently in Pain?  No/denies         [facility] PT Assessment - 05/27/19 0001      Observation/Other Assessments   Observations  Cyst on RT foot                    [facility] Adult PT Treatment/Exercise - 05/27/19 0001      Exercises   Exercises  Knee/Hip      Knee/Hip Exercises: Supine   Quad Sets  Strengthening;Right;Left;1 set;10 reps    Heel Slides  AROM;Right;Left;1 set;15 reps    Constance Haw  Strengthening;Both;2 sets;5 sets    Bridges Limitations  Patient reports it is difficult    Other Supine Knee/Hip Exercises  Ab set x15     Other Supine Knee/Hip Exercises  Ab set with marching x20 alternating LEs 2'' holds      Knee/Hip Exercises: Sidelying   Clams  15x each side. VCs and tactile cues for form.       Manual Therapy   Manual Therapy  Joint mobilization    Manual therapy comments  all manual completed separately from other skilled interventions    Joint Mobilization  Tibiofemoral joint distraction grade II with gentle oscillations each LE patient in sitting             PT Education - 05/27/19 1435    Education Details  Goals, knee anatomy, and purpose/technique of interventions throughout session.    Person(s) Educated  Patient    Methods  Explanation    Comprehension  Verbalized understanding       PT Short Term Goals - 05/27/19 1353      PT SHORT TERM GOAL #1   Title  Patient will report understanding and regular compliance with HEP to improve strength, decrease pain, and improve overall mobility.    Time  3    Period  Weeks    Status  On-going    Target Date  06/07/19      PT SHORT TERM GOAL #2    Title  Patient will report an improvement of at least 25% in overall subjective complaint for improved QoL.    Time  3    Period  Weeks    Status  On-going    Target Date  06/07/19        PT Long Term Goals - 05/27/19 1353      PT LONG TERM GOAL #1   Title  Patient will report an improvement of at least 50% in overall subjective complaint for improved QoL.    Time  6    Period  Weeks    Status  On-going      PT LONG TERM GOAL #2   Title  Patient will demonstrate ability to ambulate an additional 50 feet on the 2MWT indicating improved gait velocity and improved community ambulation.    Time  6    Period  Weeks    Status  On-going      PT LONG TERM GOAL #3   Title  Patient will report ability to ambulate for at least 5 minutes with minimal to no increase in pain in order to perform household ambulation with improved ease.    Time  6    Period  Weeks    Status  On-going      PT LONG TERM GOAL #4   Title  Patient will demonstrate improvement of at least 10% on FOTO indicating improved functional mobility.    Time  6    Period  Weeks    Status  On-going      PT LONG TERM GOAL #5   Title  Additional goals to address back pain may be added in the future.            Plan - 05/27/19 1435    Clinical Impression Statement  Began by reviewing patient's evaluation, goals and HEP. Patient demonstrated good understanding of HEP. Focused on LE strengthening exercises this session as well as introducing some abdominal strengthening. Patient did demonstrate some difficulty with the bridges therefore decreased number of repetitions and increased sets. Ended session with manual therapy including tibial/femoral distraction for pain relief on bilateral LEs. Patient reported some improvement in  discomfort at end of session.    Personal Factors and Comorbidities  Age;Comorbidity 3+    Comorbidities  Fibromyalgia, Crohn's disease, DM    Examination-Activity Limitations  Stand;Locomotion  Level;Transfers;Squat;Stairs    Examination-Participation Restrictions  Cleaning;Meal Prep;Community Activity;Laundry;Shop    Stability/Clinical Decision Making  Evolving/Moderate complexity    Rehab Potential  Fair    PT Frequency  2x / week    PT Duration  6 weeks    PT Treatment/Interventions  ADLs/Self Care Home Management;Aquatic Therapy;Cryotherapy;Electrical Stimulation;Moist Heat;DME Instruction;Gait training;Stair training;Functional mobility training;Therapeutic activities;Therapeutic exercise;Balance training;Neuromuscular re-education;Patient/family education;Orthotic Fit/Training;Manual techniques;Passive range of motion;Dry needling;Energy conservation;Taping    PT Next Visit Plan  Add heel slides and clams to HEP. Trial bike for ROM and pain relief.  Manual therapy for pain relief gentle tib/fem jt distraction. Focus on Knee pain initially re-assess for back pain in future sessions PRN.    PT Home Exercise Plan  05/17/19: Quad sets    Consulted and Agree with Plan of Care  Patient       Patient will benefit from skilled therapeutic intervention in order to improve the following deficits and impairments:  Abnormal gait, Pain, Improper body mechanics, Decreased mobility, Decreased activity tolerance, Decreased endurance, Decreased range of motion, Decreased strength, Hypomobility, Difficulty walking  Visit Diagnosis: Chronic pain of left knee  Chronic pain of right knee  Chronic bilateral low back pain, unspecified whether sciatica present  Muscle weakness (generalized)  Other symptoms and signs involving the musculoskeletal system     Problem List Patient Active Problem List   Diagnosis Date Noted  . Diverticulitis of colon 10/11/2017  . Crohn's colitis ([place]) 10/11/2017  . Fibromyalgia 07/14/2017  . ANA positive 07/14/2017  . Primary osteoarthritis of both knees 07/14/2017  . DDD (degenerative disc disease), lumbar 07/14/2017  . Hypermobility of joint 07/14/2017  .  Age-related osteoporosis without current pathological fracture 07/14/2017  . Other insomnia 07/14/2017  . History of Crohn's disease 07/14/2017  . History of IBS 07/14/2017  . History of hypercholesterolemia 07/14/2017  . History of COPD 07/14/2017  . History of cholecystectomy 07/14/2017  . Community acquired pneumonia 12/13/2016  . Pneumonia 12/13/2016  . Leukocytosis 12/13/2016  . Hypokalemia 12/13/2016  . Diabetes mellitus without complication ([place]) 88/89/1694    Clarene Critchley PT, DPT 2:37 PM, 05/27/19 [place], [place], [address] Phone: [phone]   Fax:  [phone]  Name: BELISSA KOOY MRN: [id number] Date of Birth: 1955/12/09

## 2019-05-29 ENCOUNTER — Ambulatory Visit (HOSPITAL_COMMUNITY): Payer: Medicare Other

## 2019-05-29 ENCOUNTER — Other Ambulatory Visit: Payer: Self-pay

## 2019-05-29 ENCOUNTER — Encounter (HOSPITAL_COMMUNITY): Payer: Self-pay

## 2019-05-29 DIAGNOSIS — M6281 Muscle weakness (generalized): Secondary | ICD-10-CM

## 2019-05-29 DIAGNOSIS — R29898 Other symptoms and signs involving the musculoskeletal system: Secondary | ICD-10-CM

## 2019-05-29 DIAGNOSIS — M25562 Pain in left knee: Secondary | ICD-10-CM

## 2019-05-29 DIAGNOSIS — M545 Low back pain, unspecified: Secondary | ICD-10-CM

## 2019-05-29 DIAGNOSIS — M25561 Pain in right knee: Secondary | ICD-10-CM

## 2019-05-29 DIAGNOSIS — G8929 Other chronic pain: Secondary | ICD-10-CM | POA: Diagnosis not present

## 2019-05-29 NOTE — Patient Instructions (Signed)
HIP / KNEE: Flexion, Heel Slides - Supine    Slide heel up toward buttocks, keeping leg in straight line. 10 reps per set, 2 sets per day, 4 days per week Use towel or pillowcase under heel as needed.  Copyright  VHI. All rights reserved.   Abduction: Clam (Eccentric) - Side-Lying    Lie on side with knees bent. Lift top knee, keeping feet together. Keep trunk steady. Slowly lower for 3-5 seconds. 10 reps per set, 2 sets per day, 4 days per week.  http://ecce.exer.us/65   Copyright  VHI. All rights reserved.

## 2019-05-29 NOTE — Therapy (Signed)
[place], [place], [address] Phone: [phone]   Fax:  [phone]  Physical Therapy Treatment  Patient Details  Name: Desiree Hogan MRN: [id number] Date of Birth: 1955/10/18 Referring Provider (PT): Letta Pate Luanna Salk, MD   Encounter Date: 05/29/2019  PT End of Session - 05/29/19 1456    Visit Number  3    Number of Visits  12    Date for PT Re-Evaluation  06/28/19    Authorization Type  UHC Medicare    Progress Note Due on Visit  10    PT Start Time  1450    PT Stop Time  1528    PT Time Calculation (min)  38 min    Activity Tolerance  Patient tolerated treatment well;No increased pain    Behavior During Therapy  WFL for tasks assessed/performed       Past Medical History:  Diagnosis Date  . Abdominal pain   . Anemia   . Back pain   . Colitis   . COPD (chronic obstructive pulmonary disease) (HCC)    not on home o2  . Crohn's disease ([place]) 03/29/12  . Diabetes mellitus without complication ([place])   . Fibromyalgia   . H/O breast biopsy   . Hip pain   . Hyperlipidemia   . Hypothyroidism   . Irritable bowel syndrome   . Joint pain   . Nonspecific abnormal finding in stool contents   . Peripheral vascular disease ([place])   . Ulcer    Mouth    Past Surgical History:  Procedure Laterality Date  . BIOPSY  12/20/2017   Procedure: BIOPSY;  Surgeon: Rogene Houston, MD;  [facility];  Service: Endoscopy;;  cecal erosions  . CESAREAN SECTION  06/24/82  . CHOLECYSTECTOMY  1993   Gall Bladder  . COLONOSCOPY  Jan. 31, 2014  . COLONOSCOPY N/A 12/20/2017   Procedure: COLONOSCOPY;  Surgeon: Rogene Houston, MD;  [facility];  Service: Endoscopy;  Laterality: N/A;  2:25  . [place] SURGERY  2010 and 2011  . TONSILLECTOMY      There were no vitals filed for this visit.  Subjective Assessment - 05/29/19 1455    Subjective  Pt stated her shoulders are bothering her today, believes  related to fibromyalgia.  No reports of pain in knee of back today.    Pertinent History  Bilateral knee pain, back pain of insidious onset    Patient Stated Goals  Have less pain    Currently in Pain?  No/denies   stated her shoulders are bothering her though no pain scale given                       [facility] Adult PT Treatment/Exercise - 05/29/19 0001      Exercises   Exercises  Knee/Hip      Knee/Hip Exercises: Aerobic   Stationary Bike  Full revolution seat 7 x 4 min (not included wiht charges      Knee/Hip Exercises: Supine   Quad Sets  Strengthening;Right;Left;1 set;10 reps    Heel Slides  AROM;Right;Left;1 set;15 reps    Constance Haw  Strengthening;Both;2 sets;5 sets    Bridges Limitations  Patient reports it is difficult    Straight Leg Raises  Right;Left;5 reps    Knee Extension  AROM;Right;Left    Knee Flexion  AROM;Left;Right    Knee Flexion Limitations  Rt 110; Lt 112  Other Supine Knee/Hip Exercises  ab set paired with exhalation 3" holds; ab set with marches 10x 3"    Other Supine Knee/Hip Exercises  gluteal sets 10x      Knee/Hip Exercises: Sidelying   Clams  15x each side RTB with verbal and tactile cueing for form/mechanics      Knee/Hip Exercises: Prone   Other Prone Exercises  heel squeeze 5x 5"      Manual Therapy   Manual Therapy  Joint mobilization    Manual therapy comments  all manual completed separately from other skilled interventions    Joint Mobilization  Tibiofemoral joint distraction grade II with gentle oscillations each LE patient in sitting               PT Short Term Goals - 05/27/19 1353      PT SHORT TERM GOAL #1   Title  Patient will report understanding and regular compliance with HEP to improve strength, decrease pain, and improve overall mobility.    Time  3    Period  Weeks    Status  On-going    Target Date  06/07/19      PT SHORT TERM GOAL #2   Title  Patient will report an improvement of at least 25% in  overall subjective complaint for improved QoL.    Time  3    Period  Weeks    Status  On-going    Target Date  06/07/19        PT Long Term Goals - 05/27/19 1353      PT LONG TERM GOAL #1   Title  Patient will report an improvement of at least 50% in overall subjective complaint for improved QoL.    Time  6    Period  Weeks    Status  On-going      PT LONG TERM GOAL #2   Title  Patient will demonstrate ability to ambulate an additional 50 feet on the 2MWT indicating improved gait velocity and improved community ambulation.    Time  6    Period  Weeks    Status  On-going      PT LONG TERM GOAL #3   Title  Patient will report ability to ambulate for at least 5 minutes with minimal to no increase in pain in order to perform household ambulation with improved ease.    Time  6    Period  Weeks    Status  On-going      PT LONG TERM GOAL #4   Title  Patient will demonstrate improvement of at least 10% on FOTO indicating improved functional mobility.    Time  6    Period  Weeks    Status  On-going      PT LONG TERM GOAL #5   Title  Additional goals to address back pain may be added in the future.            Plan - 05/29/19 1526    Clinical Impression Statement  Session focus on knee mobility and proximal strengthening.  Pt progressing well with vast improvements in knee flexion BLE.  Added heel slides for mobility and clams for gluteal strengthening to HEP, pt able to demonstrate appropriate mechanics followoing min cueing to reduce rolling backwards during clams.  Pt continues to demonstrate weakness in gluteal mm noted by difficulty wiht bridges and easy fatigue wiht other exericses.    Personal Factors and Comorbidities  Age;Comorbidity 3+  Comorbidities  Fibromyalgia, Crohn's disease, DM    Examination-Activity Limitations  Stand;Locomotion Level;Transfers;Squat;Stairs    Examination-Participation Restrictions  Cleaning;Meal Prep;Community Activity;Laundry;Shop     Stability/Clinical Decision Making  Evolving/Moderate complexity    Clinical Decision Making  Moderate    Rehab Potential  Fair    PT Frequency  2x / week    PT Duration  6 weeks    PT Treatment/Interventions  ADLs/Self Care Home Management;Aquatic Therapy;Cryotherapy;Electrical Stimulation;Moist Heat;DME Instruction;Gait training;Stair training;Functional mobility training;Therapeutic activities;Therapeutic exercise;Balance training;Neuromuscular re-education;Patient/family education;Orthotic Fit/Training;Manual techniques;Passive range of motion;Dry needling;Energy conservation;Taping    PT Next Visit Plan  Begin controlled STS.  Progress hip strengthening as able.  Continue bike for ROM mobility and pain relief.  Manual therapy for pain relief gentle tib/fem jt distraction. Focus on Knee pain initially re-assess for back pain in future sessions PRN.    PT Home Exercise Plan  05/17/19: Quad sets; 05/29/19: heel slides and clam.       Patient will benefit from skilled therapeutic intervention in order to improve the following deficits and impairments:  Abnormal gait, Pain, Improper body mechanics, Decreased mobility, Decreased activity tolerance, Decreased endurance, Decreased range of motion, Decreased strength, Hypomobility, Difficulty walking  Visit Diagnosis: Chronic bilateral low back pain, unspecified whether sciatica present  Muscle weakness (generalized)  Other symptoms and signs involving the musculoskeletal system  Chronic pain of right knee  Chronic pain of left knee     Problem List Patient Active Problem List   Diagnosis Date Noted  . Diverticulitis of colon 10/11/2017  . Crohn's colitis ([place]) 10/11/2017  . Fibromyalgia 07/14/2017  . ANA positive 07/14/2017  . Primary osteoarthritis of both knees 07/14/2017  . DDD (degenerative disc disease), lumbar 07/14/2017  . Hypermobility of joint 07/14/2017  . Age-related osteoporosis without current pathological fracture  07/14/2017  . Other insomnia 07/14/2017  . History of Crohn's disease 07/14/2017  . History of IBS 07/14/2017  . History of hypercholesterolemia 07/14/2017  . History of COPD 07/14/2017  . History of cholecystectomy 07/14/2017  . Community acquired pneumonia 12/13/2016  . Pneumonia 12/13/2016  . Leukocytosis 12/13/2016  . Hypokalemia 12/13/2016  . Diabetes mellitus without complication ([place]) 24/09/7351   Ihor Austin, LPTA/CLT; CBIS [phone]  Aldona Lento 05/29/2019, 7:05 PM  [place], [place], [address] Phone: [phone]   Fax:  [phone]  Name: Desiree Hogan MRN: [id number] Date of Birth: Jul 18, 1955

## 2019-06-05 ENCOUNTER — Encounter (HOSPITAL_COMMUNITY): Payer: Medicare Other

## 2019-06-10 ENCOUNTER — Encounter (HOSPITAL_COMMUNITY): Payer: Medicare Other | Admitting: Physical Therapy

## 2019-06-12 ENCOUNTER — Encounter (HOSPITAL_COMMUNITY): Payer: Self-pay | Admitting: Physical Therapy

## 2019-06-12 ENCOUNTER — Other Ambulatory Visit: Payer: Self-pay

## 2019-06-12 ENCOUNTER — Ambulatory Visit (HOSPITAL_COMMUNITY): Payer: Medicare Other | Attending: Physical Medicine & Rehabilitation | Admitting: Physical Therapy

## 2019-06-12 DIAGNOSIS — E039 Hypothyroidism, unspecified: Secondary | ICD-10-CM | POA: Diagnosis not present

## 2019-06-12 DIAGNOSIS — R29898 Other symptoms and signs involving the musculoskeletal system: Secondary | ICD-10-CM | POA: Insufficient documentation

## 2019-06-12 DIAGNOSIS — M545 Low back pain, unspecified: Secondary | ICD-10-CM

## 2019-06-12 DIAGNOSIS — Z299 Encounter for prophylactic measures, unspecified: Secondary | ICD-10-CM | POA: Diagnosis not present

## 2019-06-12 DIAGNOSIS — E1165 Type 2 diabetes mellitus with hyperglycemia: Secondary | ICD-10-CM | POA: Diagnosis not present

## 2019-06-12 DIAGNOSIS — J449 Chronic obstructive pulmonary disease, unspecified: Secondary | ICD-10-CM | POA: Diagnosis not present

## 2019-06-12 DIAGNOSIS — M6281 Muscle weakness (generalized): Secondary | ICD-10-CM | POA: Insufficient documentation

## 2019-06-12 DIAGNOSIS — I700 Atherosclerosis of aorta: Secondary | ICD-10-CM | POA: Diagnosis not present

## 2019-06-12 DIAGNOSIS — G8929 Other chronic pain: Secondary | ICD-10-CM | POA: Insufficient documentation

## 2019-06-12 DIAGNOSIS — M25562 Pain in left knee: Secondary | ICD-10-CM | POA: Diagnosis not present

## 2019-06-12 DIAGNOSIS — M25561 Pain in right knee: Secondary | ICD-10-CM | POA: Diagnosis not present

## 2019-06-12 NOTE — Therapy (Signed)
[place], [place], [address] Phone: [phone]   Fax:  [phone]  Physical Therapy Treatment  Patient Details  Name: Desiree Hogan MRN: [id number] Date of Birth: 01-05-1955 Referring Provider (PT): Letta Pate Luanna Salk, MD   Encounter Date: 06/12/2019  PT End of Session - 06/12/19 1623    Visit Number  4    Number of Visits  12    Date for PT Re-Evaluation  06/28/19    Authorization Type  UHC Medicare    Progress Note Due on Visit  10    PT Start Time  1605    PT Stop Time  1643    PT Time Calculation (min)  38 min    Activity Tolerance  Patient tolerated treatment well;No increased pain    Behavior During Therapy  WFL for tasks assessed/performed       Past Medical History:  Diagnosis Date  . Abdominal pain   . Anemia   . Back pain   . Colitis   . COPD (chronic obstructive pulmonary disease) (HCC)    not on home o2  . Crohn's disease ([place]) 03/29/12  . Diabetes mellitus without complication ([place])   . Fibromyalgia   . H/O breast biopsy   . Hip pain   . Hyperlipidemia   . Hypothyroidism   . Irritable bowel syndrome   . Joint pain   . Nonspecific abnormal finding in stool contents   . Peripheral vascular disease ([place])   . Ulcer    Mouth    Past Surgical History:  Procedure Laterality Date  . BIOPSY  12/20/2017   Procedure: BIOPSY;  Surgeon: Rogene Houston, MD;  [facility];  Service: Endoscopy;;  cecal erosions  . CESAREAN SECTION  06/24/82  . CHOLECYSTECTOMY  1993   Gall Bladder  . COLONOSCOPY  Jan. 31, 2014  . COLONOSCOPY N/A 12/20/2017   Procedure: COLONOSCOPY;  Surgeon: Rogene Houston, MD;  [facility];  Service: Endoscopy;  Laterality: N/A;  2:25  . [place] SURGERY  2010 and 2011  . TONSILLECTOMY      There were no vitals filed for this visit.  Subjective Assessment - 06/12/19 1607    Subjective  Patient reported that her knees are feeling pretty good  today. She stated that her back is bothering her most right now.    Pertinent History  Bilateral knee pain, back pain of insidious onset    Patient Stated Goals  Have less pain    Currently in Pain?  Yes    Pain Score  6     Pain Location  Back    Pain Orientation  Lower    Pain Descriptors / Indicators  Aching    Pain Type  Chronic pain    Pain Onset  More than a month ago    Pain Frequency  Constant                        [facility] Adult PT Treatment/Exercise - 06/12/19 0001      Knee/Hip Exercises: Stretches   Other Knee/Hip Stretches  LTR 10x10''     Other Knee/Hip Stretches  DKTC with towel 3x20''       Knee/Hip Exercises: Seated   Sit to Sand  1 set;10 reps;without UE support      Knee/Hip Exercises: Supine   Bridges  Strengthening;Both;15 reps    Straight Leg Raises  Right;Left;10 reps  Other Supine Knee/Hip Exercises  ab set paired with exhalation 3" holds; ab set with marches 10x 3"    Other Supine Knee/Hip Exercises  gluteal sets 15x bil LEs at 90/90 on small green ball and HS sets x 15      Knee/Hip Exercises: Sidelying   Clams  15x each side RTB with verbal and tactile cueing for form/mechanics               PT Short Term Goals - 05/27/19 1353      PT SHORT TERM GOAL #1   Title  Patient will report understanding and regular compliance with HEP to improve strength, decrease pain, and improve overall mobility.    Time  3    Period  Weeks    Status  On-going    Target Date  06/07/19      PT SHORT TERM GOAL #2   Title  Patient will report an improvement of at least 25% in overall subjective complaint for improved QoL.    Time  3    Period  Weeks    Status  On-going    Target Date  06/07/19        PT Long Term Goals - 05/27/19 1353      PT LONG TERM GOAL #1   Title  Patient will report an improvement of at least 50% in overall subjective complaint for improved QoL.    Time  6    Period  Weeks    Status  On-going      PT LONG  TERM GOAL #2   Title  Patient will demonstrate ability to ambulate an additional 50 feet on the 2MWT indicating improved gait velocity and improved community ambulation.    Time  6    Period  Weeks    Status  On-going      PT LONG TERM GOAL #3   Title  Patient will report ability to ambulate for at least 5 minutes with minimal to no increase in pain in order to perform household ambulation with improved ease.    Time  6    Period  Weeks    Status  On-going      PT LONG TERM GOAL #4   Title  Patient will demonstrate improvement of at least 10% on FOTO indicating improved functional mobility.    Time  6    Period  Weeks    Status  On-going      PT LONG TERM GOAL #5   Title  Additional goals to address back pain may be added in the future.            Plan - 06/12/19 1633    Clinical Impression Statement  Patient with reported more back pain than knee pain this session, so focused more on low back this session. Added DKTC this session with towel. Patient with limited ROM with this hip flexion to about 90 degrees bilaterally. Also added supine exercises with lower extremities in 90/90 position for lower back. Patient reported an improvement in comfort in the 90/90 position. Plan to continue addressing patient's LBP as needed.    Personal Factors and Comorbidities  Age;Comorbidity 3+    Comorbidities  Fibromyalgia, Crohn's disease, DM    Examination-Activity Limitations  Stand;Locomotion Level;Transfers;Squat;Stairs    Examination-Participation Restrictions  Cleaning;Meal Prep;Community Activity;Laundry;Shop    Stability/Clinical Decision Making  Evolving/Moderate complexity    Rehab Potential  Fair    PT Frequency  2x / week  PT Duration  6 weeks    PT Treatment/Interventions  ADLs/Self Care Home Management;Aquatic Therapy;Cryotherapy;Electrical Stimulation;Moist Heat;DME Instruction;Gait training;Stair training;Functional mobility training;Therapeutic activities;Therapeutic  exercise;Balance training;Neuromuscular re-education;Patient/family education;Orthotic Fit/Training;Manual techniques;Passive range of motion;Dry needling;Energy conservation;Taping    PT Next Visit Plan  Determine if patient is having more knee or back pain and tailor session to that. Begin controlled STS.  Progress hip strengthening as able.  Manual therapy for pain relief gentle tib/fem jt distraction. Focus on Knee pain initially re-assess for back pain in future sessions PRN.    PT Home Exercise Plan  05/17/19: Quad sets; 05/29/19: heel slides and clam.       Patient will benefit from skilled therapeutic intervention in order to improve the following deficits and impairments:  Abnormal gait, Pain, Improper body mechanics, Decreased mobility, Decreased activity tolerance, Decreased endurance, Decreased range of motion, Decreased strength, Hypomobility, Difficulty walking  Visit Diagnosis: Chronic bilateral low back pain, unspecified whether sciatica present  Muscle weakness (generalized)  Other symptoms and signs involving the musculoskeletal system  Chronic pain of right knee  Chronic pain of left knee     Problem List Patient Active Problem List   Diagnosis Date Noted  . Diverticulitis of colon 10/11/2017  . Crohn's colitis ([place]) 10/11/2017  . Fibromyalgia 07/14/2017  . ANA positive 07/14/2017  . Primary osteoarthritis of both knees 07/14/2017  . DDD (degenerative disc disease), lumbar 07/14/2017  . Hypermobility of joint 07/14/2017  . Age-related osteoporosis without current pathological fracture 07/14/2017  . Other insomnia 07/14/2017  . History of Crohn's disease 07/14/2017  . History of IBS 07/14/2017  . History of hypercholesterolemia 07/14/2017  . History of COPD 07/14/2017  . History of cholecystectomy 07/14/2017  . Community acquired pneumonia 12/13/2016  . Pneumonia 12/13/2016  . Leukocytosis 12/13/2016  . Hypokalemia 12/13/2016  . Diabetes mellitus without  complication ([place]) 33/54/5625   Clarene Critchley PT, DPT 4:46 PM, 06/12/19 [place], [place], [address] Phone: [phone]   Fax:  [phone]  Name: Desiree Hogan MRN: [id number] Date of Birth: June 17, 1955

## 2019-06-18 ENCOUNTER — Ambulatory Visit
Admission: RE | Admit: 2019-06-18 | Discharge: 2019-06-18 | Disposition: A | Discharge status: Home / Self Care | Payer: Medicare Other | Source: Ambulatory Visit | Attending: Physical Medicine & Rehabilitation | Admitting: Physical Medicine & Rehabilitation

## 2019-06-18 ENCOUNTER — Encounter: Payer: Medicare Other | Attending: Physical Medicine & Rehabilitation | Admitting: Physical Medicine & Rehabilitation

## 2019-06-18 ENCOUNTER — Other Ambulatory Visit: Payer: Self-pay

## 2019-06-18 ENCOUNTER — Encounter: Payer: Self-pay | Admitting: Physical Medicine & Rehabilitation

## 2019-06-18 VITALS — BP 158/85 | HR 84 | Ht 64.0 in | Wt 193.0 lb

## 2019-06-18 DIAGNOSIS — M7552 Bursitis of left shoulder: Secondary | ICD-10-CM

## 2019-06-18 DIAGNOSIS — Z5181 Encounter for therapeutic drug level monitoring: Secondary | ICD-10-CM | POA: Diagnosis not present

## 2019-06-18 DIAGNOSIS — M797 Fibromyalgia: Secondary | ICD-10-CM | POA: Insufficient documentation

## 2019-06-18 DIAGNOSIS — M1711 Unilateral primary osteoarthritis, right knee: Secondary | ICD-10-CM | POA: Diagnosis not present

## 2019-06-18 DIAGNOSIS — M1712 Unilateral primary osteoarthritis, left knee: Secondary | ICD-10-CM | POA: Insufficient documentation

## 2019-06-18 DIAGNOSIS — G894 Chronic pain syndrome: Secondary | ICD-10-CM | POA: Diagnosis not present

## 2019-06-18 DIAGNOSIS — Z79891 Long term (current) use of opiate analgesic: Secondary | ICD-10-CM | POA: Diagnosis not present

## 2019-06-18 DIAGNOSIS — M25512 Pain in left shoulder: Secondary | ICD-10-CM | POA: Diagnosis not present

## 2019-06-18 DIAGNOSIS — M533 Sacrococcygeal disorders, not elsewhere classified: Secondary | ICD-10-CM | POA: Diagnosis not present

## 2019-06-18 NOTE — Patient Instructions (Signed)

## 2019-06-18 NOTE — Progress Notes (Signed)
Subjective:    Patient ID: Desiree Hogan, female    DOB: February 27, 1955, 64 y.o.   MRN: [id number]  HPI Cc:  Left shoulder  64 year old female with history of fibromyalgia is here with acute left shoulder pain Has chronic bilateral shoulder pain.  Left shoulder more painful over the last several days  Feels like "back of the shoulder "  Started mid day a few days ago, No fall or trauma or new repetitive activity.  Patient has not had a history of shoulder surgery.  She does have a history of osteoporosis but no recent falls or near falls. No new numbness or tingling in the hand there is some left-sided neck pain Pain Inventory Average Pain 7 Pain Right Now 5 My pain is aching  In the last 24 hours, has pain interfered with the following? General activity 1 Relation with others 1 Enjoyment of life 1 What TIME of day is your pain at its worst? evening Sleep (in general) Fair  Pain is worse with: some activites Pain improves with: rest and medication Relief from Meds: 4  Mobility how many minutes can you walk? 10 ability to climb steps?  yes do you drive?  yes  Function retired  Neuro/Psych No problems in this area  Prior Studies Any changes since last visit?  no  Physicians involved in your care Any changes since last visit?  no   Family History  Problem Relation Age of Onset  . Cancer Mother        Colon or vaginal ?  . Deep vein thrombosis Mother   . Hypertension Father   . Diabetes Father   . Heart disease Father        Heart Disease before age 22  . Diabetes Sister   . Diabetes Brother   . Hypertension Brother   . Healthy Son   . Healthy Daughter   . Healthy Daughter   . Healthy Daughter    Social History   Socioeconomic History  . Marital status: Married    Spouse name: Not on file  . Number of children: Not on file  . Years of education: Not on file  . Highest education level: Not on file  Occupational History  . Not on file  Tobacco Use  .  Smoking status: Former Smoker    Packs/day: 0.75    Years: 20.00    Pack years: 15.00    Types: Cigarettes    Quit date: 01/03/2009    Years since quitting: 10.4  . Smokeless tobacco: Never Used  Vaping Use  . Vaping Use: Never used  Substance and Sexual Activity  . Alcohol use: No  . Drug use: No  . Sexual activity: Not on file  Other Topics Concern  . Not on file  Social History Narrative  . Not on file   Social Determinants of Health   Financial Resource Strain:   . Difficulty of Paying Living Expenses:   Food Insecurity:   . Worried About Charity fundraiser in the Last Year:   . Arboriculturist in the Last Year:   Transportation Needs:   . Film/video editor (Medical):   Marland Kitchen Lack of Transportation (Non-Medical):   Physical Activity:   . Days of Exercise per Week:   . Minutes of Exercise per Session:   Stress:   . Feeling of Stress :   Social Connections:   . Frequency of Communication with Friends and Family:   . Frequency  of Social Gatherings with Friends and Family:   . Attends Religious Services:   . Active Member of Clubs or Organizations:   . Attends Archivist Meetings:   Marland Kitchen Marital Status:    Past Surgical History:  Procedure Laterality Date  . BIOPSY  12/20/2017   Procedure: BIOPSY;  Surgeon: Rogene Houston, MD;  [facility];  Service: Endoscopy;;  cecal erosions  . CESAREAN SECTION  06/24/82  . CHOLECYSTECTOMY  1993   Gall Bladder  . COLONOSCOPY  Jan. 31, 2014  . COLONOSCOPY N/A 12/20/2017   Procedure: COLONOSCOPY;  Surgeon: Rogene Houston, MD;  [facility];  Service: Endoscopy;  Laterality: N/A;  2:25  . [place] SURGERY  2010 and 2011  . TONSILLECTOMY     Past Medical History:  Diagnosis Date  . Abdominal pain   . Anemia   . Back pain   . Colitis   . COPD (chronic obstructive pulmonary disease) (HCC)    not on home o2  . Crohn's disease ([place]) 03/29/12  . Diabetes mellitus without complication ([place])   .  Fibromyalgia   . H/O breast biopsy   . Hip pain   . Hyperlipidemia   . Hypothyroidism   . Irritable bowel syndrome   . Joint pain   . Nonspecific abnormal finding in stool contents   . Peripheral vascular disease ([place])   . Ulcer    Mouth   BP (!) 158/85   Pulse 84   Ht 5' 4"  (1.626 m)   Wt 193 lb (87.5 kg)   SpO2 92%   BMI 33.13 kg/m   Opioid Risk Score:   Fall Risk Score:  `1  Depression screen PHQ 2/9  Depression screen PHQ 2/9 03/26/2019  Decreased Interest 2  Down, Depressed, Hopeless 1  PHQ - 2 Score 3  Altered sleeping 2  Tired, decreased energy 2  Change in appetite 0  Feeling bad or failure about yourself  0  Trouble concentrating 2  Moving slowly or fidgety/restless 1  Suicidal thoughts 0  PHQ-9 Score 10    Review of Systems  Constitutional: Positive for diaphoresis.  Musculoskeletal: Positive for arthralgias, back pain and gait problem.  All other systems reviewed and are negative.      Objective:   Physical Exam Vitals and nursing note reviewed.  Constitutional:      Appearance: She is obese.  Eyes:     Extraocular Movements: Extraocular movements intact.     Conjunctiva/sclera: Conjunctivae normal.     Pupils: Pupils are equal, round, and reactive to light.  Musculoskeletal:     Left shoulder: Tenderness present. No swelling, deformity, effusion, bony tenderness or crepitus. Decreased range of motion. Decreased strength.  Neurological:     General: No focal deficit present.     Mental Status: She is alert and oriented to person, place, and time.     Coordination: Coordination is intact.     Gait: Gait is intact.     Comments: Left bicep tricep strength normal grip strength is normal Deltoid deferred secondary to pain during activity. Sensation normal in the left upper extremity light touch C5-C6-C7 C8 dermatomal distribution  Psychiatric:        Mood and Affect: Mood normal.        Behavior: Behavior normal.     + impingement sign left  shoulder Left subacromial pain  Left upper trap pain       Assessment & Plan:   1.  Left shoulder pain fairly acute onset but no trauma, pain limits movement and sleep.  Not a good candidate for NSAIDs given Crohn's disease (told by other MD not to take).  Has Hx of osteoporosis on Fosamax so would not use oral corticosteroids.  Will inject subacromial bursa Check x-ray to see if there is underlying osteoarthritis  Shoulder injectionLeft subacromial Without ultrasound guidance)  Indication:Left Shoulder pain not relieved by medication management and other conservative care.  Informed consent was obtained after describing risks and benefits of the procedure with the patient, this includes bleeding, bruising, infection and medication side effects. The patient wishes to proceed and has given written consent. Patient was placed in a seated position. TheLeft shoulder was marked and prepped with betadine in the subacromial area. A 25-gauge 1-1/2 inch needle was inserted into the subacromial area. After negative draw back for blood, a solution containing 1 mL of 6 mg per ML betamethasone and 4 mL of 1% lidocaine was injected. A band aid was applied. The patient tolerated the procedure well. Post procedure instructions were given.

## 2019-06-19 ENCOUNTER — Ambulatory Visit (HOSPITAL_COMMUNITY): Payer: Medicare Other | Admitting: Physical Therapy

## 2019-06-19 ENCOUNTER — Telehealth (HOSPITAL_COMMUNITY): Payer: Self-pay | Admitting: Physical Therapy

## 2019-06-19 NOTE — Telephone Encounter (Signed)
pt cancelled appt for today because she is not feeling well

## 2019-06-24 ENCOUNTER — Telehealth (HOSPITAL_COMMUNITY): Payer: Self-pay | Admitting: Physical Therapy

## 2019-06-24 ENCOUNTER — Ambulatory Visit (HOSPITAL_COMMUNITY): Payer: Medicare Other | Admitting: Physical Therapy

## 2019-06-24 NOTE — Telephone Encounter (Signed)
pt cancelled appt for today, no reason given

## 2019-06-26 ENCOUNTER — Ambulatory Visit (HOSPITAL_COMMUNITY): Payer: Medicare Other | Admitting: Physical Therapy

## 2019-06-26 ENCOUNTER — Telehealth (HOSPITAL_COMMUNITY): Payer: Self-pay | Admitting: Physical Therapy

## 2019-06-26 NOTE — Telephone Encounter (Signed)
L/m to cx during lunch no reason given

## 2019-07-14 ENCOUNTER — Other Ambulatory Visit: Payer: Self-pay | Admitting: Rheumatology

## 2019-07-15 NOTE — Telephone Encounter (Signed)
Last Visit: 03/04/2019  Next Visit: 09/05/2019  Current Dose per office note on 03/04/2019: dose not specified.  DX: Fibromyalgia   (patient is also currently seeing pain management)   Okay to refill cymbalta?

## 2019-07-20 ENCOUNTER — Other Ambulatory Visit: Payer: Self-pay | Admitting: Physical Medicine & Rehabilitation

## 2019-07-23 ENCOUNTER — Encounter: Payer: Medicare Other | Attending: Physical Medicine & Rehabilitation | Admitting: Physical Medicine & Rehabilitation

## 2019-07-23 ENCOUNTER — Encounter: Payer: Self-pay | Admitting: Physical Medicine & Rehabilitation

## 2019-07-23 ENCOUNTER — Other Ambulatory Visit: Payer: Self-pay

## 2019-07-23 DIAGNOSIS — M19012 Primary osteoarthritis, left shoulder: Secondary | ICD-10-CM

## 2019-07-23 DIAGNOSIS — M7552 Bursitis of left shoulder: Secondary | ICD-10-CM | POA: Diagnosis not present

## 2019-07-23 DIAGNOSIS — G894 Chronic pain syndrome: Secondary | ICD-10-CM | POA: Diagnosis not present

## 2019-07-23 DIAGNOSIS — Z79891 Long term (current) use of opiate analgesic: Secondary | ICD-10-CM | POA: Diagnosis not present

## 2019-07-23 DIAGNOSIS — M797 Fibromyalgia: Secondary | ICD-10-CM | POA: Diagnosis not present

## 2019-07-23 DIAGNOSIS — M1712 Unilateral primary osteoarthritis, left knee: Secondary | ICD-10-CM | POA: Diagnosis not present

## 2019-07-23 DIAGNOSIS — Z5181 Encounter for therapeutic drug level monitoring: Secondary | ICD-10-CM | POA: Insufficient documentation

## 2019-07-23 DIAGNOSIS — M533 Sacrococcygeal disorders, not elsewhere classified: Secondary | ICD-10-CM | POA: Insufficient documentation

## 2019-07-23 DIAGNOSIS — M1711 Unilateral primary osteoarthritis, right knee: Secondary | ICD-10-CM | POA: Diagnosis not present

## 2019-07-23 NOTE — Progress Notes (Signed)
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 Pain interferes with ADLs and sleep  The patient was placed in a seated position the acromioclavicular joint was scanned in the long axis view, areas marked prepped with Betadine, sterile technique was utilized. Under direct long axis view 1% lidocaine was infiltrated to the skin and subcutaneous tissues. A 25-gauge 1.5 inch needle reached the acromioclavicular joint space. Then a solution containing 1 mL of lidocaine 1% and 0.5 ML of Celestone 6 mg per mL was injected. Patient tolerated procedure well. Images were saved. Postprocedure instructions given

## 2019-07-23 NOTE — Patient Instructions (Signed)
Left acromio-clavicular joint injection for osteoarthritis, this can be repeated every 3 mo if helpful.

## 2019-08-02 DIAGNOSIS — J449 Chronic obstructive pulmonary disease, unspecified: Secondary | ICD-10-CM | POA: Diagnosis not present

## 2019-08-02 DIAGNOSIS — E559 Vitamin D deficiency, unspecified: Secondary | ICD-10-CM | POA: Diagnosis not present

## 2019-08-02 DIAGNOSIS — Z720 Tobacco use: Secondary | ICD-10-CM | POA: Diagnosis not present

## 2019-08-02 DIAGNOSIS — E1165 Type 2 diabetes mellitus with hyperglycemia: Secondary | ICD-10-CM | POA: Diagnosis not present

## 2019-08-13 ENCOUNTER — Other Ambulatory Visit: Payer: Self-pay | Admitting: Rheumatology

## 2019-08-13 NOTE — Telephone Encounter (Signed)
Last Visit: 03/04/2019  Next Visit: 09/05/2019  Last Fill: 05/20/2019  Okay to refill Tizanidine?

## 2019-08-23 NOTE — Progress Notes (Signed)
Office Visit Note  Patient: Desiree Hogan             Date of Birth: 01-12-1955           MRN: [id number]             PCP: Glenda Chroman, MD Referring: Glenda Chroman, MD Visit Date: 09/05/2019 Occupation: @GUAROCC @  Subjective:  Lower back and generalized pain.   History of Present Illness: Desiree Hogan is a 64 y.o. female with history of fibromyalgia and osteoarthritis.  She states she still continues to have some lower back discomfort and knee joint discomfort.  She has been on tramadol 50 mg 3 times a day as needed by pain management which has been helpful.  She also takes Lyrica 50 mg twice a day.  She has been requiring muscle relaxer at bedtime.  The Cymbalta 30 mg a day has been helpful to manage pain as well.  He continues to have some generalized pain from fibromyalgia.  Her trapezius areas are still painful.  Activities of Daily Living:  Patient reports morning stiffness for 30-45 minutes.   Patient Reports nocturnal pain.  Difficulty dressing/grooming: Denies Difficulty climbing stairs: Denies Difficulty getting out of chair: Denies Difficulty using hands for taps, buttons, cutlery, and/or writing: Reports  Review of Systems  Constitutional: Negative for fatigue.  HENT: Positive for mouth sores. Negative for mouth dryness and nose dryness.   Eyes: Negative for itching and dryness.  Respiratory: Negative for shortness of breath and difficulty breathing.   Cardiovascular: Negative for chest pain and palpitations.  Gastrointestinal: Negative for blood in stool, constipation and diarrhea.  Endocrine: Negative for increased urination.  Genitourinary: Negative for difficulty urinating.  Musculoskeletal: Positive for arthralgias, joint pain, myalgias, morning stiffness, muscle tenderness and myalgias. Negative for joint swelling.  Skin: Negative for color change, rash and redness.  Allergic/Immunologic: Negative for susceptible to infections.  Neurological: Negative for  dizziness, numbness, headaches, memory loss and weakness.  Hematological: Positive for bruising/bleeding tendency.  Psychiatric/Behavioral: Negative for confusion.    PMFS History:  Patient Active Problem List   Diagnosis Date Noted  . Diverticulitis of colon 10/11/2017  . Crohn's colitis ([place]) 10/11/2017  . Fibromyalgia 07/14/2017  . ANA positive 07/14/2017  . Primary osteoarthritis of both knees 07/14/2017  . DDD (degenerative disc disease), lumbar 07/14/2017  . Hypermobility of joint 07/14/2017  . Age-related osteoporosis without current pathological fracture 07/14/2017  . Other insomnia 07/14/2017  . History of Crohn's disease 07/14/2017  . History of IBS 07/14/2017  . History of hypercholesterolemia 07/14/2017  . History of COPD 07/14/2017  . History of cholecystectomy 07/14/2017  . Community acquired pneumonia 12/13/2016  . Pneumonia 12/13/2016  . Leukocytosis 12/13/2016  . Hypokalemia 12/13/2016  . Diabetes mellitus without complication ([place]) 68/03/2120    Past Medical History:  Diagnosis Date  . Abdominal pain   . Anemia   . Back pain   . Colitis   . COPD (chronic obstructive pulmonary disease) (HCC)    not on home o2  . Crohn's disease ([place]) 03/29/12  . Diabetes mellitus without complication ([place])   . Fibromyalgia   . H/O breast biopsy   . Hip pain   . Hyperlipidemia   . Hypothyroidism   . Irritable bowel syndrome   . Joint pain   . Nonspecific abnormal finding in stool contents   . Peripheral vascular disease ([place])   . Ulcer    Mouth    Family History  Problem  Relation Age of Onset  . Cancer Mother        Colon or vaginal ?  . Deep vein thrombosis Mother   . Hypertension Father   . Diabetes Father   . Heart disease Father        Heart Disease before age 26  . Diabetes Sister   . Diabetes Brother   . Hypertension Brother   . Healthy Son   . Healthy Daughter   . Healthy Daughter   . Healthy Daughter    Past Surgical History:  Procedure  Laterality Date  . BIOPSY  12/20/2017   Procedure: BIOPSY;  Surgeon: Rogene Houston, MD;  [facility];  Service: Endoscopy;;  cecal erosions  . CESAREAN SECTION  06/24/82  . CHOLECYSTECTOMY  1993   Gall Bladder  . COLONOSCOPY  Jan. 31, 2014  . COLONOSCOPY N/A 12/20/2017   Procedure: COLONOSCOPY;  Surgeon: Rogene Houston, MD;  Location: [facility];  Service: Endoscopy;  Laterality: N/A;  2:25  . [place] SURGERY  2010 and 2011  . TONSILLECTOMY     Social History   Social History Narrative  . Not on file    There is no immunization history on file for this patient.   Objective: Vital Signs: BP (!) 165/95 (BP Location: Left Arm, Patient Position: Sitting, Cuff Size: Normal)   Pulse 88   Resp 16   Ht 5' 3"  (1.6 m)   Wt 192 lb 9.6 oz (87.4 kg)   BMI 34.12 kg/m    Physical Exam Vitals and nursing note reviewed.  Constitutional:      Appearance: She is well-developed.  HENT:     Head: Normocephalic and atraumatic.  Eyes:     Conjunctiva/sclera: Conjunctivae normal.  Cardiovascular:     Rate and Rhythm: Normal rate and regular rhythm.     Heart sounds: Normal heart sounds.  Pulmonary:     Effort: Pulmonary effort is normal.     Breath sounds: Normal breath sounds.  Abdominal:     General: Bowel sounds are normal.     Palpations: Abdomen is soft.  Musculoskeletal:     Cervical back: Normal range of motion.  Lymphadenopathy:     Cervical: No cervical adenopathy.  Skin:    General: Skin is warm and dry.     Capillary Refill: Capillary refill takes less than 2 seconds.  Neurological:     Mental Status: She is alert and oriented to person, place, and time.  Psychiatric:        Behavior: Behavior normal.      Musculoskeletal Exam: C-spine thoracic and lumbar spine were limited range of motion with discomfort.  Shoulder joints, elbow joints, wrist joints, MCPs PIPs and DIPs with good range of motion with no synovitis.  Hip joints, knee joints, ankles and  MTPs been range of motion with no synovitis.  She had generalized hyperalgesia and positive tender points.  She had bilateral trapezius spasm.  Tenderness was noted over trochanteric bursa.  CDAI Exam: CDAI Score: -- Patient Global: --; Provider Global: -- Swollen: --; Tender: -- Joint Exam 09/05/2019   No joint exam has been documented for this visit   There is currently no information documented on the homunculus. Go to the Rheumatology activity and complete the homunculus joint exam.  Investigation: No additional findings.  Imaging: No results found.  Recent Labs: Lab Results  Component Value Date   WBC 6.7 10/11/2017   HGB 12.2 10/11/2017   PLT 234  10/11/2017   NA 141 09/01/2017   K 3.9 09/01/2017   CL 107 09/01/2017   CO2 26 09/01/2017   GLUCOSE 120 (H) 09/01/2017   BUN 14 09/01/2017   CREATININE 0.79 09/01/2017   BILITOT 0.6 09/01/2017   ALKPHOS 64 09/01/2017   AST 28 09/01/2017   ALT 22 09/01/2017   PROT 7.5 09/01/2017   ALBUMIN 4.1 09/01/2017   CALCIUM 9.4 09/01/2017   GFRAA >60 09/01/2017    Speciality Comments: No specialty comments available.  Procedures:  No procedures performed Allergies: Bactrim [sulfamethoxazole-trimethoprim], Penicillins, and Tetracyclines & related   Assessment / Plan:     Visit Diagnoses: Fibromyalgia -he is on tizanidine 4 mg p.o. nightly, Cymbalta 30 mg p.o. daily, and on tramadol, and Lyrica by pain management.  She states she still continues to have some generalized pain and discomfort but is manageable.  Medication monitoring encounter - Tramadol- prescribed by pain management.   Trapezius muscle spasm-we discussed some neck stretching exercises.  I would avoid cortisone injection today.  ANA positive -  +RF, -CCP.  She had no clinical features of autoimmune disease.  She had no synovitis on my examination.  Primary osteoarthritis of both knees-she continues to have some ongoing discomfort in her knee joints.  Weight loss  diet and exercise was emphasized.  DDD (degenerative disc disease), lumbar-she has chronic lower back pain.  Core strengthening will be helpful.  Hypermobility of joint  Age-related osteoporosis without current pathological fracture - Fosamax 70 mg po once weekly.  Managed by her PCP.  Other insomnia  History of hypercholesterolemia  History of COPD  History of Crohn's disease  History of cholecystectomy  History of IBS   COVID-19 vaccination-patient has not received COVID-19 vaccination.  She is hesitant to get the vaccination.  I detailed discussion regarding the benefits of vaccine with the patient.  Use of mask, social distancing and hand hygiene was discussed.  Orders: No orders of the defined types were placed in this encounter.  No orders of the defined types were placed in this encounter.   Follow-Up Instructions: Return in about 6 months (around 03/04/2020) for FMS, OA.   Bo Merino, MD  Note - This record has been created using Editor, commissioning.  Chart creation errors have been sought, but may not always  have been located. Such creation errors do not reflect on  the standard of medical care.

## 2019-09-03 DIAGNOSIS — E559 Vitamin D deficiency, unspecified: Secondary | ICD-10-CM | POA: Diagnosis not present

## 2019-09-03 DIAGNOSIS — Z720 Tobacco use: Secondary | ICD-10-CM | POA: Diagnosis not present

## 2019-09-03 DIAGNOSIS — E1165 Type 2 diabetes mellitus with hyperglycemia: Secondary | ICD-10-CM | POA: Diagnosis not present

## 2019-09-03 DIAGNOSIS — J449 Chronic obstructive pulmonary disease, unspecified: Secondary | ICD-10-CM | POA: Diagnosis not present

## 2019-09-05 ENCOUNTER — Ambulatory Visit: Payer: Medicare Other | Admitting: Rheumatology

## 2019-09-05 ENCOUNTER — Encounter: Payer: Self-pay | Admitting: Rheumatology

## 2019-09-05 ENCOUNTER — Other Ambulatory Visit: Payer: Self-pay

## 2019-09-05 VITALS — BP 165/95 | HR 88 | Resp 16 | Ht 63.0 in | Wt 192.6 lb

## 2019-09-05 DIAGNOSIS — Z8719 Personal history of other diseases of the digestive system: Secondary | ICD-10-CM

## 2019-09-05 DIAGNOSIS — M797 Fibromyalgia: Secondary | ICD-10-CM

## 2019-09-05 DIAGNOSIS — M62838 Other muscle spasm: Secondary | ICD-10-CM | POA: Diagnosis not present

## 2019-09-05 DIAGNOSIS — R768 Other specified abnormal immunological findings in serum: Secondary | ICD-10-CM

## 2019-09-05 DIAGNOSIS — M249 Joint derangement, unspecified: Secondary | ICD-10-CM

## 2019-09-05 DIAGNOSIS — M810 Age-related osteoporosis without current pathological fracture: Secondary | ICD-10-CM

## 2019-09-05 DIAGNOSIS — Z5181 Encounter for therapeutic drug level monitoring: Secondary | ICD-10-CM

## 2019-09-05 DIAGNOSIS — Z8709 Personal history of other diseases of the respiratory system: Secondary | ICD-10-CM

## 2019-09-05 DIAGNOSIS — M170 Bilateral primary osteoarthritis of knee: Secondary | ICD-10-CM | POA: Diagnosis not present

## 2019-09-05 DIAGNOSIS — M5136 Other intervertebral disc degeneration, lumbar region: Secondary | ICD-10-CM

## 2019-09-05 DIAGNOSIS — G4709 Other insomnia: Secondary | ICD-10-CM

## 2019-09-05 DIAGNOSIS — M51369 Other intervertebral disc degeneration, lumbar region without mention of lumbar back pain or lower extremity pain: Secondary | ICD-10-CM

## 2019-09-05 DIAGNOSIS — Z8639 Personal history of other endocrine, nutritional and metabolic disease: Secondary | ICD-10-CM

## 2019-09-05 DIAGNOSIS — Z9049 Acquired absence of other specified parts of digestive tract: Secondary | ICD-10-CM

## 2019-09-18 DIAGNOSIS — E039 Hypothyroidism, unspecified: Secondary | ICD-10-CM | POA: Diagnosis not present

## 2019-09-18 DIAGNOSIS — J449 Chronic obstructive pulmonary disease, unspecified: Secondary | ICD-10-CM | POA: Diagnosis not present

## 2019-09-18 DIAGNOSIS — E1165 Type 2 diabetes mellitus with hyperglycemia: Secondary | ICD-10-CM | POA: Diagnosis not present

## 2019-09-18 DIAGNOSIS — Z299 Encounter for prophylactic measures, unspecified: Secondary | ICD-10-CM | POA: Diagnosis not present

## 2019-09-18 DIAGNOSIS — I700 Atherosclerosis of aorta: Secondary | ICD-10-CM | POA: Diagnosis not present

## 2019-09-24 DIAGNOSIS — I700 Atherosclerosis of aorta: Secondary | ICD-10-CM | POA: Diagnosis not present

## 2019-09-24 DIAGNOSIS — Z299 Encounter for prophylactic measures, unspecified: Secondary | ICD-10-CM | POA: Diagnosis not present

## 2019-09-24 DIAGNOSIS — E1165 Type 2 diabetes mellitus with hyperglycemia: Secondary | ICD-10-CM | POA: Diagnosis not present

## 2019-09-24 DIAGNOSIS — E039 Hypothyroidism, unspecified: Secondary | ICD-10-CM | POA: Diagnosis not present

## 2019-09-24 DIAGNOSIS — J449 Chronic obstructive pulmonary disease, unspecified: Secondary | ICD-10-CM | POA: Diagnosis not present

## 2019-10-05 ENCOUNTER — Other Ambulatory Visit: Payer: Self-pay | Admitting: Physician Assistant

## 2019-10-07 ENCOUNTER — Other Ambulatory Visit: Payer: Self-pay | Admitting: Physical Medicine & Rehabilitation

## 2019-10-07 NOTE — Telephone Encounter (Signed)
Last Visit: 09/05/2019 Next Visit: 03/05/2020  Okay to refill per Dr. Estanislado Pandy

## 2019-10-08 ENCOUNTER — Encounter (HOSPITAL_COMMUNITY): Payer: Self-pay | Admitting: Physical Therapy

## 2019-10-08 NOTE — Therapy (Signed)
[place], [place], [address] Phone: [phone]   Fax:  [phone]  Patient Details  Name: Desiree Hogan MRN: [id number] Date of Birth: 02/20/1955 Referring Provider:  No ref. provider found  Encounter Date: 10/08/2019   PHYSICAL THERAPY DISCHARGE SUMMARY  Visits from Start of Care: 4  Current functional level related to goals / functional outcomes: Unknown as patient did not return to physical therapy   Remaining deficits: Unknown as patient did not return to physical therapy   Education / Equipment: HEP Plan: Patient agrees to discharge.  Patient goals were not met. Patient is being discharged due to not returning since the last visit.  ?????      Clarene Critchley PT, DPT 10:29 AM, 10/08/19 [facility] [address] [place], [place], [address] Phone: [phone]   Fax:  [phone]

## 2019-10-23 ENCOUNTER — Other Ambulatory Visit: Payer: Self-pay | Admitting: Physical Medicine & Rehabilitation

## 2019-10-24 ENCOUNTER — Encounter: Payer: Self-pay | Admitting: Physical Medicine & Rehabilitation

## 2019-10-24 ENCOUNTER — Other Ambulatory Visit: Payer: Self-pay

## 2019-10-24 ENCOUNTER — Encounter: Payer: Medicare Other | Attending: Physical Medicine & Rehabilitation | Admitting: Physical Medicine & Rehabilitation

## 2019-10-24 VITALS — BP 147/73 | HR 83 | Temp 98.3°F | Ht 63.0 in | Wt 193.2 lb

## 2019-10-24 DIAGNOSIS — M19012 Primary osteoarthritis, left shoulder: Secondary | ICD-10-CM | POA: Insufficient documentation

## 2019-11-01 DIAGNOSIS — J449 Chronic obstructive pulmonary disease, unspecified: Secondary | ICD-10-CM | POA: Diagnosis not present

## 2019-11-01 DIAGNOSIS — Z720 Tobacco use: Secondary | ICD-10-CM | POA: Diagnosis not present

## 2019-11-01 DIAGNOSIS — E559 Vitamin D deficiency, unspecified: Secondary | ICD-10-CM | POA: Diagnosis not present

## 2019-11-01 DIAGNOSIS — E1165 Type 2 diabetes mellitus with hyperglycemia: Secondary | ICD-10-CM | POA: Diagnosis not present

## 2019-11-06 ENCOUNTER — Other Ambulatory Visit: Payer: Self-pay | Admitting: Physical Medicine & Rehabilitation

## 2019-11-10 ENCOUNTER — Other Ambulatory Visit: Payer: Self-pay | Admitting: Rheumatology

## 2019-11-11 NOTE — Telephone Encounter (Signed)
Last Visit: 09/05/2019 Next Visit: 03/05/2020  Last Fill: 08/13/2019  Okay to refill Tizanidine?

## 2019-11-14 ENCOUNTER — Other Ambulatory Visit (INDEPENDENT_AMBULATORY_CARE_PROVIDER_SITE_OTHER): Payer: Self-pay | Admitting: Internal Medicine

## 2019-12-12 ENCOUNTER — Other Ambulatory Visit: Payer: Self-pay

## 2019-12-12 NOTE — Telephone Encounter (Signed)
PMP Report: Tramadol - Last filled on 11/08/2019 for #90. Last written 11/08/2019.

## 2019-12-13 ENCOUNTER — Other Ambulatory Visit: Payer: Self-pay | Admitting: Physical Medicine & Rehabilitation

## 2019-12-13 MED ORDER — TRAMADOL HCL 50 MG PO TABS: 50.0000 mg | ORAL_TABLET | Freq: Three times a day (TID) | ORAL | 0 refills | Status: DC | PRN

## 2019-12-24 DIAGNOSIS — J449 Chronic obstructive pulmonary disease, unspecified: Secondary | ICD-10-CM | POA: Diagnosis not present

## 2019-12-24 DIAGNOSIS — J329 Chronic sinusitis, unspecified: Secondary | ICD-10-CM | POA: Diagnosis not present

## 2019-12-24 DIAGNOSIS — I700 Atherosclerosis of aorta: Secondary | ICD-10-CM | POA: Diagnosis not present

## 2019-12-24 DIAGNOSIS — E039 Hypothyroidism, unspecified: Secondary | ICD-10-CM | POA: Diagnosis not present

## 2019-12-24 DIAGNOSIS — Z299 Encounter for prophylactic measures, unspecified: Secondary | ICD-10-CM | POA: Diagnosis not present

## 2019-12-26 ENCOUNTER — Encounter (HOSPITAL_COMMUNITY): Payer: Self-pay | Admitting: Emergency Medicine

## 2019-12-26 ENCOUNTER — Emergency Department (HOSPITAL_COMMUNITY)
Admission: EM | Admit: 2019-12-26 | Discharge: 2019-12-26 | Disposition: A | Discharge status: Home / Self Care | Payer: Medicare Other | Attending: Emergency Medicine | Admitting: Emergency Medicine

## 2019-12-26 ENCOUNTER — Emergency Department (HOSPITAL_COMMUNITY): Payer: Medicare Other

## 2019-12-26 ENCOUNTER — Other Ambulatory Visit: Payer: Self-pay

## 2019-12-26 DIAGNOSIS — Z87891 Personal history of nicotine dependence: Secondary | ICD-10-CM | POA: Diagnosis not present

## 2019-12-26 DIAGNOSIS — Z79899 Other long term (current) drug therapy: Secondary | ICD-10-CM | POA: Diagnosis not present

## 2019-12-26 DIAGNOSIS — R0602 Shortness of breath: Secondary | ICD-10-CM | POA: Diagnosis not present

## 2019-12-26 DIAGNOSIS — E039 Hypothyroidism, unspecified: Secondary | ICD-10-CM | POA: Insufficient documentation

## 2019-12-26 DIAGNOSIS — I517 Cardiomegaly: Secondary | ICD-10-CM | POA: Diagnosis not present

## 2019-12-26 DIAGNOSIS — U071 COVID-19: Secondary | ICD-10-CM | POA: Insufficient documentation

## 2019-12-26 DIAGNOSIS — E1169 Type 2 diabetes mellitus with other specified complication: Secondary | ICD-10-CM | POA: Insufficient documentation

## 2019-12-26 DIAGNOSIS — J90 Pleural effusion, not elsewhere classified: Secondary | ICD-10-CM | POA: Diagnosis not present

## 2019-12-26 DIAGNOSIS — E785 Hyperlipidemia, unspecified: Secondary | ICD-10-CM | POA: Insufficient documentation

## 2019-12-26 DIAGNOSIS — Z20822 Contact with and (suspected) exposure to covid-19: Secondary | ICD-10-CM | POA: Diagnosis not present

## 2019-12-26 DIAGNOSIS — Z299 Encounter for prophylactic measures, unspecified: Secondary | ICD-10-CM | POA: Diagnosis not present

## 2019-12-26 DIAGNOSIS — J449 Chronic obstructive pulmonary disease, unspecified: Secondary | ICD-10-CM | POA: Diagnosis not present

## 2019-12-26 MED ORDER — SODIUM CHLORIDE 0.9 % IV SOLN: INTRAVENOUS | Status: DC | PRN

## 2019-12-26 MED ORDER — EPINEPHRINE 0.3 MG/0.3ML IJ SOAJ: 0.3000 mg | Freq: Once | INTRAMUSCULAR | Status: DC | PRN

## 2019-12-26 MED ORDER — ALBUTEROL SULFATE HFA 108 (90 BASE) MCG/ACT IN AERS: 2.0000 | INHALATION_SPRAY | Freq: Once | RESPIRATORY_TRACT | Status: DC | PRN

## 2019-12-26 MED ORDER — SODIUM CHLORIDE 0.9 % IV SOLN
1200.0000 mg | Freq: Once | INTRAVENOUS | Status: AC
  Administered 2019-12-26: 21:00:00 1200 mg via INTRAVENOUS
  Filled 2019-12-26: qty 10

## 2019-12-26 MED ORDER — ALBUTEROL SULFATE HFA 108 (90 BASE) MCG/ACT IN AERS
2.0000 | INHALATION_SPRAY | Freq: Once | RESPIRATORY_TRACT | Status: AC
  Administered 2019-12-26: 20:00:00 2 via RESPIRATORY_TRACT
  Filled 2019-12-26: qty 6.7

## 2019-12-26 MED ORDER — FAMOTIDINE IN NACL 20-0.9 MG/50ML-% IV SOLN: 20.0000 mg | Freq: Once | INTRAVENOUS | Status: DC | PRN

## 2019-12-26 MED ORDER — DIPHENHYDRAMINE HCL 50 MG/ML IJ SOLN: 50.0000 mg | Freq: Once | INTRAMUSCULAR | Status: DC | PRN

## 2019-12-26 MED ORDER — METHYLPREDNISOLONE SODIUM SUCC 125 MG IJ SOLR: 125.0000 mg | Freq: Once | INTRAMUSCULAR | Status: DC | PRN

## 2019-12-26 NOTE — Discharge Instructions (Addendum)
Use your inhaler every 4-6 hours as needed for shortness of breath and follow-up with your doctor next week for recheck.  Return if any problems

## 2019-12-26 NOTE — Progress Notes (Addendum)
Pharmacy COVID-19 Monoclonal Antibody Screening  Desiree Hogan was identified as being not hospitalized with symptoms from Covid-19 on admission but a positive PCR has been documented at urgent care center in [place], [place]. The patient may qualify for the use of monoclonal antibodies (mAB) for COVID-19 viral infection to prevent worsening symptoms stemming from Covid-19 infection.  The patient was identified based on a positive COVID-19 PCR and not requiring the use of supplemental oxygen at this time.  This patient meets the FDA criteria for Emergency Use Authorization of casirivimab/imdevimab or bamlanivimab/etesevimab.  Has a (+) direct SARS-CoV-2 viral test result  Is NOT hospitalized due to COVID-19  Is within 10 days of symptom onset  Has at least one of the high risk factor(s) for progression to severe COVID-19 and/or hospitalization as defined in EUA.  Specific high risk criteria : BMI > 25 and Chronic Lung Disease  The patient is unvaccinated against COVID-19.  Since the patient is unvaccinated and meets high risk criteria, the patient is eligible for mAB administration.   Plan: Based on the above discussion, the patient will receive one dose of the available COVID-19 mAB combination. Pharmacy will coordinate administration timing with patient's nurse. Recommended infusion monitoring parameters communicated to the nursing team.  Gillermina Hu, PharmD, BCPS, [facility] Clinical Pharmacist 12/26/2019  7:12 PM

## 2019-12-26 NOTE — ED Triage Notes (Signed)
Pt c/o oxygen saturation at home in the low 80's.  Patient tested positive for covid today at [facility] in [place].

## 2019-12-26 NOTE — ED Provider Notes (Signed)
[facility] EMERGENCY DEPARTMENT Provider Note   CSN: [id number] Arrival date & time: 12/26/19  1750     History Chief Complaint  Patient presents with  . Covid Positive    Desiree Hogan is a 64 y.o. female.  Patient complains of shortness of breath.  She was seen in the urgent care and has positive Covid.  Symptoms started about a week ago  The history is provided by the patient and medical records. No language interpreter was used.  Shortness of Breath Severity:  Moderate Onset quality:  Gradual Timing:  Constant Progression:  Worsening Chronicity:  Recurrent Context: activity   Relieved by:  Nothing Worsened by:  Nothing Ineffective treatments:  None tried Associated symptoms: no abdominal pain, no chest pain, no cough, no headaches and no rash        Past Medical History:  Diagnosis Date  . Abdominal pain   . Anemia   . Back pain   . Colitis   . COPD (chronic obstructive pulmonary disease) (HCC)    not on home o2  . Crohn's disease ([place]) 03/29/12  . Diabetes mellitus without complication ([place])   . Fibromyalgia   . H/O breast biopsy   . Hip pain   . Hyperlipidemia   . Hypothyroidism   . Irritable bowel syndrome   . Joint pain   . Nonspecific abnormal finding in stool contents   . Peripheral vascular disease ([place])   . Ulcer    Mouth    Patient Active Problem List   Diagnosis Date Noted  . Diverticulitis of colon 10/11/2017  . Crohn's colitis ([place]) 10/11/2017  . Fibromyalgia 07/14/2017  . ANA positive 07/14/2017  . Primary osteoarthritis of both knees 07/14/2017  . DDD (degenerative disc disease), lumbar 07/14/2017  . Hypermobility of joint 07/14/2017  . Age-related osteoporosis without current pathological fracture 07/14/2017  . Other insomnia 07/14/2017  . History of Crohn's disease 07/14/2017  . History of IBS 07/14/2017  . History of hypercholesterolemia 07/14/2017  . History of COPD 07/14/2017  . History of cholecystectomy 07/14/2017  .  Community acquired pneumonia 12/13/2016  . Pneumonia 12/13/2016  . Leukocytosis 12/13/2016  . Hypokalemia 12/13/2016  . Diabetes mellitus without complication ([place]) 99/24/2683    Past Surgical History:  Procedure Laterality Date  . BIOPSY  12/20/2017   Procedure: BIOPSY;  Surgeon: Rogene Houston, MD;  Location[facility];  Service: Endoscopy;;  cecal erosions  . CESAREAN SECTION  06/24/82  . CHOLECYSTECTOMY  1993   Gall Bladder  . COLONOSCOPY  Jan. 31, 2014  . COLONOSCOPY N/A 12/20/2017   Procedure: COLONOSCOPY;  Surgeon: Rogene Houston, MD;  [facility];  Service: Endoscopy;  Laterality: N/A;  2:25  . [place] SURGERY  2010 and 2011  . TONSILLECTOMY       OB History   No obstetric history on file.     Family History  Problem Relation Age of Onset  . Cancer Mother        Colon or vaginal ?  . Deep vein thrombosis Mother   . Hypertension Father   . Diabetes Father   . Heart disease Father        Heart Disease before age 34  . Diabetes Sister   . Diabetes Brother   . Hypertension Brother   . Healthy Son   . Healthy Daughter   . Healthy Daughter   . Healthy Daughter     Social History   Tobacco Use  .  Smoking status: Former Smoker    Packs/day: 0.75    Years: 20.00    Pack years: 15.00    Types: Cigarettes    Quit date: 01/03/2009    Years since quitting: 10.9  . Smokeless tobacco: Never Used  Vaping Use  . Vaping Use: Never used  Substance Use Topics  . Alcohol use: No  . Drug use: No    Home Medications Prior to Admission medications   Medication Sig Start Date End Date Taking? Authorizing Provider  acetaminophen (TYLENOL) 500 MG tablet Take 1,000 mg by mouth every 6 (six) hours as needed.   Yes [provider]  alendronate (FOSAMAX) 70 MG tablet Take 70 mg by mouth every Friday. Take with a full glass of water on an empty stomach.   Yes [provider]  BREO ELLIPTA 100-25 MCG/INH AEPB Inhale 1 puff into the lungs  daily.  09/28/15  Yes [provider]  CALCIUM GLUCONATE PO Take 600 mg by mouth 2 (two) times daily.    Yes [provider]  cetirizine (ZYRTEC) 10 MG tablet Take 10 mg by mouth daily.   Yes [provider]  cholecalciferol (VITAMIN D) 1000 units tablet Take 1,000 Units by mouth 2 (two) times daily.    Yes [provider]  ciprofloxacin (CIPRO) 500 MG tablet Take 500 mg by mouth 2 (two) times daily.   Yes [provider]  DULoxetine (CYMBALTA) 30 MG capsule TAKE 1 CAPSULE BY MOUTH EVERY DAY 10/07/19  Yes Deveshwar, Abel Presto, MD  gemfibrozil (LOPID) 600 MG tablet Take 600 mg by mouth 2 (two) times daily before a meal.   Yes [provider]  levothyroxine (SYNTHROID, LEVOTHROID) 125 MCG tablet Take 125 mcg by mouth daily. 11/29/16  Yes [provider]  lisinopril (ZESTRIL) 2.5 MG tablet Take 2.5 mg by mouth daily. 09/24/19  Yes [provider]  mesalamine (APRISO) 0.375 g 24 hr capsule Take 4 capsules (1.5 g total) by mouth daily. Due for yearly office visit 11/18/19  Yes Ezzard Standing, PA-C  predniSONE (STERAPRED UNI-PAK 21 TAB) 10 MG (21) TBPK tablet Take 10 mg by mouth See admin instructions. Take 6,5,4,3,2,1 by mouth daily   Yes [provider]  pregabalin (LYRICA) 50 MG capsule TAKE 1 CAPSULE BY MOUTH TWICE A DAY 10/24/19  Yes Kirsteins, Luanna Salk, MD  rosuvastatin (CRESTOR) 10 MG tablet Take 10 mg by mouth daily. 11/19/17  Yes [provider]  traMADol (ULTRAM) 50 MG tablet Take 1 tablet (50 mg total) by mouth 3 (three) times daily as needed. 12/13/19  Yes Kirsteins, Luanna Salk, MD  PROAIR HFA [phone] Base) MCG/ACT inhaler Inhale 1-2 puffs into the lungs every 6 (six) hours as needed for wheezing or shortness of breath.  11/18/15   [provider]  tiZANidine (ZANAFLEX) 4 MG tablet TAKE 1 TABLET BY MOUTH AT BEDTIME AS NEEDED Patient not taking: No sig reported 11/11/19   Ofilia Neas, PA-C  traMADol  (ULTRAM) 50 MG tablet TAKE 1 TABLET BY MOUTH THREE TIMES A DAY AS NEEDED Patient not taking: No sig reported 12/13/19   Charlett Blake, MD    Allergies    Bactrim [sulfamethoxazole-trimethoprim], Penicillins, and Tetracyclines & related  Review of Systems   Review of Systems  Constitutional: Negative for appetite change and fatigue.  HENT: Negative for congestion, ear discharge and sinus pressure.   Eyes: Negative for discharge.  Respiratory: Positive for shortness of breath. Negative for cough.   Cardiovascular:  Negative for chest pain.  Gastrointestinal: Negative for abdominal pain and diarrhea.  Genitourinary: Negative for frequency and hematuria.  Musculoskeletal: Negative for back pain.  Skin: Negative for rash.  Neurological: Negative for seizures and headaches.  Psychiatric/Behavioral: Negative for hallucinations.    Physical Exam Updated Vital Signs BP 127/68 (BP Location: Left Arm)   Pulse 96   Temp 99.7 F (37.6 C) (Oral)   Resp 18   Ht 5' 4"  (1.626 m)   Wt 85.7 kg   SpO2 92%   BMI 32.44 kg/m   Physical Exam Vitals and nursing note reviewed.  Constitutional:      Appearance: She is well-developed.  HENT:     Head: Normocephalic.     Nose: Nose normal.  Eyes:     General: No scleral icterus.    Extraocular Movements: EOM normal.     Conjunctiva/sclera: Conjunctivae normal.  Neck:     Thyroid: No thyromegaly.  Cardiovascular:     Rate and Rhythm: Normal rate and regular rhythm.     Heart sounds: No murmur heard. No friction rub. No gallop.   Pulmonary:     Breath sounds: No stridor. No wheezing or rales.  Chest:     Chest wall: No tenderness.  Abdominal:     General: There is no distension.     Tenderness: There is no abdominal tenderness. There is no rebound.  Musculoskeletal:        General: No edema. Normal range of motion.     Cervical back: Neck supple.  Lymphadenopathy:     Cervical: No cervical adenopathy.  Skin:    Findings: No  erythema or rash.  Neurological:     Mental Status: She is alert and oriented to person, place, and time.     Motor: No abnormal muscle tone.     Coordination: Coordination normal.  Psychiatric:        Mood and Affect: Mood and affect normal.        Behavior: Behavior normal.     ED Results / Procedures / Treatments   Labs (all labs ordered are listed, but only abnormal results are displayed) Labs Reviewed - No data to display  EKG None  Radiology DG Chest [facility] 1 View  Result Date: 12/26/2019 CLINICAL DATA:  Shortness of breath. Diabetes and COPD. COVID-19 positive. EXAM: PORTABLE CHEST 1 VIEW COMPARISON:  CT 04/11/2017.  Most recent plain film 12/13/2016. FINDINGS: Midline trachea. Cardiomegaly accentuated by AP portable technique. No right-sided pleural effusion. Breast tissue overlies the left costophrenic angle. No pneumothorax. Interstitial thickening is likely related to chronic bronchitis/COPD. Possible subtle peripheral upper lobe opacities bilaterally. No well-defined lobar consolidation. Left lung base not well evaluated. IMPRESSION: Possible subtle peripheral upper lobe opacities which given the clinical history may represent mild COVID-19 pneumonia. PA and lateral radiographs should be considered if possible. Cardiomegaly and low lung volumes. Suboptimal evaluation of the left lung base secondary to overlying soft tissues. Aortic Atherosclerosis (ICD10-I70.0). Electronically Signed   By: Abigail Miyamoto M.D.   On: 12/26/2019 19:29    Procedures Procedures (including critical care time)  Medications Ordered in ED Medications  0.9 %  sodium chloride infusion (has no administration in time range)  diphenhydrAMINE (BENADRYL) injection 50 mg (has no administration in time range)  famotidine (PEPCID) IVPB 20 mg premix (has no administration in time range)  methylPREDNISolone sodium succinate (SOLU-MEDROL) 125 mg/2 mL injection 125 mg (has no administration in time range)   albuterol (VENTOLIN HFA) 108 (90 Base)  MCG/ACT inhaler 2 puff (has no administration in time range)  EPINEPHrine (EPI-PEN) injection 0.3 mg (has no administration in time range)  albuterol (VENTOLIN HFA) 108 (90 Base) MCG/ACT inhaler 2 puff (2 puffs Inhalation Given 12/26/19 2012)  casirivimab-imdevimab (REGEN-COV) 1,200 mg in sodium chloride 0.9 % 110 mL IVPB (0 mg Intravenous Stopped 12/26/19 2110)    ED Course  I have reviewed the triage vital signs and the nursing notes.  Pertinent labs & imaging results that were available during my care of the patient were reviewed by me and considered in my medical decision making (see chart for details).    MDM Rules/Calculators/A&P                          Patient with Covid and some shortness of breath but not hypoxic.  Patient was given the monoclonal antibodies and sent home with albuterol follow-up Final Clinical Impression(s) / ED Diagnoses Final diagnoses:  None    Rx / DC Orders ED Discharge Orders    None       Milton Ferguson, MD 12/26/19 2152

## 2019-12-26 NOTE — ED Notes (Signed)
X-ray at bedside

## 2020-01-02 ENCOUNTER — Other Ambulatory Visit: Payer: Self-pay | Admitting: Rheumatology

## 2020-01-02 NOTE — Telephone Encounter (Signed)
Last Visit: 09/05/2019 Next Visit: 03/05/2020  Okay to refill per Dr. Estanislado Pandy

## 2020-01-24 ENCOUNTER — Encounter: Payer: Medicare Other | Admitting: Physical Medicine & Rehabilitation

## 2020-02-03 ENCOUNTER — Other Ambulatory Visit: Payer: Self-pay | Admitting: Physician Assistant

## 2020-02-03 NOTE — Telephone Encounter (Signed)
Last Visit: 09/05/2019 Next Visit: 03/05/2020  Current Dose per office note on 09/05/2019, tizanidine 4 mg p.o. nightly Dx: Fibromyalgia  Okay to refill Tizanidine?

## 2020-02-05 ENCOUNTER — Telehealth: Payer: Self-pay

## 2020-02-05 MED ORDER — PREGABALIN 50 MG PO CAPS: 50.0000 mg | ORAL_CAPSULE | Freq: Two times a day (BID) | ORAL | 2 refills | Status: DC

## 2020-02-05 NOTE — Telephone Encounter (Signed)
Pt called requesting refill on Pregabalin. Sent Rx [facility]

## 2020-02-14 ENCOUNTER — Telehealth: Payer: Self-pay

## 2020-02-14 ENCOUNTER — Other Ambulatory Visit: Payer: Self-pay

## 2020-02-14 IMAGING — DX DG WRIST COMPLETE 3+V*R*
4 series · 4 of 4 positions shown · non-contrast
Comparison: None.

CLINICAL DATA: Generalized right wrist and posterior forearm pain
with abrasions. Trip and fall injury. Tingling in the right hand.

EXAM:
RIGHT WRIST - COMPLETE 3+ VIEW; RIGHT FOREARM - 2 VIEW

[wrist pa]
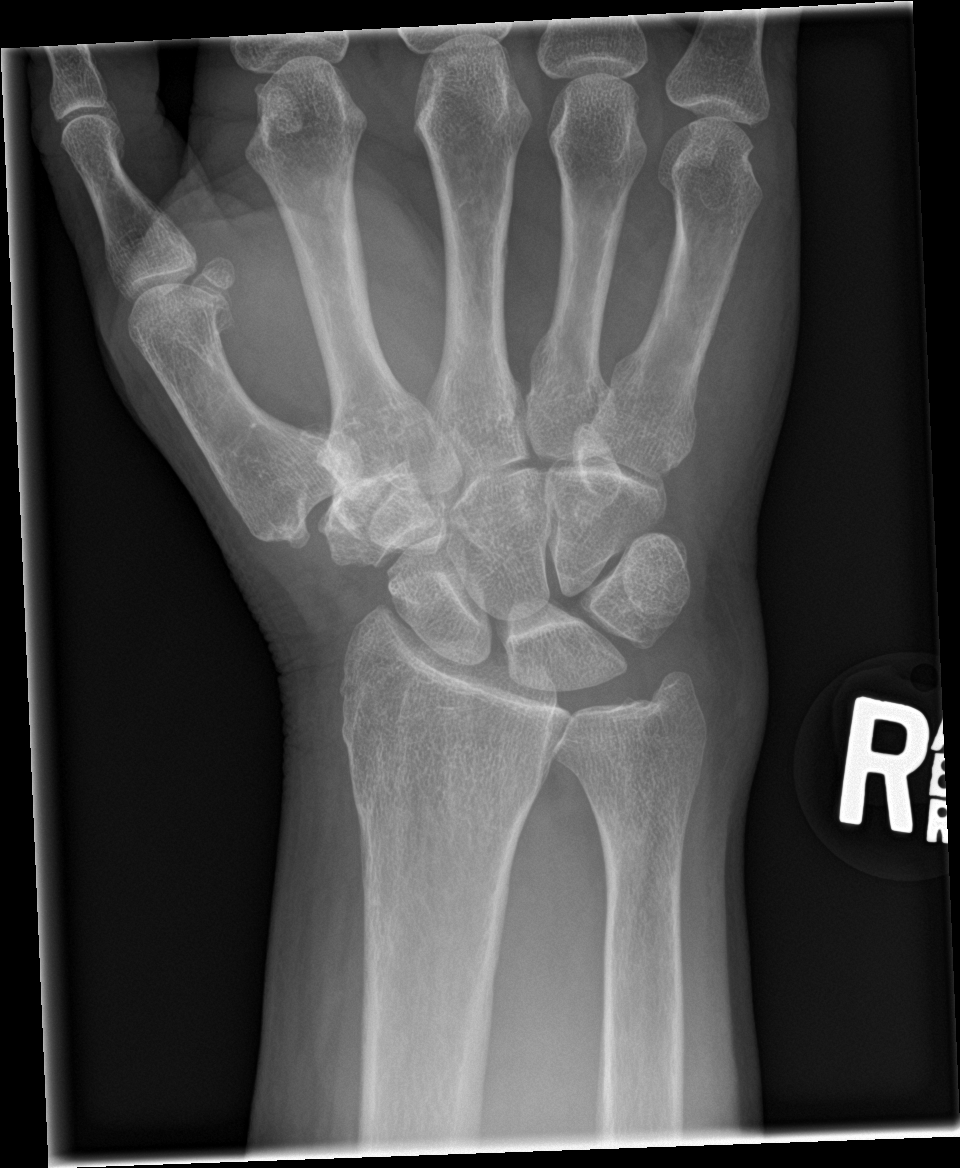

[wrist obl]
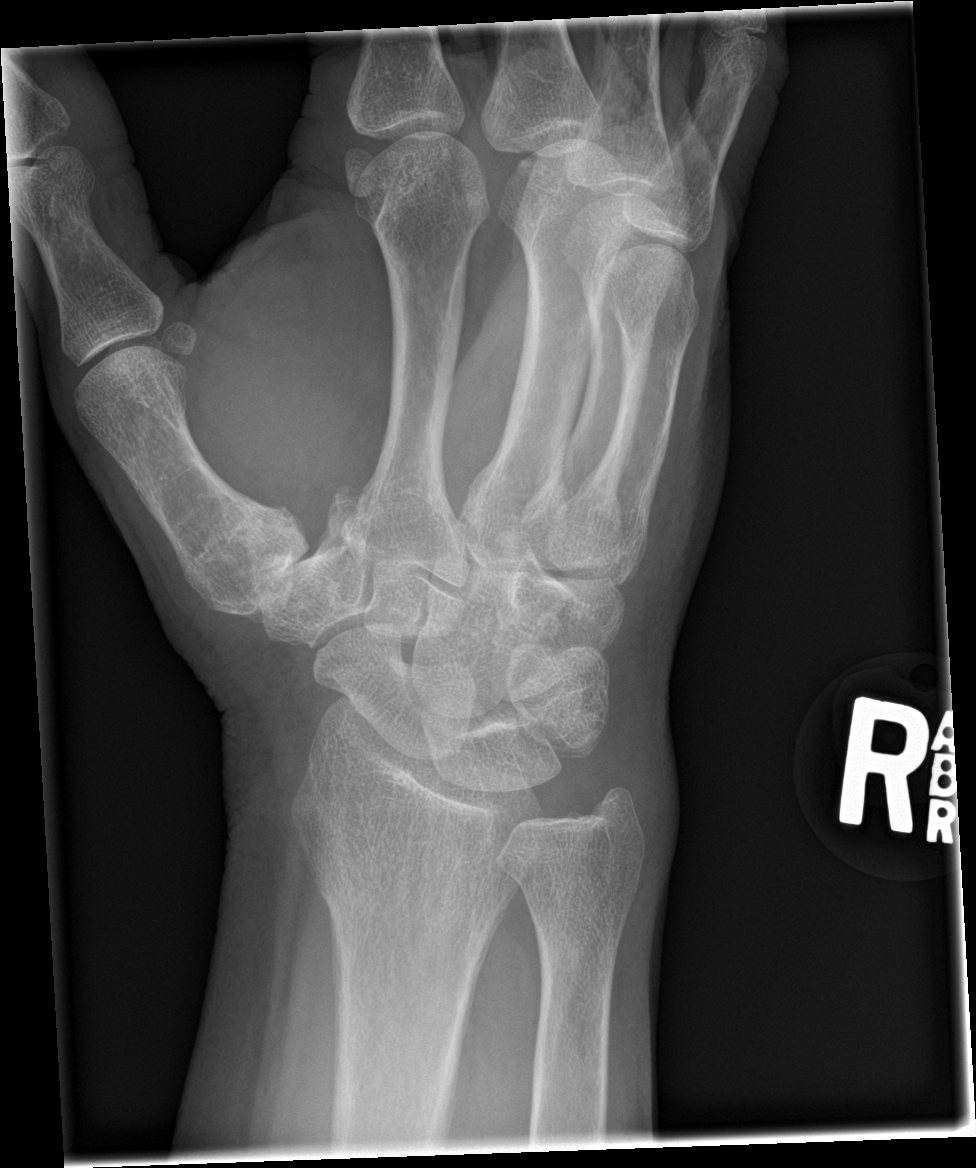

[wrist lat]
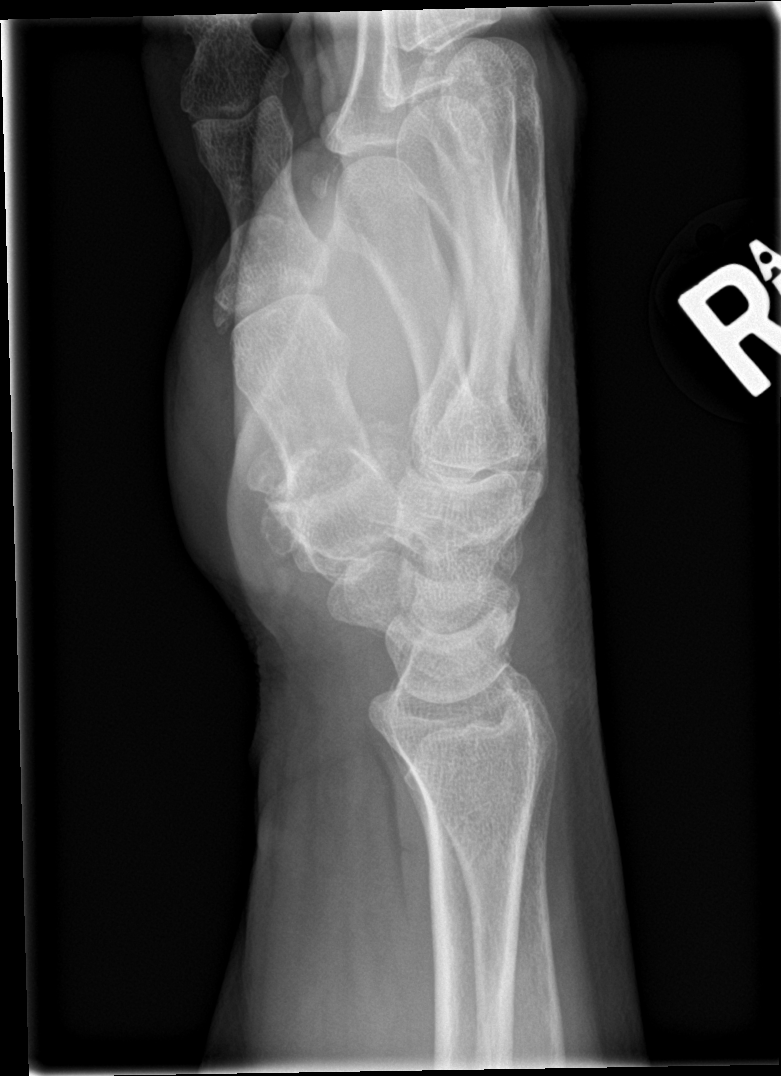

[wrist navicular]
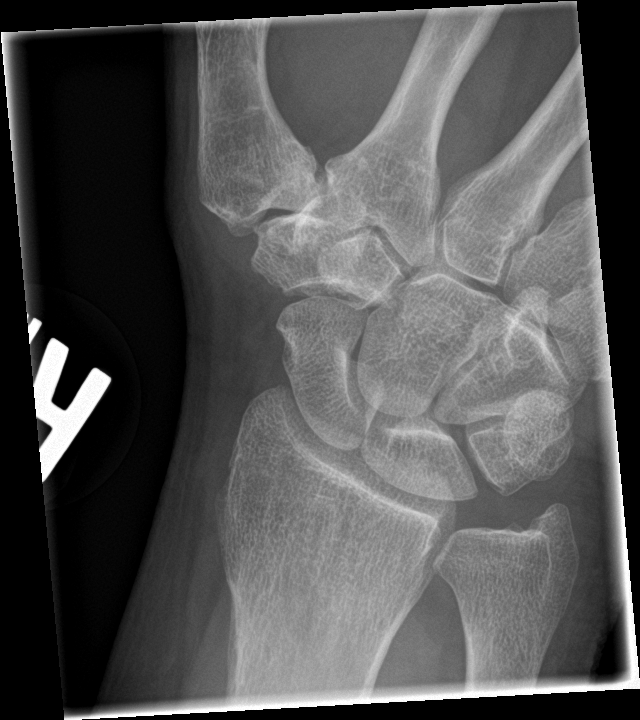

[4 of 4 positions shown; findings below may reference images not displayed]

FINDINGS: Four views of the right wrist and two views of the right forearm are
obtained.

There are degenerative changes in the STT, first carpometacarpal,
and radiocarpal joints. No evidence of acute fracture or dislocation
of the right wrist, radius, or ulna. No focal bone lesion or bone
destruction. Bone cortex appears intact. Soft tissues are
unremarkable.
IMPRESSION: No acute bony abnormalities demonstrated in the right wrist or right
forearm. Degenerative changes in the STT, first carpometacarpal, and
radiocarpal joints.

## 2020-02-14 MED ORDER — TRAMADOL HCL 50 MG PO TABS: 50.0000 mg | ORAL_TABLET | Freq: Three times a day (TID) | ORAL | 1 refills | Status: DC | PRN

## 2020-02-14 NOTE — Telephone Encounter (Signed)
TASK COMPLETED. Rx called. Patient informed.

## 2020-02-20 NOTE — Progress Notes (Signed)
Office Visit Note  Patient: Desiree Hogan             Date of Birth: [birth year]           MRN: [id number]             PCP: Glenda Chroman, MD Referring: Glenda Chroman, MD Visit Date: 03/05/2020 Occupation: @GUAROCC @  Subjective:  Medication management.   History of Present Illness: Desiree Hogan is a 65 y.o. female with a history of fibromyalgia and osteoarthritis. She is currently taking Lyrica 50 mg, tramadol 50 mg, tizanidine 4 mg and cymbalta 30 mg. Her tramadol prescription is managed by pain management and she takes it three times a day. She has gotten 2-3 flares where she has increased pain and stiffness in her shoulders, knees and back. She occasionally takes tylenol during her flares. She continues to have tenderness and tension in her trapezius muscles bilaterally. She walks daily but has not been able to do any stretches. She has intermittent difficulty sleeping at night due to pain. She has melatonin OTC that she will take as needed. She has tried PT in the past with no relief. She denies any joint swelling.   Activities of Daily Living:  Patient reports morning stiffness for 25-30 minutes.   Patient Reports nocturnal pain.  Difficulty dressing/grooming: Reports Difficulty climbing stairs: Reports Difficulty getting out of chair: Reports Difficulty using hands for taps, buttons, cutlery, and/or writing: Reports  Review of Systems  Constitutional: Positive for fatigue. Negative for night sweats, weight gain and weight loss.  HENT: Positive for mouth dryness and nose dryness. Negative for mouth sores, trouble swallowing and trouble swallowing.   Eyes: Positive for visual disturbance and dryness. Negative for pain, redness and itching.  Respiratory: Positive for shortness of breath and difficulty breathing. Negative for cough and hemoptysis.        COPD  Cardiovascular: Negative for chest pain, palpitations, hypertension, irregular heartbeat and swelling in legs/feet.   Gastrointestinal: Negative for abdominal pain, blood in stool, constipation and diarrhea.  Endocrine: Negative for increased urination.  Genitourinary: Negative for painful urination and vaginal dryness.  Musculoskeletal: Positive for arthralgias, joint pain, myalgias, morning stiffness, muscle tenderness and myalgias. Negative for joint swelling and muscle weakness.  Skin: Negative for color change, rash, hair loss, redness, skin tightness, ulcers and sensitivity to sunlight.  Allergic/Immunologic: Negative for susceptible to infections.  Neurological: Positive for headaches and weakness. Negative for dizziness, numbness, memory loss and night sweats.  Hematological: Negative for swollen glands.  Psychiatric/Behavioral: Positive for sleep disturbance. Negative for depressed mood and confusion. The patient is not nervous/anxious.     PMFS History:  Patient Active Problem List   Diagnosis Date Noted  . Diverticulitis of colon 10/11/2017  . Crohn's colitis ([place]) 10/11/2017  . Fibromyalgia 07/14/2017  . ANA positive 07/14/2017  . Primary osteoarthritis of both knees 07/14/2017  . DDD (degenerative disc disease), lumbar 07/14/2017  . Hypermobility of joint 07/14/2017  . Age-related osteoporosis without current pathological fracture 07/14/2017  . Other insomnia 07/14/2017  . History of Crohn's disease 07/14/2017  . History of IBS 07/14/2017  . History of hypercholesterolemia 07/14/2017  . History of COPD 07/14/2017  . History of cholecystectomy 07/14/2017  . Community acquired pneumonia 12/13/2016  . Pneumonia 12/13/2016  . Leukocytosis 12/13/2016  . Hypokalemia 12/13/2016  . Diabetes mellitus without complication ([place]) 56/21/3086    Past Medical History:  Diagnosis Date  . Abdominal pain   . Anemia   .  Back pain   . Colitis   . COPD (chronic obstructive pulmonary disease) (HCC)    not on home o2  . Crohn's disease ([place]) 03/29/12  . Diabetes mellitus without complication ([place])    . Fibromyalgia   . H/O breast biopsy   . Hip pain   . Hyperlipidemia   . Hypothyroidism   . Irritable bowel syndrome   . Joint pain   . Nonspecific abnormal finding in stool contents   . Peripheral vascular disease ([place])   . Ulcer    Mouth    Family History  Problem Relation Age of Onset  . Cancer Mother        Colon or vaginal ?  . Deep vein thrombosis Mother   . Hypertension Father   . Diabetes Father   . Heart disease Father        Heart Disease before age 64  . Diabetes Sister   . Diabetes Brother   . Hypertension Brother   . Healthy Son   . Healthy Daughter   . Healthy Daughter   . Healthy Daughter    Past Surgical History:  Procedure Laterality Date  . BIOPSY  12/20/2017   Procedure: BIOPSY;  Surgeon: Rogene Houston, MD;  [facility];  Service: Endoscopy;;  cecal erosions  . CESAREAN SECTION  06/24/82  . CHOLECYSTECTOMY  1993   Gall Bladder  . COLONOSCOPY  Jan. 31, 2014  . COLONOSCOPY N/A 12/20/2017   Procedure: COLONOSCOPY;  Surgeon: Rogene Houston, MD;  [facility];  Service: Endoscopy;  Laterality: N/A;  2:25  . [place] SURGERY  2010 and 2011  . TONSILLECTOMY     Social History   Social History Narrative  . Not on file    There is no immunization history on file for this patient.   Objective: Vital Signs: BP 123/69 (BP Location: Right Arm, Patient Position: Sitting, Cuff Size: Normal)   Pulse 86   Ht 5' 3"  (1.6 m)   Wt 188 lb 6.4 oz (85.5 kg)   BMI 33.37 kg/m    Physical Exam Constitutional:      Appearance: Normal appearance.  HENT:     Head: Normocephalic.  Cardiovascular:     Rate and Rhythm: Normal rate and regular rhythm.  Pulmonary:     Effort: Pulmonary effort is normal.  Abdominal:     Palpations: Abdomen is soft.  Skin:    General: Skin is warm and dry.  Neurological:     Mental Status: She is alert and oriented to person, place, and time.  Psychiatric:        Mood and Affect: Mood normal.       Musculoskeletal Exam: Good ROM of c-spine.  She had discomfort range of motion of thoracic spine and lumbar spine. Limited ROM with flexion of her shoulder joints. Tenderness and tension in her trapezius muscles bilaterally. Good ROM of her elbows, wrists, MCPs, PIPs, and DIPs bilaterally with no signs of synovitis. Mild thickening of her PIP joints bilaterally, consistent with osteoarthritis. Good ROM of her hips, knees, ankles and feet bilaterally with no signs of synovitis. No signs of effusion or warmth of her knees. No tenderness over plantar fascia. Tenderness to palpation of her SI joints bilaterally. Multiple positive tender points.   CDAI Exam: CDAI Score: -- Patient Global: --; Provider Global: -- Swollen: --; Tender: -- Joint Exam 03/05/2020   No joint exam has been documented for this visit   There is currently  no information documented on the homunculus. Go to the Rheumatology activity and complete the homunculus joint exam.  Investigation: No additional findings.  Imaging: No results found.  Recent Labs: Lab Results  Component Value Date   WBC 6.7 10/11/2017   HGB 12.2 10/11/2017   PLT 234 10/11/2017   NA 141 09/01/2017   K 3.9 09/01/2017   CL 107 09/01/2017   CO2 26 09/01/2017   GLUCOSE 120 (H) 09/01/2017   BUN 14 09/01/2017   CREATININE 0.79 09/01/2017   BILITOT 0.6 09/01/2017   ALKPHOS 64 09/01/2017   AST 28 09/01/2017   ALT 22 09/01/2017   PROT 7.5 09/01/2017   ALBUMIN 4.1 09/01/2017   CALCIUM 9.4 09/01/2017   GFRAA >60 09/01/2017    Speciality Comments: No specialty comments available.  Procedures:  Sacroiliac Joint Inj on 03/05/2020 1:56 PM Indications: pain Details: 27 G 1.5 in needle, posterior approach Medications: 1 mL lidocaine 1 %; 40 mg triamcinolone acetonide 40 MG/ML Aspirate: 0 mL Outcome: tolerated well, no immediate complications Procedure, treatment alternatives, risks and benefits explained, specific risks discussed. Consent was  given by the patient. Immediately prior to procedure a time out was called to verify the correct patient, procedure, equipment, support staff and site/side marked as required. Patient was prepped and draped in the usual sterile fashion.     Allergies: Bactrim [sulfamethoxazole-trimethoprim], Penicillins, and Tetracyclines & related   Assessment / Plan:     Visit Diagnoses: Fibromyalgia -she continues to have some generalized pain and discomfort from fibromyalgia.  She has generalized discomfort and positive tender points.  She is on tizanidine 4 mg p.o. nightly, Cymbalta 30 mg p.o. daily,  (tramadol and Lyrica by pain management).  Taking tizanidine as needed and maybe half tablet at bedtime was discussed.  Increased risk of dizziness and falling with muscle relaxers was discussed.  Trapezius muscle spasm-trapezius stretching exercises were discussed.  ANA positive - +RF, -CCP.  She had no clinical features of autoimmune disease.  Chronic SI joint pain-she has pain and discomfort in the bilateral SI joints.  Her left SI joint has been causing a lot of discomfort.  She requested cortisone injection.  After informed consent was obtained and side effects were discussed left SI joint was injected with cortisone as described above.  She tolerated the procedure well.  Postprocedure instructions were given.  Primary osteoarthritis of both knees-she has some discomfort in her bilateral knee joints.  Muscle strengthening exercises and weight loss was emphasized.  DDD (degenerative disc disease), lumbar-lower back exercises and core strengthening was discussed.  Hypermobility of joint-she has some hypermobility in her joints which contributes to the joint pain.  Age-related osteoporosis without current pathological fracture - Fosamax 70 mg po once weekly.  Managed by her PCP.  Other insomnia-good sleep hygiene and use of melatonin was discussed.  Other medical problems are listed as  follows:  History of COPD  History of hypercholesterolemia  History of cholecystectomy  History of Crohn's disease  History of IBS  Orders: Orders Placed This Encounter  Procedures  . Sacroiliac Joint Inj   No orders of the defined types were placed in this encounter.    Follow-Up Instructions: Return in about 6 months (around 09/05/2020) for Osteoarthritis,FMS.   Bo Merino, MD  Note - This record has been created using Editor, commissioning.  Chart creation errors have been sought, but may not always  have been located. Such creation errors do not reflect on  the standard of medical care.

## 2020-02-25 ENCOUNTER — Encounter: Payer: Medicare Other | Attending: Physical Medicine & Rehabilitation | Admitting: Physical Medicine & Rehabilitation

## 2020-02-25 ENCOUNTER — Encounter: Payer: Self-pay | Admitting: Physical Medicine & Rehabilitation

## 2020-02-25 ENCOUNTER — Other Ambulatory Visit: Payer: Self-pay

## 2020-02-25 VITALS — BP 135/82 | HR 88 | Temp 98.5°F | Ht 64.0 in | Wt 185.2 lb

## 2020-02-25 DIAGNOSIS — Z5181 Encounter for therapeutic drug level monitoring: Secondary | ICD-10-CM

## 2020-02-25 DIAGNOSIS — Z79891 Long term (current) use of opiate analgesic: Secondary | ICD-10-CM

## 2020-02-25 DIAGNOSIS — G894 Chronic pain syndrome: Secondary | ICD-10-CM | POA: Diagnosis not present

## 2020-02-25 DIAGNOSIS — M19012 Primary osteoarthritis, left shoulder: Secondary | ICD-10-CM

## 2020-02-25 NOTE — Progress Notes (Signed)
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 Pain interferes with ADLs and sleep  The patient was placed in a seated position the acromioclavicular joint was scanned in the long axis view, areas marked prepped with Betadine, sterile technique was utilized. Under direct long axis view 1% lidocaine was infiltrated to the skin and subcutaneous tissues. A 25-gauge 1.5 inch needle reached the acromioclavicular joint space. Then a solution containing 1 mL of lidocaine 1% and 0.5 ML of Celestone 6 mg per mL was injected. Patient tolerated procedure well. Images were saved. Postprocedure instructions given

## 2020-02-25 NOTE — Patient Instructions (Addendum)
Prescription drug monitoring labs  ordered since you are taking a chronic controlled  medication.  Identification of non-prescribed controlled substance(s) or illicit drug(s) can result in switching medications or even discharge from clinic. Not finding prescribed medication on Urine testing also may result in switching medications or discharge from clinic.  Please refer to your signed controlled substance agreement for further information.

## 2020-02-28 LAB — TOXASSURE SELECT,+ANTIDEPR,UR

## 2020-03-02 DIAGNOSIS — J449 Chronic obstructive pulmonary disease, unspecified: Secondary | ICD-10-CM | POA: Diagnosis not present

## 2020-03-02 DIAGNOSIS — Z720 Tobacco use: Secondary | ICD-10-CM | POA: Diagnosis not present

## 2020-03-02 DIAGNOSIS — E1165 Type 2 diabetes mellitus with hyperglycemia: Secondary | ICD-10-CM | POA: Diagnosis not present

## 2020-03-03 ENCOUNTER — Telehealth: Payer: Self-pay | Admitting: *Deleted

## 2020-03-03 NOTE — Telephone Encounter (Signed)
Urine drug screen for this encounter is consistent for prescribed medication 

## 2020-03-05 ENCOUNTER — Encounter: Payer: Self-pay | Admitting: Rheumatology

## 2020-03-05 ENCOUNTER — Ambulatory Visit: Payer: Medicare Other | Admitting: Rheumatology

## 2020-03-05 ENCOUNTER — Other Ambulatory Visit: Payer: Self-pay

## 2020-03-05 VITALS — BP 123/69 | HR 86 | Ht 63.0 in | Wt 188.4 lb

## 2020-03-05 DIAGNOSIS — Z8719 Personal history of other diseases of the digestive system: Secondary | ICD-10-CM

## 2020-03-05 DIAGNOSIS — M170 Bilateral primary osteoarthritis of knee: Secondary | ICD-10-CM

## 2020-03-05 DIAGNOSIS — M810 Age-related osteoporosis without current pathological fracture: Secondary | ICD-10-CM

## 2020-03-05 DIAGNOSIS — M5136 Other intervertebral disc degeneration, lumbar region: Secondary | ICD-10-CM | POA: Diagnosis not present

## 2020-03-05 DIAGNOSIS — G8929 Other chronic pain: Secondary | ICD-10-CM | POA: Diagnosis not present

## 2020-03-05 DIAGNOSIS — R768 Other specified abnormal immunological findings in serum: Secondary | ICD-10-CM

## 2020-03-05 DIAGNOSIS — M533 Sacrococcygeal disorders, not elsewhere classified: Secondary | ICD-10-CM

## 2020-03-05 DIAGNOSIS — Z9049 Acquired absence of other specified parts of digestive tract: Secondary | ICD-10-CM

## 2020-03-05 DIAGNOSIS — M797 Fibromyalgia: Secondary | ICD-10-CM | POA: Diagnosis not present

## 2020-03-05 DIAGNOSIS — Z8639 Personal history of other endocrine, nutritional and metabolic disease: Secondary | ICD-10-CM | POA: Diagnosis not present

## 2020-03-05 DIAGNOSIS — M62838 Other muscle spasm: Secondary | ICD-10-CM | POA: Diagnosis not present

## 2020-03-05 DIAGNOSIS — Z8709 Personal history of other diseases of the respiratory system: Secondary | ICD-10-CM

## 2020-03-05 DIAGNOSIS — M249 Joint derangement, unspecified: Secondary | ICD-10-CM

## 2020-03-05 DIAGNOSIS — G4709 Other insomnia: Secondary | ICD-10-CM

## 2020-03-05 MED ORDER — TRIAMCINOLONE ACETONIDE 40 MG/ML IJ SUSP
40.0000 mg | INTRAMUSCULAR | Status: AC | PRN
  Administered 2020-03-05: 40 mg via INTRA_ARTICULAR

## 2020-03-05 MED ORDER — LIDOCAINE HCL 1 % IJ SOLN
1.0000 mL | INTRAMUSCULAR | Status: AC | PRN
  Administered 2020-03-05: 1 mL

## 2020-03-09 ENCOUNTER — Other Ambulatory Visit: Payer: Self-pay | Admitting: Physical Medicine & Rehabilitation

## 2020-03-10 ENCOUNTER — Other Ambulatory Visit: Payer: Self-pay | Admitting: *Deleted

## 2020-03-10 MED ORDER — PREGABALIN 50 MG PO CAPS: 50.0000 mg | ORAL_CAPSULE | Freq: Two times a day (BID) | ORAL | 2 refills | Status: DC

## 2020-03-10 NOTE — Addendum Note (Signed)
Addended by: Caro Hight on: 03/10/2020 02:07 PM   Modules accepted: Orders

## 2020-03-11 ENCOUNTER — Other Ambulatory Visit (INDEPENDENT_AMBULATORY_CARE_PROVIDER_SITE_OTHER): Payer: Self-pay | Admitting: *Deleted

## 2020-03-11 ENCOUNTER — Telehealth (INDEPENDENT_AMBULATORY_CARE_PROVIDER_SITE_OTHER): Payer: Self-pay | Admitting: Internal Medicine

## 2020-03-11 MED ORDER — MESALAMINE ER 0.375 G PO CP24: 1500.0000 mg | ORAL_CAPSULE | Freq: Every day | ORAL | 0 refills | Status: DC

## 2020-03-11 NOTE — Telephone Encounter (Signed)
Patient called the office stated she needs a refill on mesalamine - please advise - ph# [phone]

## 2020-03-11 NOTE — Telephone Encounter (Signed)
I talked with the patient. Made her aware that I had talked with Dr.Rehman and he approved me to call in the Mesalamine a 3 month supply. This was e scribed to [facility]. Patient was reminded that she had appointment with [place] in June. She states that she will see [place] then.

## 2020-03-17 ENCOUNTER — Telehealth: Payer: Self-pay | Admitting: *Deleted

## 2020-03-17 MED ORDER — TRAMADOL HCL 50 MG PO TABS: 50.0000 mg | ORAL_TABLET | Freq: Three times a day (TID) | ORAL | 2 refills | Status: DC | PRN

## 2020-03-17 NOTE — Telephone Encounter (Signed)
Desiree Hogan called for a refill of her tramadol. UDS was appropriate. Refills called to her pharmacy through her next appt 05/22/20.

## 2020-03-31 ENCOUNTER — Other Ambulatory Visit: Payer: Self-pay | Admitting: Rheumatology

## 2020-03-31 NOTE — Telephone Encounter (Signed)
Next Visit: 09/03/2020  Last Visit: 03/05/2020  Last Fill: 01/02/2020  Dx: Fibromyalgia   Current Dose per office note on 03/05/2020, Cymbalta 30 mg p.o. daily  Okay to refill Cymbalta?

## 2020-04-06 ENCOUNTER — Telehealth: Payer: Self-pay

## 2020-04-06 NOTE — Telephone Encounter (Signed)
Received fax from [facility] in regards to concurrent use of three or more CNS-ACTIVE drugs for >=30 days.   Tramadol, pregabalin and duloxetine. Duloxetine is prescribed by our office and the other two medications are prescribed by Dr. Alysia Penna.   Note per Hazel Sams, PA-C "please make patient aware of this notification/alert. She is on the reduced dose of cymbalta. She can further discuss other meds prescribed by other providers".   I attempted to contact patient and left message on machine for patient to call the office. Document sent to the scan center.

## 2020-04-07 DIAGNOSIS — J069 Acute upper respiratory infection, unspecified: Secondary | ICD-10-CM | POA: Diagnosis not present

## 2020-04-07 DIAGNOSIS — J449 Chronic obstructive pulmonary disease, unspecified: Secondary | ICD-10-CM | POA: Diagnosis not present

## 2020-04-07 DIAGNOSIS — I700 Atherosclerosis of aorta: Secondary | ICD-10-CM | POA: Diagnosis not present

## 2020-04-07 DIAGNOSIS — Z299 Encounter for prophylactic measures, unspecified: Secondary | ICD-10-CM | POA: Diagnosis not present

## 2020-04-07 DIAGNOSIS — R059 Cough, unspecified: Secondary | ICD-10-CM | POA: Diagnosis not present

## 2020-04-10 ENCOUNTER — Other Ambulatory Visit: Payer: Self-pay | Admitting: *Deleted

## 2020-04-10 MED ORDER — PREGABALIN 50 MG PO CAPS: 50.0000 mg | ORAL_CAPSULE | Freq: Two times a day (BID) | ORAL | 2 refills | Status: DC

## 2020-04-14 ENCOUNTER — Other Ambulatory Visit (INDEPENDENT_AMBULATORY_CARE_PROVIDER_SITE_OTHER): Payer: Self-pay | Admitting: Internal Medicine

## 2020-04-14 MED ORDER — MESALAMINE 400 MG PO CPDR: 800.0000 mg | DELAYED_RELEASE_CAPSULE | Freq: Two times a day (BID) | ORAL | 1 refills | Status: DC

## 2020-04-14 NOTE — Progress Notes (Signed)
Patient needs office visit.

## 2020-05-02 DIAGNOSIS — E1165 Type 2 diabetes mellitus with hyperglycemia: Secondary | ICD-10-CM | POA: Diagnosis not present

## 2020-05-02 DIAGNOSIS — Z720 Tobacco use: Secondary | ICD-10-CM | POA: Diagnosis not present

## 2020-05-02 DIAGNOSIS — J449 Chronic obstructive pulmonary disease, unspecified: Secondary | ICD-10-CM | POA: Diagnosis not present

## 2020-05-03 ENCOUNTER — Other Ambulatory Visit: Payer: Self-pay | Admitting: Physician Assistant

## 2020-05-04 NOTE — Telephone Encounter (Signed)
Next Visit: 09/03/2020  Last Visit: 03/05/2020  Last Fill: 02/03/2020  Dx: Fibromyalgia   Current Dose per office note on 03/05/3020, tizanidine 4 mg p.o. nightly  Okay to refill Tizanidine?

## 2020-05-22 ENCOUNTER — Ambulatory Visit: Payer: Medicare Other | Admitting: Physical Medicine & Rehabilitation

## 2020-06-09 DIAGNOSIS — Z87891 Personal history of nicotine dependence: Secondary | ICD-10-CM | POA: Diagnosis not present

## 2020-06-09 DIAGNOSIS — Z299 Encounter for prophylactic measures, unspecified: Secondary | ICD-10-CM | POA: Diagnosis not present

## 2020-06-09 DIAGNOSIS — E039 Hypothyroidism, unspecified: Secondary | ICD-10-CM | POA: Diagnosis not present

## 2020-06-09 DIAGNOSIS — Z6834 Body mass index (BMI) 34.0-34.9, adult: Secondary | ICD-10-CM | POA: Diagnosis not present

## 2020-06-09 DIAGNOSIS — Z1339 Encounter for screening examination for other mental health and behavioral disorders: Secondary | ICD-10-CM | POA: Diagnosis not present

## 2020-06-09 DIAGNOSIS — Z1331 Encounter for screening for depression: Secondary | ICD-10-CM | POA: Diagnosis not present

## 2020-06-09 DIAGNOSIS — E119 Type 2 diabetes mellitus without complications: Secondary | ICD-10-CM | POA: Diagnosis not present

## 2020-06-09 DIAGNOSIS — E1165 Type 2 diabetes mellitus with hyperglycemia: Secondary | ICD-10-CM | POA: Diagnosis not present

## 2020-06-09 DIAGNOSIS — Z7189 Other specified counseling: Secondary | ICD-10-CM | POA: Diagnosis not present

## 2020-06-09 DIAGNOSIS — Z79899 Other long term (current) drug therapy: Secondary | ICD-10-CM | POA: Diagnosis not present

## 2020-06-09 DIAGNOSIS — Z0000 Encounter for general adult medical examination without abnormal findings: Secondary | ICD-10-CM | POA: Diagnosis not present

## 2020-06-09 DIAGNOSIS — E7800 Pure hypercholesterolemia, unspecified: Secondary | ICD-10-CM | POA: Diagnosis not present

## 2020-06-15 DIAGNOSIS — E2839 Other primary ovarian failure: Secondary | ICD-10-CM | POA: Diagnosis not present

## 2020-06-16 ENCOUNTER — Other Ambulatory Visit: Payer: Self-pay

## 2020-06-16 ENCOUNTER — Encounter: Payer: Medicare HMO | Attending: Physical Medicine & Rehabilitation | Admitting: Physical Medicine & Rehabilitation

## 2020-06-16 ENCOUNTER — Encounter: Payer: Self-pay | Admitting: Physical Medicine & Rehabilitation

## 2020-06-16 VITALS — BP 131/78 | HR 88 | Temp 98.3°F | Ht 63.0 in | Wt 195.2 lb

## 2020-06-16 DIAGNOSIS — M1711 Unilateral primary osteoarthritis, right knee: Secondary | ICD-10-CM

## 2020-06-16 MED ORDER — PREGABALIN 50 MG PO CAPS: 50.0000 mg | ORAL_CAPSULE | Freq: Two times a day (BID) | ORAL | 5 refills | Status: DC

## 2020-06-16 MED ORDER — TRAMADOL HCL 50 MG PO TABS: 50.0000 mg | ORAL_TABLET | Freq: Three times a day (TID) | ORAL | 5 refills | Status: DC | PRN

## 2020-06-16 NOTE — Patient Instructions (Signed)
Try Voltaren gel (Diclofenac gel) 3-4 times a day for your knee arthritis pain

## 2020-06-16 NOTE — Progress Notes (Signed)
Subjective:    Patient ID: Desiree Hogan, female    DOB: 1955/09/06, 65 y.o.   MRN: [id number] 69-year-old female referred for the evaluation of chronic pain complaints by her rheumatologist.  The patient complains of pain in multiple areas including low back, buttocks, bilateral knees, bilateral hands, and her neck.  She actually points to the trapezius area instead of the neck itself. She has a history of lumbar fusion L3-4 L4-5 as well as a history of left lumbar radiculopathy.  The patient had her last spine surgery in 2011 by Dr. Sherwood Gambler.  There was a removal of L3-L4 interbody implant as well as left L3 pedicle screw. The patient's low back pain is bilateral.  It is more below the waist then above the waist. She has no pain radiating down the legs.  She does have bilateral knee pain but this does not feel like it is connected. She has been on tramadol 50 mg initially 3 times daily but back down to twice daily with concerns of serotonin syndrome because she was on Cymbalta.  Her Cymbalta dose is 30 mg/day She has been tried on gabapentin in the past but was told that it may give her a problem due to her Crohn's disease HPI CC:  Back , Left knee, Left shoulder Left knee may be worst, no recent falls or trauma, no recent injections of the knee.  She had a discussion with her rheumatologist about what sounds like a Synvisc injection in the future.  Reviewed x-rays of the left and right knee both showing moderate medial compartment osteoarthritis as well as patellofemoral osteoarthritis.  The patient has had no significant swelling or redness around the knee.  She does use Voltaren gel but does not feel she gets much benefit.  Left shoulder injection Feb 2022 was helpful and is still working  Pain Inventory Average Pain 6 Pain Right Now 6 My pain is intermittent and stabbing  In the last 24 hours, has pain interfered with the following? General activity 1 Relation with others 0 Enjoyment of  life 0 What TIME of day is your pain at its worst? evening Sleep (in general) Fair  Pain is worse with: some activites Pain improves with: medication and injections Relief from Meds: 7  Family History  Problem Relation Age of Onset   Cancer Mother        Colon or vaginal ?   Deep vein thrombosis Mother    Hypertension Father    Diabetes Father    Heart disease Father        Heart Disease before age 16   Diabetes Sister    Diabetes Brother    Hypertension Brother    Healthy Son    Healthy Daughter    Healthy Daughter    Healthy Daughter    Social History   Socioeconomic History   Marital status: Married    Spouse name: Not on file   Number of children: Not on file   Years of education: Not on file   Highest education level: Not on file  Occupational History   Not on file  Tobacco Use   Smoking status: Former    Packs/day: 0.75    Years: 20.00    Pack years: 15.00    Types: Cigarettes    Quit date: 01/03/2009    Years since quitting: 11.4   Smokeless tobacco: Never  Vaping Use   Vaping Use: Never used  Substance and Sexual Activity   Alcohol use:  No   Drug use: No   Sexual activity: Not on file  Other Topics Concern   Not on file  Social History Narrative   Not on file   Social Determinants of Health   Financial Resource Strain: Not on file  Food Insecurity: Not on file  Transportation Needs: Not on file  Physical Activity: Not on file  Stress: Not on file  Social Connections: Not on file   Past Surgical History:  Procedure Laterality Date   BIOPSY  12/20/2017   Procedure: BIOPSY;  Surgeon: Rogene Houston, MD;  [facility];  Service: Endoscopy;;  cecal erosions   CESAREAN SECTION  06/24/82   [place] Bladder   COLONOSCOPY  Jan. 31, 2014   COLONOSCOPY N/A 12/20/2017   Procedure: COLONOSCOPY;  Surgeon: Rogene Houston, MD;  [facility];  Service: Endoscopy;  Laterality: N/A;  2:25   [place]  2010  and 2011   TONSILLECTOMY     Past Surgical History:  Procedure Laterality Date   BIOPSY  12/20/2017   Procedure: BIOPSY;  Surgeon: Rogene Houston, MD;  [facility];  Service: Endoscopy;;  cecal erosions   CESAREAN SECTION  06/24/82   [place] Bladder   COLONOSCOPY  Jan. 31, 2014   COLONOSCOPY N/A 12/20/2017   Procedure: COLONOSCOPY;  Surgeon: Rogene Houston, MD;  [facility];  Service: Endoscopy;  Laterality: N/A;  2:25   [place]  2010 and 2011   TONSILLECTOMY     Past Medical History:  Diagnosis Date   Abdominal pain    Anemia    Back pain    Colitis    COPD (chronic obstructive pulmonary disease) (HCC)    not on home o2   Crohn's disease ([place]) 03/29/12   Diabetes mellitus without complication (HCC)    Fibromyalgia    H/O breast biopsy    Hip pain    Hyperlipidemia    Hypothyroidism    Irritable bowel syndrome    Joint pain    Nonspecific abnormal finding in stool contents    Peripheral vascular disease (HCC)    Ulcer    Mouth   BP 131/78 (BP Location: Right Arm, Patient Position: Sitting, Cuff Size: Large)   Pulse 88   Temp 98.3 F (36.8 C) (Oral)   Ht 5' 3"  (1.6 m)   Wt 195 lb 3.2 oz (88.5 kg)   SpO2 91%   BMI 34.58 kg/m   Opioid Risk Score:   Fall Risk Score:  `1  Depression screen PHQ 2/9  Depression screen [facility] 2/9 10/24/2019 03/26/2019  Decreased Interest 1 2  Down, Depressed, Hopeless 1 1  PHQ - 2 Score 2 3  Altered sleeping - 2  Tired, decreased energy - 2  Change in appetite - 0  Feeling bad or failure about yourself  - 0  Trouble concentrating - 2  Moving slowly or fidgety/restless - 1  Suicidal thoughts - 0  PHQ-9 Score - 10     Review of Systems  Constitutional: Negative.   HENT: Negative.    Eyes: Negative.   Respiratory: Negative.    Cardiovascular: Negative.   Gastrointestinal:  Positive for abdominal pain.  Endocrine: Negative.   Genitourinary: Negative.   Musculoskeletal:   Positive for back pain and gait problem.       Left shoulder pain , left knee pain  Skin: Negative.   Allergic/Immunologic: Negative.  Hematological: Negative.   Psychiatric/Behavioral: Negative.        Objective:   Physical Exam Vitals and nursing note reviewed.  Constitutional:      Appearance: She is obese.  HENT:     Head: Normocephalic and atraumatic.  Eyes:     Extraocular Movements: Extraocular movements intact.     Conjunctiva/sclera: Conjunctivae normal.     Pupils: Pupils are equal, round, and reactive to light.  Musculoskeletal:     Cervical back: Normal range of motion.     Comments: Right knee Tenderness palpation medial joint line.  Lateral joint line tenderness over the superior and inferior aspect of the patella No evidence of knee effusion no evidence of erythema Left knee Tenderness to palpation along the medial joint line mild lateral joint line tenderness.  Tenderness over the superior and inferior aspect of the patella.  No evidence of knee effusion no evidence of erythema full range of motion Negative straight leg raising bilaterally Lumbar spine has 75% flexion extension lateral bending and rotation.  There are some tenderness over the PSIS area bilaterally  Skin:    General: Skin is warm and dry.  Neurological:     Mental Status: She is alert and oriented to person, place, and time.     Motor: Motor function is intact.     Coordination: Coordination is intact.     Gait: Gait is intact.     Comments: Motor strength is 5/5 bilateral hip flexor knee extensor ankle dorsiflexor  Psychiatric:        Mood and Affect: Mood normal.        Behavior: Behavior normal.          Assessment & Plan:   #1.  Chronic pain syndrome multifactorial patient has bilateral knee osteoarthritis, sacroiliac pain as well as lumbar postlaminectomy syndrome status post hardware removal of lower lumbar fusion.  The patient has remained functional on the following medication  regimen Tramadol 50 mg 3 times daily Lyrica 50 mg twice daily PDMP reviewed No signs of aberrant drug behavior Will continue current dosing Recheck in 6 months Last urine drug screen was February 2022 and was consistent

## 2020-06-23 ENCOUNTER — Other Ambulatory Visit: Payer: Self-pay

## 2020-06-23 ENCOUNTER — Encounter (INDEPENDENT_AMBULATORY_CARE_PROVIDER_SITE_OTHER): Payer: Self-pay | Admitting: Internal Medicine

## 2020-06-23 ENCOUNTER — Ambulatory Visit (INDEPENDENT_AMBULATORY_CARE_PROVIDER_SITE_OTHER): Payer: Medicare Other | Admitting: Internal Medicine

## 2020-06-23 VITALS — BP 151/87 | HR 93 | Temp 98.8°F | Ht 63.0 in | Wt 194.0 lb

## 2020-06-23 DIAGNOSIS — R197 Diarrhea, unspecified: Secondary | ICD-10-CM

## 2020-06-23 DIAGNOSIS — K50118 Crohn's disease of large intestine with other complication: Secondary | ICD-10-CM | POA: Diagnosis not present

## 2020-06-23 MED ORDER — DICYCLOMINE HCL 10 MG PO CAPS: 10.0000 mg | ORAL_CAPSULE | Freq: Three times a day (TID) | ORAL | 1 refills | Status: DC | PRN

## 2020-06-23 NOTE — Progress Notes (Signed)
Presenting complaint;  Follow-up for Crohn's colitis. Patient complains of abdominal pain and diarrhea.  Database and subjective:  Patient is 65 year old Caucasian female who has Crohn's colitis and is here for scheduled visit.  She was diagnosed with inflammatory bowel disease in 2014 and thought to have Crohn's disease.  She was subsequently told she had ulcerative colitis.  Last colonoscopy in December 2019.  She has been maintained on oral mesalamine and has done well.  She was last seen in July 2020.  Patient says she is not doing well.  She has been having intermittent lower abdominal cramping.  She is also having bouts of nausea and vomited once 2 weeks ago.  No hematemesis.  She used to have constipation alternate with diarrhea but lately she has had loose stools.  She denies melena but has seen some fresh blood with her bowel movements. Her appetite is fair.  She has gained 4 pounds since her last visit. No recent history of travel or antibiotic use.  Current Medications: Outpatient Encounter Medications as of 06/23/2020  Medication Sig   acetaminophen (TYLENOL) 500 MG tablet Take 1,000 mg by mouth every 6 (six) hours as needed.   alendronate (FOSAMAX) 70 MG tablet Take 70 mg by mouth every Friday. Take with a full glass of water on an empty stomach.   BREO ELLIPTA 100-25 MCG/INH AEPB Inhale 1 puff into the lungs daily.    CALCIUM GLUCONATE PO Take 600 mg by mouth 2 (two) times daily.    cetirizine (ZYRTEC) 10 MG tablet Take 10 mg by mouth daily.   cholecalciferol (VITAMIN D) 1000 units tablet Take 1,000 Units by mouth 2 (two) times daily.    DULoxetine (CYMBALTA) 30 MG capsule TAKE 1 CAPSULE BY MOUTH EVERY DAY   gemfibrozil (LOPID) 600 MG tablet Take 600 mg by mouth 2 (two) times daily before a meal.   levothyroxine (SYNTHROID, LEVOTHROID) 125 MCG tablet Take 125 mcg by mouth daily.   lisinopril (ZESTRIL) 2.5 MG tablet Take 2.5 mg by mouth daily.   Mesalamine (ASACOL) 400 MG CPDR  DR capsule Take 2 capsules (800 mg total) by mouth 2 (two) times daily.   pregabalin (LYRICA) 50 MG capsule Take 1 capsule (50 mg total) by mouth 2 (two) times daily.   PROAIR HFA 108 (90 Base) MCG/ACT inhaler Inhale 1-2 puffs into the lungs every 6 (six) hours as needed for wheezing or shortness of breath.    rosuvastatin (CRESTOR) 10 MG tablet Take 10 mg by mouth daily.   tiZANidine (ZANAFLEX) 4 MG tablet TAKE 1 TABLET BY MOUTH EVERY DAY AT BEDTIME AS NEEDED   traMADol (ULTRAM) 50 MG tablet Take 1 tablet (50 mg total) by mouth 3 (three) times daily as needed.   [DISCONTINUED] ciprofloxacin (CIPRO) 500 MG tablet Take 500 mg by mouth 2 (two) times daily.   No facility-administered encounter medications on file as of 06/23/2020.    Objective: Blood pressure (!) 151/87, pulse 93, temperature 98.8 F (37.1 C), temperature source Oral, height 5' 3"  (1.6 m), weight 194 lb (88 kg). Patient is alert and in no acute distress. She is wearing a mask. Conjunctiva is pink. Sclera is nonicteric Oropharyngeal mucosa is normal. No neck masses or thyromegaly noted. Cardiac exam with regular rhythm normal S1 and S2. No murmur or gallop noted. Lungs are clear to auscultation. Abdomen is full.  Bowel sounds are normal.  Abdomen is soft.  She has tenderness at both iliac fossae.  No guarding.  No organomegaly or masses.  No LE edema or clubbing noted.  Labs/studies Results:   CBC Latest Ref Rng & Units 10/11/2017 09/01/2017 12/14/2016  WBC 3.8 - 10.8 Thousand/uL 6.7 6.9 13.9(H)  Hemoglobin 11.7 - 15.5 g/dL 12.2 12.5 11.4(L)  Hematocrit 35.0 - 45.0 % 35.9 38.4 36.2  Platelets 140 - 400 Thousand/uL 234 211 208    CMP Latest Ref Rng & Units 09/01/2017 12/14/2016 12/13/2016  Glucose 70 - 99 mg/dL 120(H) 115(H) 128(H)  BUN 8 - 23 mg/dL 14 12 18   Creatinine 0.44 - 1.00 mg/dL 0.79 0.67 0.87  Sodium 135 - 145 mmol/L 141 137 136  Potassium 3.5 - 5.1 mmol/L 3.9 3.7 3.3(L)  Chloride 98 - 111 mmol/L 107 106 103   CO2 22 - 32 mmol/L 26 26 23   Calcium 8.9 - 10.3 mg/dL 9.4 8.2(L) 9.3  Total Protein 6.5 - 8.1 g/dL 7.5 6.1(L) 6.8  Total Bilirubin 0.3 - 1.2 mg/dL 0.6 0.7 1.1  Alkaline Phos 38 - 126 U/L 64 61 77  AST 15 - 41 U/L 28 12(L) 18  ALT 0 - 44 U/L 22 10(L) 13(L)    Hepatic Function Latest Ref Rng & Units 09/01/2017 12/14/2016 12/13/2016  Total Protein 6.5 - 8.1 g/dL 7.5 6.1(L) 6.8  Albumin 3.5 - 5.0 g/dL 4.1 3.0(L) 3.6  AST 15 - 41 U/L 28 12(L) 18  ALT 0 - 44 U/L 22 10(L) 13(L)  Alk Phosphatase 38 - 126 U/L 64 61 77  Total Bilirubin 0.3 - 1.2 mg/dL 0.6 0.7 1.1  Bilirubin, Direct 0.1 - 0.5 mg/dL - - 0.2    No recent blood work on [facility].  Assessment:  #1.  Inflammatory bowel disease.  She has Crohn's colitis based on colonoscopy and biopsy of December 2019 and she has been maintained on oral mesalamine.  Now she is having symptoms to suggest relapse.  It remains to be seen if the symptoms are due to IBS which she has history of.  #2.  Diarrhea.  She has history of IBS with intermittent diarrhea and constipation.  Now she is having diarrhea.  Need to rule out infection.   Plan:  News prescription for dicyclomine sent to patient's pharmacy.  Patient advised to take at least 2 doses a day if not 3. Hemoccult x1. Patient will go to the lab for CBC with differential, comprehensive chemistry panel and CRP. GI pathogen panel. Fecal calprotectin. Further recommendations to follow. Office visit in 3 months.

## 2020-06-23 NOTE — Patient Instructions (Signed)
Hemoccult x1. Physician will call with results of blood test and stool studies.

## 2020-06-24 LAB — COMPREHENSIVE METABOLIC PANEL
AG Ratio: 1.8 (calc) (ref 1.0–2.5)
ALT: 24 U/L (ref 6–29)
AST: 35 U/L (ref 10–35)
Albumin: 4.9 g/dL (ref 3.6–5.1)
Alkaline phosphatase (APISO): 70 U/L (ref 37–153)
BUN: 15 mg/dL (ref 7–25)
CO2: 26 mmol/L (ref 20–32)
Calcium: 10.2 mg/dL (ref 8.6–10.4)
Chloride: 103 mmol/L (ref 98–110)
Creat: 0.82 mg/dL (ref 0.50–0.99)
Globulin: 2.8 g/dL (calc) (ref 1.9–3.7)
Glucose, Bld: 122 mg/dL — ABNORMAL HIGH (ref 65–99)
Potassium: 4.7 mmol/L (ref 3.5–5.3)
Sodium: 139 mmol/L (ref 135–146)
Total Bilirubin: 0.4 mg/dL (ref 0.2–1.2)
Total Protein: 7.7 g/dL (ref 6.1–8.1)

## 2020-06-24 LAB — CBC WITH DIFFERENTIAL/PLATELET
Absolute Monocytes: 668 cells/uL (ref 200–950)
Basophils Absolute: 60 cells/uL (ref 0–200)
Basophils Relative: 0.8 %
Eosinophils Absolute: 330 cells/uL (ref 15–500)
Eosinophils Relative: 4.4 %
HCT: 42.1 % (ref 35.0–45.0)
Hemoglobin: 14 g/dL (ref 11.7–15.5)
Lymphs Abs: 1920 cells/uL (ref 850–3900)
MCH: 29.7 pg (ref 27.0–33.0)
MCHC: 33.3 g/dL (ref 32.0–36.0)
MCV: 89.2 fL (ref 80.0–100.0)
MPV: 12.3 fL (ref 7.5–12.5)
Monocytes Relative: 8.9 %
Neutro Abs: 4523 cells/uL (ref 1500–7800)
Neutrophils Relative %: 60.3 %
Platelets: 254 10*3/uL (ref 140–400)
RBC: 4.72 10*6/uL (ref 3.80–5.10)
RDW: 11.9 % (ref 11.0–15.0)
Total Lymphocyte: 25.6 %
WBC: 7.5 10*3/uL (ref 3.8–10.8)

## 2020-06-24 LAB — C-REACTIVE PROTEIN: CRP: 1.5 mg/L (ref <8.0)

## 2020-06-26 ENCOUNTER — Telehealth (INDEPENDENT_AMBULATORY_CARE_PROVIDER_SITE_OTHER): Payer: Self-pay

## 2020-06-26 NOTE — Telephone Encounter (Signed)
   Diagnosis: Dark Stools   Result(s)   Card 1: Negative:        Completed by: Lisamarie Coke S,CMA   HEMOCCULT SENSA DEVELOPER: LOT#:  [id number] EXPIRATION DATE: 06/2022   HEMOCCULT SENSA CARD:  LOT#:  [id number] EXPIRATION DATE: 07/2020   CARD CONTROL RESULTS:NEGATIVE/ Negative and Positive was Positive    ADDITIONAL COMMENTS:  I called and left a detailed message on the patients voicemail that test was good and no blood was detected.

## 2020-06-28 ENCOUNTER — Other Ambulatory Visit: Payer: Self-pay | Admitting: Physician Assistant

## 2020-06-29 NOTE — Telephone Encounter (Signed)
Next Visit: 09/03/2020  Last Visit: 03/05/2020  Last Fill: 03/31/2020  DX: Fibromyalgia   Current Dose per office note on 03/05/2020: Cymbalta 30 mg p.o. daily,   Okay to refill cymbalta?

## 2020-06-30 NOTE — Telephone Encounter (Signed)
Stool is heme negative

## 2020-06-30 NOTE — Telephone Encounter (Signed)
Stool studies pending Patient stool sample to lab yesterday

## 2020-07-02 DIAGNOSIS — Z720 Tobacco use: Secondary | ICD-10-CM | POA: Diagnosis not present

## 2020-07-02 DIAGNOSIS — J449 Chronic obstructive pulmonary disease, unspecified: Secondary | ICD-10-CM | POA: Diagnosis not present

## 2020-07-02 DIAGNOSIS — E1165 Type 2 diabetes mellitus with hyperglycemia: Secondary | ICD-10-CM | POA: Diagnosis not present

## 2020-07-03 ENCOUNTER — Telehealth (INDEPENDENT_AMBULATORY_CARE_PROVIDER_SITE_OTHER): Payer: Self-pay

## 2020-07-03 DIAGNOSIS — K50118 Crohn's disease of large intestine with other complication: Secondary | ICD-10-CM

## 2020-07-03 DIAGNOSIS — R197 Diarrhea, unspecified: Secondary | ICD-10-CM

## 2020-07-03 LAB — GASTROINTESTINAL PATHOGEN PANEL PCR
C. difficile Tox A/B, PCR: NOT DETECTED
Campylobacter, PCR: NOT DETECTED
Cryptosporidium, PCR: NOT DETECTED
E coli (ETEC) LT/ST PCR: NOT DETECTED
E coli (STEC) stx1/stx2, PCR: NOT DETECTED
E coli 0157, PCR: NOT DETECTED
Giardia lamblia, PCR: NOT DETECTED
Norovirus, PCR: NOT DETECTED
Rotavirus A, PCR: NOT DETECTED
Salmonella, PCR: NOT DETECTED
Shigella, PCR: NOT DETECTED

## 2020-07-03 NOTE — Telephone Encounter (Signed)
I have reordered the lab for [facility] and have placed the orders at the front desk for the patient to pick up.  Per [facility] lab they cancelled patients Desiree Hogan path panel they say no specimen received. I called the patient and left a vm asked that she please return call to the office.

## 2020-07-07 NOTE — Telephone Encounter (Signed)
Patient aware that the test was cancelled and she will come by the office to pick up the order and have it redone.

## 2020-07-07 NOTE — Telephone Encounter (Signed)
I called and left a message for the patient to return call.

## 2020-07-15 ENCOUNTER — Other Ambulatory Visit (INDEPENDENT_AMBULATORY_CARE_PROVIDER_SITE_OTHER): Payer: Self-pay | Admitting: Internal Medicine

## 2020-07-15 NOTE — Telephone Encounter (Signed)
Last seen 06/23/2020 for Crohn's

## 2020-07-17 ENCOUNTER — Encounter (INDEPENDENT_AMBULATORY_CARE_PROVIDER_SITE_OTHER): Payer: Self-pay

## 2020-07-17 DIAGNOSIS — R109 Unspecified abdominal pain: Secondary | ICD-10-CM | POA: Insufficient documentation

## 2020-07-17 DIAGNOSIS — R1031 Right lower quadrant pain: Secondary | ICD-10-CM | POA: Insufficient documentation

## 2020-07-17 DIAGNOSIS — R1032 Left lower quadrant pain: Secondary | ICD-10-CM | POA: Insufficient documentation

## 2020-08-14 ENCOUNTER — Other Ambulatory Visit (INDEPENDENT_AMBULATORY_CARE_PROVIDER_SITE_OTHER): Payer: Self-pay | Admitting: Internal Medicine

## 2020-08-17 NOTE — Telephone Encounter (Signed)
Will address with Dr. Laural Golden.

## 2020-08-20 NOTE — Progress Notes (Signed)
Office Visit Note  Patient: Desiree Hogan             Date of Birth: 06-23-1955           MRN: [id number]             PCP: Glenda Chroman, MD Referring: Glenda Chroman, MD Visit Date: 09/03/2020 Occupation: @GUAROCC @  Subjective:  Left CMC joint pain   History of Present Illness: Desiree Hogan is a 65 y.o. female with history of fibromyalgia, osteoarthritis, and DDD.  She presents today with increased discomfort in the left knee joint which started several days ago.  She denies any injury or overuse activities prior to the onset of symptoms.  She states that she woke up with the pain several days ago.  She has occasional bouts of discomfort in her left thumb and has tried wearing a thumb spica on the past as well as using Voltaren gel topically with minimal relief.  She denies any joint swelling at this time.  She has been taking tramadol 50 mg 1 tablet 3 times daily for pain relief. She denies any recent fibromyalgia flares.  She is having some trapezius muscle tension and tenderness currently.  She continues to take tizanidine 4 mg at bedtime as needed for muscle spasms.  She requested a refill today. She continues to take Fosamax 70 mg 1 tablet by mouth once weekly.  She is also taking calcium and vitamin D supplement as recommended.  She denies any recent falls or fractures.      Activities of Daily Living:  Patient reports morning stiffness for 1 hour.   Patient Reports nocturnal pain.  Difficulty dressing/grooming: Denies Difficulty climbing stairs: Denies Difficulty getting out of chair: Denies Difficulty using hands for taps, buttons, cutlery, and/or writing: Reports  Review of Systems  Constitutional:  Positive for fatigue.  HENT:  Negative for mouth dryness.   Eyes:  Negative for dryness.  Respiratory:  Negative for shortness of breath.   Cardiovascular:  Negative for swelling in legs/feet.  Gastrointestinal:  Positive for diarrhea.  Endocrine: Positive for heat  intolerance.  Genitourinary:  Negative for difficulty urinating.  Musculoskeletal:  Positive for joint pain, joint pain, joint swelling, muscle weakness, morning stiffness and muscle tenderness.  Skin:  Negative for rash.  Allergic/Immunologic: Negative for susceptible to infections.  Neurological:  Positive for weakness.  Hematological:  Positive for bruising/bleeding tendency.  Psychiatric/Behavioral:  Positive for sleep disturbance.    PMFS History:  Patient Active Problem List   Diagnosis Date Noted   Abdominal pain 07/17/2020   Bilateral lower abdominal cramping 07/17/2020   Diarrhea 06/23/2020   Diverticulitis of colon 10/11/2017   Crohn's colitis ([place]) 10/11/2017   Fibromyalgia 07/14/2017   ANA positive 07/14/2017   Primary osteoarthritis of both knees 07/14/2017   DDD (degenerative disc disease), lumbar 07/14/2017   Hypermobility of joint 07/14/2017   Age-related osteoporosis without current pathological fracture 07/14/2017   Other insomnia 07/14/2017   History of Crohn's disease 07/14/2017   History of IBS 07/14/2017   History of hypercholesterolemia 07/14/2017   History of COPD 07/14/2017   History of cholecystectomy 07/14/2017   Community acquired pneumonia 12/13/2016   Pneumonia 12/13/2016   Leukocytosis 12/13/2016   Hypokalemia 12/13/2016   Diabetes mellitus without complication ([place]) 00/17/4944    Past Medical History:  Diagnosis Date   Abdominal pain    Anemia    Back pain    Colitis    COPD (chronic obstructive pulmonary  disease) ([place])    not on home o2   Crohn's disease ([place]) 03/29/12   Diabetes mellitus without complication ([facility])    Fibromyalgia    H/O breast biopsy    Hip pain    Hyperlipidemia    Hypothyroidism    Irritable bowel syndrome    Joint pain    Nonspecific abnormal finding in stool contents    Peripheral vascular disease ([place])    Ulcer    Mouth    Family History  Problem Relation Age of Onset   Cancer Mother        Colon or  vaginal ?   Deep vein thrombosis Mother    Hypertension Father    Diabetes Father    Heart disease Father        Heart Disease before age 31   Diabetes Sister    Diabetes Brother    Hypertension Brother    Healthy Son    Healthy Daughter    Healthy Daughter    Healthy Daughter    Past Surgical History:  Procedure Laterality Date   BIOPSY  12/20/2017   Procedure: BIOPSY;  Surgeon: Rogene Houston, MD;  Location: [facility];  Service: Endoscopy;;  cecal erosions   CESAREAN SECTION  06/24/82   [place] Bladder   COLONOSCOPY  Jan. 31, 2014   COLONOSCOPY N/A 12/20/2017   Procedure: COLONOSCOPY;  Surgeon: Rogene Houston, MD;  Location: [facility];  Service: Endoscopy;  Laterality: N/A;  2:25   SPINE SURGERY  2010 and 2011   TONSILLECTOMY     Social History   Social History Narrative   Not on file    There is no immunization history on file for this patient.   Objective: Vital Signs: BP (!) 158/72 (BP Location: Left Arm, Patient Position: Sitting, Cuff Size: Normal)   Pulse 80   Resp 16   Ht 5' 3"  (1.6 m)   Wt 195 lb (88.5 kg)   BMI 34.54 kg/m    Physical Exam Vitals and nursing note reviewed.  Constitutional:      Appearance: She is well-developed.  HENT:     Head: Normocephalic and atraumatic.  Eyes:     Conjunctiva/sclera: Conjunctivae normal.  Pulmonary:     Effort: Pulmonary effort is normal.  Abdominal:     Palpations: Abdomen is soft.  Musculoskeletal:     Cervical back: Normal range of motion.  Skin:    General: Skin is warm and dry.     Capillary Refill: Capillary refill takes less than 2 seconds.  Neurological:     Mental Status: She is alert and oriented to person, place, and time.  Psychiatric:        Behavior: Behavior normal.     Musculoskeletal Exam: C-spine is slightly limited range of motion with lateral rotation.  Trapezius muscle tension and tenderness bilaterally.  Painful range of motion of both shoulder  joints to about 120 degrees.  Elbow joints, wrist joints, MCPs, PIPs, DIPs have good range of motion with no synovitis.  PIP and DIP prominence consistent with osteoarthritis of both hands.  [place] joint prominence bilaterally.  Tenderness over the left [facility] joint noted.  Complete fist formation bilaterally.  Hip joints have limited and painful range of motion bilaterally.  Knee joints have good range of motion with no warmth or effusion.  Ankle joints have good range of motion with no tenderness or joint swelling.  CDAI Exam: CDAI Score: -- Patient  Global: --; Provider Global: -- Swollen: 0 ; Tender: 1  Joint Exam 09/03/2020      Right  Left  CMC      Tender     Investigation: No additional findings.  Imaging: No results found.  Recent Labs: Lab Results  Component Value Date   WBC 7.5 06/23/2020   HGB 14.0 06/23/2020   PLT 254 06/23/2020   NA 139 06/23/2020   K 4.7 06/23/2020   CL 103 06/23/2020   CO2 26 06/23/2020   GLUCOSE 122 (H) 06/23/2020   BUN 15 06/23/2020   CREATININE 0.82 06/23/2020   BILITOT 0.4 06/23/2020   ALKPHOS 64 09/01/2017   AST 35 06/23/2020   ALT 24 06/23/2020   PROT 7.7 06/23/2020   ALBUMIN 4.1 09/01/2017   CALCIUM 10.2 06/23/2020   GFRAA >60 09/01/2017    Speciality Comments: No specialty comments available.  Procedures:  No procedures performed Allergies: Bactrim [sulfamethoxazole-trimethoprim], Penicillins, and Tetracyclines & related    Assessment / Plan:     Visit Diagnoses: Fibromyalgia -She experiences intermittent myalgias and muscle tenderness due to fibromyalgia.  She is experiencing trapezius muscle tension and tenderness bilaterally.  She has been taking Zanaflex 4 mg at bedtime as needed for muscle spasms.  In the past she has had trigger point injections which alleviated her discomfort.  She was advised to notify us if she would like to have trigger point injections performed again in the future.  She remains on Cymbalta 30 mg 1 capsule  daily and is prescribed tramadol and Lyrica by pain management.  A refill of tizanidine was sent to the pharmacy today.  Discussed the importance of regular exercise and good sleep hygiene. She will follow-up in the office in 6 months.   Trapezius muscle spasm: She has trapezius muscle tension and tenderness bilaterally.  She experiences muscle spasms on occasion.  She takes Zanaflex 4 mg at bedtime as needed for muscle spasms.  She was advised to notify us if she would like to return for trigger point injections in the future.  ANA positive - +RF, -CCP.  She had no clinical features of systemic lupus.  Chronic SI joint pain: She has no SI joint tenderness palpation on examination today.  Pain in both hands:  She presents today with increased pain in the left [facility] joint which started 2 to 3 days ago.  She continues to have intermittent bouts of pain and stiffness in the left CMC joint.  She has not had any recent injuries or overuse activities.  She has tried using a thumb spica as well as has applied Voltaren gel topically as needed for pain relief with minimal improvement in her symptoms.  She remains on tramadol 50 mg 1 tablet by mouth 3 times daily for pain relief.  She has PIP and DIP prominence consistent with osteoarthritis of both hands.  CMC joint thickening and tenderness over the left CMC joint was noted on examination today.  No tenderness or synovitis over MCP joints noted.  She was able to make a complete fist bilaterally.  X-rays of both hands were updated today.  Different treatment options were discussed today in detail.  She was encouraged to purchase a left CMC joint brace as well as arthritis compression gloves.  She can continue to use Voltaren gel topically as needed for pain relief.  Discussed the importance of joint protection and muscle strengthening.  A jar gripper was provided for assistance. She was given a handout of hand exercises  to perform.  She was also given a list of natural  anti-inflammatories which she can start taking on a daily basis.  All questions were addressed.  If her discomfort persists or worsens she will return for an ultrasound-guided left [facility] joint cortisone injection in the future.  Plan: XR Hand 2 View Right, XR Hand 2 View Left  Primary osteoarthritis of both knees: She has good range of motion of both knee joints on examination today.  No warmth or effusion was noted.  DDD (degenerative disc disease), lumbar: She experiences intermittent discomfort in her lower back.  She is not having any symptoms of radiculopathy at this time.  She takes tramadol 50 mg 1 tablet by mouth 3 times daily for pain relief and tizanidine 4 mg at bedtime for muscle spasms.  Hypermobility of joint: Discussed the importance of regular exercise.  Age-related osteoporosis without current pathological fracture -She continues to take Fosamax 70 mg 1 tablet by mouth once weekly.  She remains on a calcium and vitamin D supplement as recommended.  She has not had any recent falls or fractures.  Managed by her PCP.  Other insomnia: She experiences occasional nocturnal pain which causes worsening insomnia.  She has been taking tizanidine 4 mg at bedtime for muscle spasms and insomnia.  Other medical conditions are listed as follows:  History of hypercholesterolemia  History of COPD  History of Crohn's disease  History of cholecystectomy  History of IBS    Orders: Orders Placed This Encounter  Procedures   XR Hand 2 View Right   XR Hand 2 View Left   Meds ordered this encounter  Medications   tiZANidine (ZANAFLEX) 4 MG tablet    Sig: Take 1 tablet (4 mg total) by mouth at bedtime as needed for muscle spasms.    Dispense:  90 tablet    Refill:  0      Follow-Up Instructions: Return in about 6 months (around 03/03/2021) for Fibromyalgia, Osteoarthritis, DDD.   Ofilia Neas, PA-C  Note - This record has been created using Dragon software.  Chart creation  errors have been sought, but may not always  have been located. Such creation errors do not reflect on  the standard of medical care.

## 2020-09-03 ENCOUNTER — Ambulatory Visit: Payer: Self-pay

## 2020-09-03 ENCOUNTER — Other Ambulatory Visit: Payer: Self-pay

## 2020-09-03 ENCOUNTER — Ambulatory Visit: Payer: Medicare HMO | Admitting: Physician Assistant

## 2020-09-03 ENCOUNTER — Encounter: Payer: Self-pay | Admitting: Physician Assistant

## 2020-09-03 VITALS — BP 158/72 | HR 80 | Resp 16 | Ht 63.0 in | Wt 195.0 lb

## 2020-09-03 DIAGNOSIS — Z8639 Personal history of other endocrine, nutritional and metabolic disease: Secondary | ICD-10-CM | POA: Diagnosis not present

## 2020-09-03 DIAGNOSIS — G4709 Other insomnia: Secondary | ICD-10-CM

## 2020-09-03 DIAGNOSIS — M810 Age-related osteoporosis without current pathological fracture: Secondary | ICD-10-CM

## 2020-09-03 DIAGNOSIS — Z8709 Personal history of other diseases of the respiratory system: Secondary | ICD-10-CM

## 2020-09-03 DIAGNOSIS — M249 Joint derangement, unspecified: Secondary | ICD-10-CM

## 2020-09-03 DIAGNOSIS — M62838 Other muscle spasm: Secondary | ICD-10-CM | POA: Diagnosis not present

## 2020-09-03 DIAGNOSIS — R768 Other specified abnormal immunological findings in serum: Secondary | ICD-10-CM

## 2020-09-03 DIAGNOSIS — M533 Sacrococcygeal disorders, not elsewhere classified: Secondary | ICD-10-CM

## 2020-09-03 DIAGNOSIS — M170 Bilateral primary osteoarthritis of knee: Secondary | ICD-10-CM | POA: Diagnosis not present

## 2020-09-03 DIAGNOSIS — M5136 Other intervertebral disc degeneration, lumbar region: Secondary | ICD-10-CM

## 2020-09-03 DIAGNOSIS — M79641 Pain in right hand: Secondary | ICD-10-CM

## 2020-09-03 DIAGNOSIS — M797 Fibromyalgia: Secondary | ICD-10-CM

## 2020-09-03 DIAGNOSIS — Z8719 Personal history of other diseases of the digestive system: Secondary | ICD-10-CM

## 2020-09-03 DIAGNOSIS — M79642 Pain in left hand: Secondary | ICD-10-CM | POA: Diagnosis not present

## 2020-09-03 DIAGNOSIS — G8929 Other chronic pain: Secondary | ICD-10-CM

## 2020-09-03 DIAGNOSIS — Z9049 Acquired absence of other specified parts of digestive tract: Secondary | ICD-10-CM

## 2020-09-03 MED ORDER — TIZANIDINE HCL 4 MG PO TABS
4.0000 mg | ORAL_TABLET | Freq: Every evening | ORAL | 0 refills | Status: DC | PRN
Start: 2020-09-03 — End: 2020-12-04

## 2020-09-03 NOTE — Patient Instructions (Signed)
Hand Exercises Hand exercises can be helpful for almost anyone. These exercises can strengthen the hands, improve flexibility and movement, and increase blood flow to the hands. These results can make work and daily tasks easier. Hand exercises can be especially helpful for people who have joint pain from arthritis or have nerve damage from overuse (carpal tunnel syndrome). These exercises can also help people who have injured a hand. Exercises Most of these hand exercises are gentle stretching and motion exercises. It is usually safe to do them often throughout the day. Warming up your hands before exercise may help to reduce stiffness. You can do this with gentle massage or by placing your hands in warm water for 10-15 minutes. It is normal to feel some stretching, pulling, tightness, or mild discomfort as you begin new exercises. This will gradually improve. Stop an exercise right away if you feel sudden, severe pain or your pain gets worse. Ask your health care provider which exercises are best for you. Knuckle bend or "claw" fist  Stand or sit with your arm, hand, and all five fingers pointed straight up. Make sure to keep your wrist straight during the exercise. Gently bend your fingers down toward your palm until the tips of your fingers are touching the top of your palm. Keep your big knuckle straight and just bend the small knuckles in your fingers. Hold this position for __________ seconds. Straighten (extend) your fingers back to the starting position. Repeat this exercise 5-10 times with each hand. Full finger fist  Stand or sit with your arm, hand, and all five fingers pointed straight up. Make sure to keep your wrist straight during the exercise. Gently bend your fingers into your palm until the tips of your fingers are touching the middle of your palm. Hold this position for __________ seconds. Extend your fingers back to the starting position, stretching every joint fully. Repeat  this exercise 5-10 times with each hand. Straight fist Stand or sit with your arm, hand, and all five fingers pointed straight up. Make sure to keep your wrist straight during the exercise. Gently bend your fingers at the big knuckle, where your fingers meet your hand, and the middle knuckle. Keep the knuckle at the tips of your fingers straight and try to touch the bottom of your palm. Hold this position for __________ seconds. Extend your fingers back to the starting position, stretching every joint fully. Repeat this exercise 5-10 times with each hand. Tabletop  Stand or sit with your arm, hand, and all five fingers pointed straight up. Make sure to keep your wrist straight during the exercise. Gently bend your fingers at the big knuckle, where your fingers meet your hand, as far down as you can while keeping the small knuckles in your fingers straight. Think of forming a tabletop with your fingers. Hold this position for __________ seconds. Extend your fingers back to the starting position, stretching every joint fully. Repeat this exercise 5-10 times with each hand. Finger spread  Place your hand flat on a table with your palm facing down. Make sure your wrist stays straight as you do this exercise. Spread your fingers and thumb apart from each other as far as you can until you feel a gentle stretch. Hold this position for __________ seconds. Bring your fingers and thumb tight together again. Hold this position for __________ seconds. Repeat this exercise 5-10 times with each hand. Making circles  Stand or sit with your arm, hand, and all five fingers pointed   straight up. Make sure to keep your wrist straight during the exercise. Make a circle by touching the tip of your thumb to the tip of your index finger. Hold for __________ seconds. Then open your hand wide. Repeat this motion with your thumb and each finger on your hand. Repeat this exercise 5-10 times with each hand. Thumb  motion  Sit with your forearm resting on a table and your wrist straight. Your thumb should be facing up toward the ceiling. Keep your fingers relaxed as you move your thumb. Lift your thumb up as high as you can toward the ceiling. Hold for __________ seconds. Bend your thumb across your palm as far as you can, reaching the tip of your thumb for the small finger (pinkie) side of your palm. Hold for __________ seconds. Repeat this exercise 5-10 times with each hand. Grip strengthening  Hold a stress ball or other soft ball in the middle of your hand. Slowly increase the pressure, squeezing the ball as much as you can without causing pain. Think of bringing the tips of your fingers into the middle of your palm. All of your finger joints should bend when doing this exercise. Hold your squeeze for __________ seconds, then relax. Repeat this exercise 5-10 times with each hand. Contact a health care provider if: Your hand pain or discomfort gets much worse when you do an exercise. Your hand pain or discomfort does not improve within 2 hours after you exercise. If you have any of these problems, stop doing these exercises right away. Do not do them again unless your health care provider says that you can. Get help right away if: You develop sudden, severe hand pain or swelling. If this happens, stop doing these exercises right away. Do not do them again unless your health care provider says that you can. This information is not intended to replace advice given to you by your health care provider. Make sure you discuss any questions you have with your health care provider. Document Revised: 04/09/2020 Document Reviewed: 04/09/2020 Elsevier Patient Education  2022 [employer]  

## 2020-09-08 NOTE — Progress Notes (Signed)
Please call the patient to review x-ray results.  X-rays of both hands were consistent with osteoarthritis.  She has severe CMC joint space narrowing bilaterally. Please advise the patient to notify us if her left [facility] joint pain persists or worsens and she can return for an ultrasound-guided cortisone injection in the future.

## 2020-09-24 ENCOUNTER — Other Ambulatory Visit: Payer: Self-pay | Admitting: Rheumatology

## 2020-09-24 NOTE — Telephone Encounter (Signed)
Next Visit: 03/03/2021  Last Visit: 09/03/2020  Last Fill: 06/29/2020  Dx:  Fibromyalgia   Current Dose per office note on 09/03/2020: Cymbalta 30 mg 1 capsule daily   Okay to refill Cymbalta?

## 2020-10-20 ENCOUNTER — Ambulatory Visit (INDEPENDENT_AMBULATORY_CARE_PROVIDER_SITE_OTHER): Payer: Medicare HMO | Admitting: Internal Medicine

## 2020-10-20 ENCOUNTER — Other Ambulatory Visit: Payer: Self-pay

## 2020-10-20 ENCOUNTER — Encounter (INDEPENDENT_AMBULATORY_CARE_PROVIDER_SITE_OTHER): Payer: Self-pay | Admitting: Internal Medicine

## 2020-10-20 VITALS — BP 138/76 | HR 91 | Temp 98.2°F | Ht 63.0 in | Wt 195.7 lb

## 2020-10-20 DIAGNOSIS — K50118 Crohn's disease of large intestine with other complication: Secondary | ICD-10-CM | POA: Diagnosis not present

## 2020-10-20 DIAGNOSIS — K5010 Crohn's disease of large intestine without complications: Secondary | ICD-10-CM

## 2020-10-20 DIAGNOSIS — R110 Nausea: Secondary | ICD-10-CM

## 2020-10-20 MED ORDER — ONDANSETRON HCL 4 MG PO TABS: 4.0000 mg | ORAL_TABLET | Freq: Two times a day (BID) | ORAL | 0 refills | Status: DC | PRN

## 2020-10-20 NOTE — Patient Instructions (Signed)
Please keep track of nausea episodes.  Would like to know it occurs before or after meals or if it occurs after you take your medications. Physician will call with results of fecal calprotectin when completed.

## 2020-10-20 NOTE — Progress Notes (Addendum)
Presenting complaint;  Follow-up Crohn's colitis diarrhea.  Database and subjective:  Patient is 66 old Caucasian female with history of Crohn's colitis she was diagnosed in 2014.  Last colonoscopy was in December 2019 when she was noted to have cecal erosions and scar at hepatic flexure and diverticulosis without diverticulitis.  Cecal biopsy revealed changes consistent with Crohn's disease.  She has been maintained on oral mesalamine.  She also has history of diarrhea often than constipation felt to be due to IBS.  She was last seen on 06/23/2020 for abdominal pain and diarrhea.  Hemoccult was negative.  CBC and comprehensive chemistry panel were normal.  CRP was also normal at 1.5.  GI pathogen panel was negative as well.  Fecal calprotectin was also ordered but not completed because her diarrhea resolved.  She was begun on dicyclomine.  Patient states she is doing well.  She has intermittent nausea without vomiting.  She has not noted any pattern to nausea.  She says her appetite is good.  She is using dicyclomine no more than 1-3 times per week.  She does not have any side effects.  She takes Tylenol maybe once or twice a week.  She says she has been on Fosamax for 3 to 4 years and has not had any problems.  She takes tramadol for fibromyalgia. She is not having 1 formed stool daily.  She has noted bright red blood with just about every bowel movement but it is small to moderate in ever large volume. Patient states she is trying to exercise regularly.  She walks at least 3-4 times a week and 30 minutes each time.  Current Medications: Outpatient Encounter Medications as of 10/20/2020  Medication Sig   acetaminophen (TYLENOL) 500 MG tablet Take 1,000 mg by mouth every 6 (six) hours as needed.   alendronate (FOSAMAX) 70 MG tablet Take 70 mg by mouth every Friday. Take with a full glass of water on an empty stomach.   BREO ELLIPTA 100-25 MCG/INH AEPB Inhale 1 puff into the lungs daily.     CALCIUM GLUCONATE PO Take 600 mg by mouth 2 (two) times daily.    cetirizine (ZYRTEC) 10 MG tablet Take 10 mg by mouth daily.   cholecalciferol (VITAMIN D) 1000 units tablet Take 1,000 Units by mouth 2 (two) times daily.    dicyclomine (BENTYL) 10 MG capsule TAKE 1 CAPSULE (10 MG TOTAL) BY MOUTH 3 (THREE) TIMES DAILY AS NEEDED FOR SPASMS.   DULoxetine (CYMBALTA) 30 MG capsule TAKE 1 CAPSULE BY MOUTH EVERY DAY   gemfibrozil (LOPID) 600 MG tablet Take 600 mg by mouth 2 (two) times daily before a meal.   levothyroxine (SYNTHROID, LEVOTHROID) 125 MCG tablet Take 125 mcg by mouth daily.   lisinopril (ZESTRIL) 2.5 MG tablet Take 2.5 mg by mouth daily.   Mesalamine (ASACOL) 400 MG CPDR DR capsule Take 2 capsules (800 mg total) by mouth 2 (two) times daily.   pregabalin (LYRICA) 50 MG capsule Take 1 capsule (50 mg total) by mouth 2 (two) times daily.   PROAIR HFA 108 (90 Base) MCG/ACT inhaler Inhale 1-2 puffs into the lungs every 6 (six) hours as needed for wheezing or shortness of breath.    rosuvastatin (CRESTOR) 10 MG tablet Take 10 mg by mouth daily.   tiZANidine (ZANAFLEX) 4 MG tablet Take 1 tablet (4 mg total) by mouth at bedtime as needed for muscle spasms.   traMADol (ULTRAM) 50 MG tablet Take 1 tablet (50 mg total) by mouth 3 (  three) times daily as needed.   No facility-administered encounter medications on file as of 10/20/2020.     Objective: Blood pressure 138/76, pulse 91, temperature 98.2 F (36.8 C), temperature source Oral, height 5' 3"  (1.6 m), weight 195 lb 11.2 oz (88.8 kg). Patient is alert and in no acute distress. Conjunctiva is pink. Sclera is nonicteric Oropharyngeal mucosa is normal. No neck masses or thyromegaly noted. Cardiac exam with regular rhythm normal S1 and S2. No murmur or gallop noted. Lungs are clear to auscultation. Abdomen is full.  Bowel sounds are normal.  On palpation abdomen is soft and nontender with organomegaly or masses. No LE edema or clubbing  noted.  Labs/studies Results:   CBC Latest Ref Rng & Units 06/23/2020 10/11/2017 09/01/2017  WBC 3.8 - 10.8 Thousand/uL 7.5 6.7 6.9  Hemoglobin 11.7 - 15.5 g/dL 14.0 12.2 12.5  Hematocrit 35.0 - 45.0 % 42.1 35.9 38.4  Platelets 140 - 400 Thousand/uL 254 234 211    CMP Latest Ref Rng & Units 06/23/2020 09/01/2017 12/14/2016  Glucose 65 - 99 mg/dL 122(H) 120(H) 115(H)  BUN 7 - 25 mg/dL 15 14 12   Creatinine 0.50 - 0.99 mg/dL 0.82 0.79 0.67  Sodium 135 - 146 mmol/L 139 141 137  Potassium 3.5 - 5.3 mmol/L 4.7 3.9 3.7  Chloride 98 - 110 mmol/L 103 107 106  CO2 20 - 32 mmol/L 26 26 26   Calcium 8.6 - 10.4 mg/dL 10.2 9.4 8.2(L)  Total Protein 6.1 - 8.1 g/dL 7.7 7.5 6.1(L)  Total Bilirubin 0.2 - 1.2 mg/dL 0.4 0.6 0.7  Alkaline Phos 38 - 126 U/L - 64 61  AST 10 - 35 U/L 35 28 12(L)  ALT 6 - 29 U/L 24 22 10(L)    Hepatic Function Latest Ref Rng & Units 06/23/2020 09/01/2017 12/14/2016  Total Protein 6.1 - 8.1 g/dL 7.7 7.5 6.1(L)  Albumin 3.5 - 5.0 g/dL - 4.1 3.0(L)  AST 10 - 35 U/L 35 28 12(L)  ALT 6 - 29 U/L 24 22 10(L)  Alk Phosphatase 38 - 126 U/L - 64 61  Total Bilirubin 0.2 - 1.2 mg/dL 0.4 0.6 0.7  Bilirubin, Direct 0.1 - 0.5 mg/dL - - -    Lab Results  Component Value Date   CRP 1.5 06/23/2020      Assessment:  #1.  Recent onset of diarrhea in a patient with known Crohn's colitis.  GI pathogen panel was negative.  CRP was unremarkable.  Diarrhea has resolved.  She is using dicyclomine on as-needed basis.  I suspect she also has IBS.  #2.  History of Crohn's colitis.  Disease duration 8 years.  Last colonoscopy was in December 2019 and if she continues to do well next exam would be in December 2024.  She will continue oral mesalamine.  #3.  Nausea.  Nausea is intermittent and not associated with vomiting.  Will monitor trend and determine if further work-up is needed.  It may be related to one of her medications.  #4.  Hematochezia appears to be secondary to hemorrhoids.   Colonoscopy in December 2019 revealed external hemorrhoids.  She should refrain from straining and may consider fiber supplement.  If bleeding continues would consider referral for banding or injection therapy.   Plan:  Patient advised to monitor symptoms of nausea for the next few weeks and asked to the occurrence in relationship to meals or medications and how long it lasts.   Ondansetron 4 mg p.o. 3 times daily as needed. She  will try to take stool specimen to the lab for fecal calprotectin. Office visit in 6 months  Prior colonoscopy records from [facility], interval reviewed   Colonoscopy 11/11/2011 by Dr. Doristine Mango Mild left-sided diverticulosis sessile polyp at splenic flexure removed by cold biopsy Multiple colonic ulcers involving ileocecal valve ascending and transverse colon.  TI could not be intubated. Polyp was inflammatory.  Biopsy from ascending colon reveals acute colitis and nonspecific features.  Rectal biopsy normal mucosa.  Colonoscopy on 02/03/2012 by Dr. Doristine Mango  Mild mild left-sided diverticulosis Normal terminal ileum 2 polyps removed from proximal transverse colo by cold biopsy Inflammation noted involving ileocecal valve and ascending colon hepatic flexure and proximal transverse colon.  Biopsies taken. Pathology revealed normal terminal ileum normal ileocecal valve and ascending colon acute colitis with granulation tissue at hepatic flexure.  Polyp biopsy reveals unremarkable tissue and transverse colon also revealed acute colitis with granulation tissue.

## 2020-11-10 ENCOUNTER — Other Ambulatory Visit (INDEPENDENT_AMBULATORY_CARE_PROVIDER_SITE_OTHER): Payer: Self-pay

## 2020-11-10 ENCOUNTER — Other Ambulatory Visit (INDEPENDENT_AMBULATORY_CARE_PROVIDER_SITE_OTHER): Payer: Self-pay | Admitting: Internal Medicine

## 2020-11-10 DIAGNOSIS — K50118 Crohn's disease of large intestine with other complication: Secondary | ICD-10-CM | POA: Diagnosis not present

## 2020-11-10 DIAGNOSIS — K5010 Crohn's disease of large intestine without complications: Secondary | ICD-10-CM

## 2020-11-10 MED ORDER — MESALAMINE 400 MG PO CPDR: 800.0000 mg | DELAYED_RELEASE_CAPSULE | Freq: Two times a day (BID) | ORAL | 3 refills | Status: DC

## 2020-11-13 ENCOUNTER — Encounter (INDEPENDENT_AMBULATORY_CARE_PROVIDER_SITE_OTHER): Payer: Self-pay

## 2020-11-17 ENCOUNTER — Other Ambulatory Visit: Payer: Self-pay

## 2020-11-17 ENCOUNTER — Encounter: Payer: Self-pay | Admitting: Physical Medicine & Rehabilitation

## 2020-11-17 ENCOUNTER — Encounter: Payer: Medicare HMO | Attending: Physical Medicine & Rehabilitation | Admitting: Physical Medicine & Rehabilitation

## 2020-11-17 VITALS — BP 130/84 | HR 91 | Temp 98.6°F | Ht 63.0 in | Wt 202.4 lb

## 2020-11-17 DIAGNOSIS — M797 Fibromyalgia: Secondary | ICD-10-CM | POA: Insufficient documentation

## 2020-11-17 MED ORDER — TRAMADOL HCL 50 MG PO TABS: 50.0000 mg | ORAL_TABLET | Freq: Three times a day (TID) | ORAL | 5 refills | Status: DC | PRN

## 2020-11-17 MED ORDER — PREGABALIN 50 MG PO CAPS: 50.0000 mg | ORAL_CAPSULE | Freq: Two times a day (BID) | ORAL | 5 refills | Status: DC

## 2020-11-17 NOTE — Patient Instructions (Signed)
Neck Exercises Ask your health care provider which exercises are safe for you. Do exercises exactly as told by your health care provider and adjust them as directed. It is normal to feel mild stretching, pulling, tightness, or discomfort as you do these exercises. Stop right away if you feel sudden pain or your pain gets worse. Do not begin these exercises until told by your health care provider. Neck exercises can be important for many reasons. They can improve strength and maintain flexibility in your neck, which will help your upper back and prevent neck pain. Stretching exercises Rotation neck stretching  Sit in a chair or stand up. Place your feet flat on the floor, shoulder-width apart. Slowly turn your head (rotate) to the right until a slight stretch is felt. Turn it all the way to the right so you can look over your right shoulder. Do not tilt or tip your head. Hold this position for 10-30 seconds. Slowly turn your head (rotate) to the left until a slight stretch is felt. Turn it all the way to the left so you can look over your left shoulder. Do not tilt or tip your head. Hold this position for 10-30 seconds. Repeat __________ times. Complete this exercise __________ times a day. Neck retraction  Sit in a sturdy chair or stand up. Look straight ahead. Do not bend your neck. Use your fingers to push your chin backward (retraction). Do not bend your neck for this movement. Continue to face straight ahead. If you are doing the exercise properly, you will feel a slight sensation in your throat and a stretch at the back of your neck. Hold the stretch for 1-2 seconds. Repeat __________ times. Complete this exercise __________ times a day. Strengthening exercises Neck press  Lie on your back on a firm bed or on the floor with a pillow under your head. Use your neck muscles to push your head down on the pillow and straighten your spine. Hold the position as well as you can. Keep your head  facing up (in a neutral position) and your chin tucked. Slowly count to 5 while holding this position. Repeat __________ times. Complete this exercise __________ times a day. Isometrics These are exercises in which you strengthen the muscles in your neck while keeping your neck still (isometrics). Sit in a supportive chair and place your hand on your forehead. Keep your head and face facing straight ahead. Do not flex or extend your neck while doing isometrics. Push forward with your head and neck while pushing back with your hand. Hold for 10 seconds. Do the sequence again, this time putting your hand against the back of your head. Use your head and neck to push backward against the hand pressure. Finally, do the same exercise on either side of your head, pushing sideways against the pressure of your hand. Repeat __________ times. Complete this exercise __________ times a day. Prone head lifts  Lie face-down (prone position), resting on your elbows so that your chest and upper back are raised. Start with your head facing downward, near your chest. Position your chin either on or near your chest. Slowly lift your head upward. Lift until you are looking straight ahead. Then continue lifting your head as far back as you can comfortably stretch. Hold your head up for 5 seconds. Then slowly lower it to your starting position. Repeat __________ times. Complete this exercise __________ times a day. Supine head lifts  Lie on your back (supine position), bending your knees  to point to the ceiling and keeping your feet flat on the floor. Lift your head slowly off the floor, raising your chin toward your chest. Hold for 5 seconds. Repeat __________ times. Complete this exercise __________ times a day. Scapular retraction  Stand with your arms at your sides. Look straight ahead. Slowly pull both shoulders (scapulae) backward and downward (retraction) until you feel a stretch between your shoulder  blades in your upper back. Hold for 10-30 seconds. Relax and repeat. Repeat __________ times. Complete this exercise __________ times a day. Contact a health care provider if: Your neck pain or discomfort gets worse when you do an exercise. Your neck pain or discomfort does not improve within 2 hours after you exercise. If you have any of these problems, stop exercising right away. Do not do the exercises again unless your health care provider says that you can. Get help right away if: You develop sudden, severe neck pain. If this happens, stop exercising right away. Do not do the exercises again unless your health care provider says that you can. This information is not intended to replace advice given to you by your health care provider. Make sure you discuss any questions you have with your health care provider. Document Revised: 06/16/2020 Document Reviewed: 06/16/2020 Elsevier Patient Education  [employer].

## 2020-11-17 NOTE — Progress Notes (Signed)
Subjective:    Patient ID: Desiree Hogan, female    DOB: 07-26-1955, 65 y.o.   MRN: [id number] female referred for the evaluation of chronic pain complaints by her rheumatologist.  The patient complains of pain in multiple areas including low back, buttocks, bilateral knees, bilateral hands, and her neck.  She actually points to the trapezius area instead of the neck itself. She has a history of lumbar fusion L3-4 L4-5 as well as a history of left lumbar radiculopathy.  The patient had her last spine surgery in 2011 by Dr. Sherwood Gambler.  There was a removal of L3-L4 interbody implant as well as left L3 pedicle screw. The patient's low back pain is bilateral.  It is more below the waist then above the waist. She has no pain radiating down the legs.  She does have bilateral knee pain but this does not feel like it is connected. HPI Patient with history of fibromyalgia has been on both Lyrica and duloxetine as well as tramadol.  PCP is prescribing duloxetine Still Indep with ADLs including complex as well as driving  Pain Inventory Average Pain 6 Pain Right Now 4 My pain is intermittent and aching  In the last 24 hours, has pain interfered with the following? General activity 0 Relation with others 0 Enjoyment of life 1 What TIME of day is your pain at its worst? morning  Sleep (in general) Fair  Pain is worse with: some activites Pain improves with: medication and injections Relief from Meds: 3  Family History  Problem Relation Age of Onset   Cancer Mother        Colon or vaginal ?   Deep vein thrombosis Mother    Hypertension Father    Diabetes Father    Heart disease Father        Heart Disease before age 58   Diabetes Sister    Diabetes Brother    Hypertension Brother    Healthy Son    Healthy Daughter    Healthy Daughter    Healthy Daughter    Social History   Socioeconomic History   Marital status: Married    Spouse name: Not on file   Number of children: Not on  file   Years of education: Not on file   Highest education level: Not on file  Occupational History   Not on file  Tobacco Use   Smoking status: Former    Packs/day: 0.75    Years: 20.00    Pack years: 15.00    Types: Cigarettes    Quit date: 01/03/2009    Years since quitting: 11.8   Smokeless tobacco: Never  Vaping Use   Vaping Use: Never used  Substance and Sexual Activity   Alcohol use: No   Drug use: No   Sexual activity: Not on file  Other Topics Concern   Not on file  Social History Narrative   Not on file   Social Determinants of Health   Financial Resource Strain: Not on file  Food Insecurity: Not on file  Transportation Needs: Not on file  Physical Activity: Not on file  Stress: Not on file  Social Connections: Not on file   Past Surgical History:  Procedure Laterality Date   BIOPSY  12/20/2017   Procedure: BIOPSY;  Surgeon: Rogene Houston, MD;  [facility];  Service: Endoscopy;;  cecal erosions   CESAREAN SECTION  06/24/82   CHOLECYSTECTOMY  1993   Gall Bladder   COLONOSCOPY  Jan. 31, 2014   COLONOSCOPY N/A 12/20/2017   Procedure: COLONOSCOPY;  Surgeon: Rogene Houston, MD;  [facility];  Service: Endoscopy;  Laterality: N/A;  2:25   [place]  2010 and 2011   TONSILLECTOMY     Past Surgical History:  Procedure Laterality Date   BIOPSY  12/20/2017   Procedure: BIOPSY;  Surgeon: Rogene Houston, MD;  [facility];  Service: Endoscopy;;  cecal erosions   CESAREAN SECTION  06/24/82   [place] Bladder   COLONOSCOPY  Jan. 31, 2014   COLONOSCOPY N/A 12/20/2017   Procedure: COLONOSCOPY;  Surgeon: Rogene Houston, MD;  [facility];  Service: Endoscopy;  Laterality: N/A;  2:25   [place]  2010 and 2011   TONSILLECTOMY     Past Medical History:  Diagnosis Date   Abdominal pain    Anemia    Back pain    Colitis    COPD (chronic obstructive pulmonary disease) (HCC)    not on  home o2   Crohn's disease ([place]) 03/29/12   Diabetes mellitus without complication (HCC)    Fibromyalgia    H/O breast biopsy    Hip pain    Hyperlipidemia    Hypothyroidism    Irritable bowel syndrome    Joint pain    Nonspecific abnormal finding in stool contents    Peripheral vascular disease ([place])    Ulcer    Mouth   There were no vitals taken for this visit.  Opioid Risk Score:   Fall Risk Score:  `1  Depression screen PHQ 2/9  Depression screen [facility] 2/9 06/16/2020 10/24/2019 03/26/2019  Decreased Interest 0 1 2  Down, Depressed, Hopeless 0 1 1  PHQ - 2 Score 0 2 3  Altered sleeping - - 2  Tired, decreased energy - - 2  Change in appetite - - 0  Feeling bad or failure about yourself  - - 0  Trouble concentrating - - 2  Moving slowly or fidgety/restless - - 1  Suicidal thoughts - - 0  PHQ-9 Score - - 10    Review of Systems  Musculoskeletal:  Positive for back pain.       Left shoulder, left knee pain  All other systems reviewed and are negative.     Objective:   Physical Exam Constitutional:      Appearance: She is obese.  HENT:     Head: Normocephalic and atraumatic.  Eyes:     Extraocular Movements: Extraocular movements intact.     Conjunctiva/sclera: Conjunctivae normal.     Pupils: Pupils are equal, round, and reactive to light.  Musculoskeletal:     Comments: Patient is reduced bilateral shoulder range of motion. Cervical spine 75% range, lumbar spine 0 25% range with flexion extension.  Skin:    General: Skin is warm and dry.  Neurological:     General: No focal deficit present.     Mental Status: She is alert and oriented to person, place, and time.  Psychiatric:        Mood and Affect: Mood normal.        Behavior: Behavior normal.   Ambulates without assistive device no evidence of toe drag or knee instability Upper extremity strength 5/5 bilateral deltoid, bicep, tricep, grip, hip flexor, knee plantar flexor       Assessment & Plan:    1.  Fibromyalgia syndrome compounded by debility from limited physical activity.  She is able to complete her own ADLs as well as household tasks.  She would benefit from aquatic therapy but declines referral at this time. The patient has remained functional on the following medication regimen Tramadol 50 mg 3 times daily Lyrica 50 mg twice daily PDMP reviewed No signs of aberrant drug behavior Will continue current dosing Recheck in 6 months, nurse practitioner Last urine drug screen was February 2022 and was consistent recheck at next visit

## 2020-11-19 LAB — CALPROTECTIN: Calprotectin: 152 mcg/g — ABNORMAL HIGH

## 2020-12-04 ENCOUNTER — Other Ambulatory Visit: Payer: Self-pay | Admitting: Physician Assistant

## 2020-12-04 NOTE — Telephone Encounter (Signed)
Next Visit: 03/03/2021   Last Visit: 09/03/2020   Last Fill: 09/03/2020   Dx:  DDD (degenerative disc disease), lumbar   Current Dose per office note on 09/03/2020:  tizanidine 4 mg at bedtime for muscle spasms   Okay to refill Tizanidine?

## 2020-12-20 ENCOUNTER — Other Ambulatory Visit: Payer: Self-pay | Admitting: Physician Assistant

## 2020-12-21 NOTE — Telephone Encounter (Signed)
Next Visit: 03/03/2021   Last Visit: 09/03/2020   Last Fill: 09/24/2020   Dx:  Fibromyalgia    Current Dose per office note on 09/03/2020: Cymbalta 30 mg 1 capsule daily    Okay to refill Cymbalta?

## 2021-01-01 DIAGNOSIS — E785 Hyperlipidemia, unspecified: Secondary | ICD-10-CM | POA: Diagnosis not present

## 2021-01-15 DIAGNOSIS — J449 Chronic obstructive pulmonary disease, unspecified: Secondary | ICD-10-CM | POA: Diagnosis not present

## 2021-01-15 DIAGNOSIS — Z299 Encounter for prophylactic measures, unspecified: Secondary | ICD-10-CM | POA: Diagnosis not present

## 2021-01-15 DIAGNOSIS — R69 Illness, unspecified: Secondary | ICD-10-CM | POA: Diagnosis not present

## 2021-01-15 DIAGNOSIS — I700 Atherosclerosis of aorta: Secondary | ICD-10-CM | POA: Diagnosis not present

## 2021-01-15 DIAGNOSIS — J329 Chronic sinusitis, unspecified: Secondary | ICD-10-CM | POA: Diagnosis not present

## 2021-01-25 DIAGNOSIS — Z789 Other specified health status: Secondary | ICD-10-CM | POA: Diagnosis not present

## 2021-01-25 DIAGNOSIS — Z299 Encounter for prophylactic measures, unspecified: Secondary | ICD-10-CM | POA: Diagnosis not present

## 2021-01-25 DIAGNOSIS — E1165 Type 2 diabetes mellitus with hyperglycemia: Secondary | ICD-10-CM | POA: Diagnosis not present

## 2021-01-25 DIAGNOSIS — J449 Chronic obstructive pulmonary disease, unspecified: Secondary | ICD-10-CM | POA: Diagnosis not present

## 2021-01-25 DIAGNOSIS — I10 Essential (primary) hypertension: Secondary | ICD-10-CM | POA: Diagnosis not present

## 2021-02-17 ENCOUNTER — Other Ambulatory Visit (INDEPENDENT_AMBULATORY_CARE_PROVIDER_SITE_OTHER): Payer: Self-pay | Admitting: *Deleted

## 2021-02-17 ENCOUNTER — Other Ambulatory Visit (INDEPENDENT_AMBULATORY_CARE_PROVIDER_SITE_OTHER): Payer: Self-pay | Admitting: Internal Medicine

## 2021-02-17 DIAGNOSIS — R195 Other fecal abnormalities: Secondary | ICD-10-CM

## 2021-02-17 DIAGNOSIS — K5010 Crohn's disease of large intestine without complications: Secondary | ICD-10-CM

## 2021-02-18 NOTE — Progress Notes (Deleted)
? ?Office Visit Note ? ?Patient: Desiree Hogan             ?Date of Birth: 10/25/1955           ?MRN: [id number]             ?PCP: Glenda Chroman, MD ?Referring: Glenda Chroman, MD ?Visit Date: 03/03/2021 ?Occupation: @GUAR[employer] @ ? ?Subjective:  ?No chief complaint on file. ? ? ?History of Present Illness: Desiree Hogan is a 66 y.o. female ***  ? ?Activities of Daily Living:  ?Patient reports morning stiffness for *** {minute/hour:19697}.   ?Patient {ACTIONS;DENIES/REPORTS:21021675::"Denies"} nocturnal pain.  ?Difficulty dressing/grooming: {ACTIONS;DENIES/REPORTS:21021675::"Denies"} ?Difficulty climbing stairs: {ACTIONS;DENIES/REPORTS:21021675::"Denies"} ?Difficulty getting out of chair: {ACTIONS;DENIES/REPORTS:21021675::"Denies"} ?Difficulty using hands for taps, buttons, cutlery, and/or writing: {ACTIONS;DENIES/REPORTS:21021675::"Denies"} ? ?No Rheumatology ROS completed.  ? ?PMFS History:  ?Patient Active Problem List  ? Diagnosis Date Noted  ? Nausea without vomiting 10/20/2020  ? Abdominal pain 07/17/2020  ? Diarrhea 06/23/2020  ? Diverticulitis of colon 10/11/2017  ? Crohn's colitis ([place]) 10/11/2017  ? Fibromyalgia 07/14/2017  ? ANA positive 07/14/2017  ? Primary osteoarthritis of both knees 07/14/2017  ? DDD (degenerative disc disease), lumbar 07/14/2017  ? Hypermobility of joint 07/14/2017  ? Age-related osteoporosis without current pathological fracture 07/14/2017  ? Other insomnia 07/14/2017  ? History of Crohn's disease 07/14/2017  ? History of IBS 07/14/2017  ? History of hypercholesterolemia 07/14/2017  ? History of COPD 07/14/2017  ? History of cholecystectomy 07/14/2017  ? Community acquired pneumonia 12/13/2016  ? Pneumonia 12/13/2016  ? Leukocytosis 12/13/2016  ? Hypokalemia 12/13/2016  ? Diabetes mellitus without complication ([place]) 81/15/7262  ?  ?Past Medical History:  ?Diagnosis Date  ? Abdominal pain   ? Anemia   ? Back pain   ? Colitis   ? COPD (chronic obstructive pulmonary disease) ([place])   ? not  on home o2  ? Crohn's disease ([place]) 03/29/12  ? Diabetes mellitus without complication ([place])   ? Fibromyalgia   ? H/O breast biopsy   ? Hip pain   ? Hyperlipidemia   ? Hypothyroidism   ? Irritable bowel syndrome   ? Joint pain   ? Nonspecific abnormal finding in stool contents   ? Peripheral vascular disease ([place])   ? Ulcer   ? Mouth  ?  ?Family History  ?Problem Relation Age of Onset  ? Cancer Mother   ?     Colon or vaginal ?  ? Deep vein thrombosis Mother   ? Hypertension Father   ? Diabetes Father   ? Heart disease Father   ?     Heart Disease before age 15  ? Diabetes Sister   ? Diabetes Brother   ? Hypertension Brother   ? Healthy Son   ? Healthy Daughter   ? Healthy Daughter   ? Healthy Daughter   ? ?Past Surgical History:  ?Procedure Laterality Date  ? BIOPSY  12/20/2017  ? Procedure: BIOPSY;  Surgeon: Rogene Houston, MD;  [facility];  Service: Endoscopy;;  cecal erosions  ? CESAREAN SECTION  06/24/82  ? CHOLECYSTECTOMY  1993  ? Gall Bladder  ? COLONOSCOPY  Jan. 31, 2014  ? COLONOSCOPY N/A 12/20/2017  ? Procedure: COLONOSCOPY;  Surgeon: Rogene Houston, MD;  [facility];  Service: Endoscopy;  Laterality: N/A;  2:25  ? [place] SURGERY  2010 and 2011  ? TONSILLECTOMY    ? ?Social History  ? ?Social History Narrative  ? Not on file  ? ? ?  There is no immunization history on file for this patient.  ? ?Objective: ?Vital Signs: There were no vitals taken for this visit.  ? ?Physical Exam  ? ?Musculoskeletal Exam: *** ? ?CDAI Exam: ?CDAI Score: -- ?Patient Global: --; Provider Global: -- ?Swollen: --; Tender: -- ?Joint Exam 03/03/2021  ? ?No joint exam has been documented for this visit  ? ?There is currently no information documented on the homunculus. Go to the Rheumatology activity and complete the homunculus joint exam. ? ?Investigation: ?No additional findings. ? ?Imaging: ?No results found. ? ?Recent Labs: ?Lab Results  ?Component Value Date  ? WBC 7.5 06/23/2020  ? HGB 14.0 06/23/2020   ? PLT 254 06/23/2020  ? NA 139 06/23/2020  ? K 4.7 06/23/2020  ? CL 103 06/23/2020  ? CO2 26 06/23/2020  ? GLUCOSE 122 (H) 06/23/2020  ? BUN 15 06/23/2020  ? CREATININE 0.82 06/23/2020  ? BILITOT 0.4 06/23/2020  ? ALKPHOS 64 09/01/2017  ? AST 35 06/23/2020  ? ALT 24 06/23/2020  ? PROT 7.7 06/23/2020  ? ALBUMIN 4.1 09/01/2017  ? CALCIUM 10.2 06/23/2020  ? GFRAA >60 09/01/2017  ? ? ?Speciality Comments: No specialty comments available. ? ?Procedures:  ?No procedures performed ?Allergies: Bactrim [sulfamethoxazole-trimethoprim], Penicillins, and Tetracyclines & related  ? ?Assessment / Plan:     ?Visit Diagnoses: No diagnosis found. ? ?Orders: ?No orders of the defined types were placed in this encounter. ? ?No orders of the defined types were placed in this encounter. ? ? ?Face-to-face time spent with patient was *** minutes. Greater than 50% of time was spent in counseling and coordination of care. ? ?Follow-Up Instructions: No follow-ups on file. ? ? ?Earnestine Mealing, CMA ? ?Note - This record has been created using [employer].  ?Chart creation errors have been sought, but may not always  ?have been located. Such creation errors do not reflect on  ?the standard of medical care.  ?

## 2021-03-02 DIAGNOSIS — I10 Essential (primary) hypertension: Secondary | ICD-10-CM | POA: Diagnosis not present

## 2021-03-02 DIAGNOSIS — E785 Hyperlipidemia, unspecified: Secondary | ICD-10-CM | POA: Diagnosis not present

## 2021-03-03 ENCOUNTER — Ambulatory Visit: Payer: Medicare HMO | Admitting: Rheumatology

## 2021-03-03 ENCOUNTER — Other Ambulatory Visit: Payer: Self-pay | Admitting: Physician Assistant

## 2021-03-03 DIAGNOSIS — R768 Other specified abnormal immunological findings in serum: Secondary | ICD-10-CM

## 2021-03-03 DIAGNOSIS — Z8709 Personal history of other diseases of the respiratory system: Secondary | ICD-10-CM

## 2021-03-03 DIAGNOSIS — Z9049 Acquired absence of other specified parts of digestive tract: Secondary | ICD-10-CM

## 2021-03-03 DIAGNOSIS — M62838 Other muscle spasm: Secondary | ICD-10-CM

## 2021-03-03 DIAGNOSIS — G8929 Other chronic pain: Secondary | ICD-10-CM

## 2021-03-03 DIAGNOSIS — M170 Bilateral primary osteoarthritis of knee: Secondary | ICD-10-CM

## 2021-03-03 DIAGNOSIS — Z8719 Personal history of other diseases of the digestive system: Secondary | ICD-10-CM

## 2021-03-03 DIAGNOSIS — M810 Age-related osteoporosis without current pathological fracture: Secondary | ICD-10-CM

## 2021-03-03 DIAGNOSIS — Z8639 Personal history of other endocrine, nutritional and metabolic disease: Secondary | ICD-10-CM

## 2021-03-03 DIAGNOSIS — M5136 Other intervertebral disc degeneration, lumbar region: Secondary | ICD-10-CM

## 2021-03-03 DIAGNOSIS — M797 Fibromyalgia: Secondary | ICD-10-CM

## 2021-03-03 DIAGNOSIS — G4709 Other insomnia: Secondary | ICD-10-CM

## 2021-03-03 DIAGNOSIS — M249 Joint derangement, unspecified: Secondary | ICD-10-CM

## 2021-03-03 NOTE — Telephone Encounter (Signed)
Next Visit: Due March 2023. (Patient cancelled appointment for today due to being sick) ?  ?Last Visit: 09/03/2020 ?  ?Last Fill: 09/24/2020 ?  ?Dx:  DDD (degenerative disc disease), lumbar ?  ?Current Dose per office note on 09/03/2020:tizanidine 4 mg at bedtime for muscle  ? ?Okay to refill Tizanidine?  ?

## 2021-03-19 ENCOUNTER — Other Ambulatory Visit: Payer: Self-pay | Admitting: Physician Assistant

## 2021-03-19 NOTE — Telephone Encounter (Signed)
Next Visit: due 03/2021. Attempted to contact patient and left message on machine for patient to call and schedule.  ? ?Last Visit: 09/03/2020 ? ?Last Fill: 12/21/2020 ? ?DX: Fibromyalgia ? ?Current Dose per office note on 09/03/2020: Cymbalta 30 mg 1 capsule daily ? ?Okay to refill cymbalta?  ?

## 2021-03-24 ENCOUNTER — Telehealth (INDEPENDENT_AMBULATORY_CARE_PROVIDER_SITE_OTHER): Payer: Self-pay | Admitting: *Deleted

## 2021-03-24 NOTE — Telephone Encounter (Signed)
03/12/2021: I called and left a vm asked that she please have the labs done. CLS ?2/15 - order put in and left message to return call ?2/21 [facility] ?2/22 - lab order mailed with a note to do for dr Laural Golden and call office if questions since not able to reach by phone. ? ?

## 2021-03-24 NOTE — Telephone Encounter (Signed)
-----   Message from Carmelina Noun, LPN sent at 24/82/5003  3:08 PM EST ----- ?Regarding: labs ?Dr Laural Golden - Fecal calprotectin mildly elevated.  ?Will repeat fecal calprotectin in 3 months. ( Around 02/25/21)  ? ? ?

## 2021-04-06 NOTE — Telephone Encounter (Signed)
error 

## 2021-04-20 ENCOUNTER — Encounter (INDEPENDENT_AMBULATORY_CARE_PROVIDER_SITE_OTHER): Payer: Self-pay | Admitting: Internal Medicine

## 2021-04-20 ENCOUNTER — Ambulatory Visit (INDEPENDENT_AMBULATORY_CARE_PROVIDER_SITE_OTHER): Payer: Medicare HMO | Admitting: Internal Medicine

## 2021-05-14 ENCOUNTER — Ambulatory Visit: Payer: Medicare HMO | Admitting: Registered Nurse

## 2021-05-16 ENCOUNTER — Other Ambulatory Visit: Payer: Self-pay | Admitting: Physician Assistant

## 2021-05-30 ENCOUNTER — Other Ambulatory Visit: Payer: Self-pay | Admitting: Rheumatology

## 2021-06-04 NOTE — Progress Notes (Signed)
Office Visit Note  Patient: Desiree Hogan             Date of Birth: 1955-04-25           MRN: [id number]             PCP: Glenda Chroman, MD Referring: Glenda Chroman, MD Visit Date: 06/07/2021 Occupation: @GUAROCC @  Subjective:  Pain in both knees   History of Present Illness: Desiree Hogan is a 66 y.o. female with history of fibromyalgia, osteoarthritis, and osteoporosis.  She remains on Cymbalta, Lyrica, tramadol, and tizanidine for symptomatic relief.  She continues to have generalized myalgias and muscle tenderness due to fibromyalgia.  She has had less trapezius muscle tension and tenderness recently.  Zanaflex has been helpful to alleviate her symptoms.  She requested a refill to be sent to the pharmacy today.  She presents today with increased pain in both knees which has progressively been worsening over the past 1 month.  She has not had any recent injury or fall.  She denies mechanical symptoms.  She has tried using Voltaren gel topically as needed but has not tried a brace or ice.  She denies any warmth or swelling in her knees.  She states that she been having an aching sensation at night which has caused interrupted sleep at night.  She requested a right knee joint cortisone injection today.   Activities of Daily Living:  Patient reports morning stiffness for 2 hours.   Patient Reports nocturnal pain.  Difficulty dressing/grooming: Denies Difficulty climbing stairs: Denies Difficulty getting out of chair: Denies Difficulty using hands for taps, buttons, cutlery, and/or writing: Denies  Review of Systems  Constitutional:  Positive for fatigue.  HENT:  Positive for mouth dryness.   Eyes:  Positive for itching.  Respiratory:  Positive for shortness of breath.   Cardiovascular:  Negative for swelling in legs/feet.  Gastrointestinal:  Negative for constipation.  Endocrine: Positive for heat intolerance and excessive thirst.  Genitourinary:  Negative for difficulty  urinating.  Musculoskeletal:  Positive for joint pain, joint pain, joint swelling, muscle weakness, morning stiffness and muscle tenderness.  Skin:  Negative for rash.  Allergic/Immunologic: Positive for susceptible to infections.  Neurological:  Positive for numbness and weakness.  Hematological:  Negative for bruising/bleeding tendency.  Psychiatric/Behavioral:  Positive for sleep disturbance.    PMFS History:  Patient Active Problem List   Diagnosis Date Noted   Nausea without vomiting 10/20/2020   Abdominal pain 07/17/2020   Diarrhea 06/23/2020   Diverticulitis of colon 10/11/2017   Crohn's colitis ([place]) 10/11/2017   Fibromyalgia 07/14/2017   ANA positive 07/14/2017   Primary osteoarthritis of both knees 07/14/2017   DDD (degenerative disc disease), lumbar 07/14/2017   Hypermobility of joint 07/14/2017   Age-related osteoporosis without current pathological fracture 07/14/2017   Other insomnia 07/14/2017   History of Crohn's disease 07/14/2017   History of IBS 07/14/2017   History of hypercholesterolemia 07/14/2017   History of COPD 07/14/2017   History of cholecystectomy 07/14/2017   Community acquired pneumonia 12/13/2016   Pneumonia 12/13/2016   Leukocytosis 12/13/2016   Hypokalemia 12/13/2016   Diabetes mellitus without complication ([place]) 78/29/5621    Past Medical History:  Diagnosis Date   Abdominal pain    Anemia    Back pain    Colitis    COPD (chronic obstructive pulmonary disease) ([place])    not on home o2   Crohn's disease ([place]) 03/29/12   Diabetes mellitus without complication (  [place])    Fibromyalgia    H/O breast biopsy    Hip pain    Hyperlipidemia    Hypothyroidism    Irritable bowel syndrome    Joint pain    Nonspecific abnormal finding in stool contents    Peripheral vascular disease ([place])    Ulcer    Mouth    Family History  Problem Relation Age of Onset   Cancer Mother        Colon or vaginal ?   Deep vein thrombosis Mother     Hypertension Father    Diabetes Father    Heart disease Father        Heart Disease before age 53   Diabetes Sister    Diabetes Brother    Hypertension Brother    Healthy Son    Healthy Daughter    Healthy Daughter    Healthy Daughter    Past Surgical History:  Procedure Laterality Date   BIOPSY  12/20/2017   Procedure: BIOPSY;  Surgeon: Rogene Houston, MD;  [facility];  Service: Endoscopy;;  cecal erosions   CESAREAN SECTION  06/24/82   [place] Bladder   COLONOSCOPY  Jan. 31, 2014   COLONOSCOPY N/A 12/20/2017   Procedure: COLONOSCOPY;  Surgeon: Rogene Houston, MD;  [facility];  Service: Endoscopy;  Laterality: N/A;  2:25   SPINE SURGERY  2010 and 2011   TONSILLECTOMY     Social History   Social History Narrative   Not on file    There is no immunization history on file for this patient.   Objective: Vital Signs: BP 135/78 (BP Location: Left Arm, Patient Position: Sitting, Cuff Size: Normal)   Pulse 97   Resp 15   Ht 5' 3"  (1.6 m)   Wt 190 lb (86.2 kg)   BMI 33.66 kg/m    Physical Exam Vitals and nursing note reviewed.  Constitutional:      Appearance: She is well-developed.  HENT:     Head: Normocephalic and atraumatic.  Eyes:     Conjunctiva/sclera: Conjunctivae normal.  Cardiovascular:     Rate and Rhythm: Normal rate and regular rhythm.     Heart sounds: Normal heart sounds.  Pulmonary:     Effort: Pulmonary effort is normal.     Breath sounds: Normal breath sounds.  Abdominal:     General: Bowel sounds are normal.     Palpations: Abdomen is soft.  Musculoskeletal:     Cervical back: Normal range of motion.  Skin:    General: Skin is warm and dry.     Capillary Refill: Capillary refill takes less than 2 seconds.  Neurological:     Mental Status: She is alert and oriented to person, place, and time.  Psychiatric:        Behavior: Behavior normal.     Musculoskeletal Exam: C-spine has limited  range of motion without rotation.  Trapezius muscle tension tenderness bilaterally.  No midline spinal tenderness or SI joint tenderness.  Shoulder joints, elbow joints, wrist joints, MCPs, PIPs, DIPs have good range of motion with no synovitis.  Left CMC joint tenderness and prominence noted.  PIP and DIP thickening consistent with osteoarthritis of both hands.  Complete fist formation bilaterally.  Hip joints have good range of motion with no groin pain.  Tenderness ovation of the bilateral trochanteric bursa right greater than left.  Painful range of motion of both knee joints.  No warmth  or effusion noted.  Ankle joints have good range of motion with no tenderness or joint swelling.  No tenderness over MTP joints.  CDAI Exam: CDAI Score: -- Patient Global: --; Provider Global: -- Swollen: --; Tender: -- Joint Exam 06/07/2021   No joint exam has been documented for this visit   There is currently no information documented on the homunculus. Go to the Rheumatology activity and complete the homunculus joint exam.  Investigation: No additional findings.  Imaging: No results found.  Recent Labs: Lab Results  Component Value Date   WBC 7.5 06/23/2020   HGB 14.0 06/23/2020   PLT 254 06/23/2020   NA 139 06/23/2020   K 4.7 06/23/2020   CL 103 06/23/2020   CO2 26 06/23/2020   GLUCOSE 122 (H) 06/23/2020   BUN 15 06/23/2020   CREATININE 0.82 06/23/2020   BILITOT 0.4 06/23/2020   ALKPHOS 64 09/01/2017   AST 35 06/23/2020   ALT 24 06/23/2020   PROT 7.7 06/23/2020   ALBUMIN 4.1 09/01/2017   CALCIUM 10.2 06/23/2020   GFRAA >60 09/01/2017    Speciality Comments: No specialty comments available.  Procedures:  Large Joint Inj: R knee on 06/07/2021 10:40 AM Indications: pain Details: 27 G 1.5 in needle, medial approach  Arthrogram: No  Medications: 1.5 mL lidocaine 1 %; 40 mg triamcinolone acetonide 40 MG/ML Aspirate: 0 mL Outcome: tolerated well, no immediate  complications Procedure, treatment alternatives, risks and benefits explained, specific risks discussed. Consent was given by the patient. Immediately prior to procedure a time out was called to verify the correct patient, procedure, equipment, support staff and site/side marked as required. Patient was prepped and draped in the usual sterile fashion.    Allergies: Bactrim [sulfamethoxazole-trimethoprim], Penicillins, and Tetracyclines & related      Assessment / Plan:     Visit Diagnoses: Fibromyalgia - She continues to experience intermittent myalgias and muscle tenderness due to fibromyalgia.  She remains on Cymbalta, Lyrica, tizanidine, and tramadol for symptomatic relief.  Overall her symptoms have been manageable.  A refill of tizanidine was sent to the pharmacy today.  Discussed the importance of regular exercise and good sleep hygiene.  She will follow-up in the office in 6 months or sooner if needed.    Trapezius muscle spasm: She has some trapezius muscle tension and tenderness bilaterally.  Overall her symptoms have been manageable.  She has been taking tizanidine 4 mg at bedtime as needed for symptomatic relief.  She had trigger point injections performed on 03/04/2019 provided significant relief.  She declined trigger point injections today.  ANA positive - +RF, -CCP.  She has no clinical features of systemic lupus at this time.  Chronic SI joint pain: Improved.  No SI joint tenderness upon palpation.  Primary osteoarthritis of both knees -X-rays of both knees from 01/12/17 were reviewed today in the office: moderate OA and moderate chondromalacia patella.  She presents today with increased pain in both knee joints which has progressively been worsening over the past 1 month.  She has not had any recent injury or fall.  No change in activity.  She has been experiencing nocturnal pain causing interrupted sleep at night.  On examination she has good range of motion of both knee joints with  no warmth or effusion.  X-rays of both knees were obtained today for further evaluation.  Results were discussed with the patient today in the office.  Chondrocalcinosis was noted.  Different treatment options were discussed today in detail.  She  requested a right knee joint cortisone injection today.  She tolerated procedure well.  Procedure note was completed above.  Aftercare was discussed.  She was advised to notify us if her symptoms persist or worsen.  Plan: XR KNEE 3 VIEW RIGHT, XR KNEE 3 VIEW LEFT  Chronic pain of both knees -She presents today with increased pain in both knee joints with no injury prior to the onset of symptoms.  She has not been experiencing any mechanical symptoms.  X-rays of both knees were obtained today for further evaluation.  Chondrocalcinosis was noted.  On examination she has no warmth or effusion.  Different treatment options were discussed today.  She opted for a right knee joint cortisone injection.  She tolerated the procedure well.  Procedure note was completed above.  Aftercare was discussed.  She was advised to notify us if her symptoms persist or worsen.  Plan: XR KNEE 3 VIEW RIGHT, XR KNEE 3 VIEW LEFT  DDD (degenerative disc disease), lumbar: She is not experiencing any increased discomfort in her lower back at this time.  She has no midline spinal tenderness or SI joint tenderness upon palpation.  Hypermobility of joint  Age-related osteoporosis without current pathological fracture - She takes Fosamax 70 mg 1 tablet by mouth once weekly for management of osteoporosis.  No DEXA in Epic to review.  No recent falls or fractures.  Other medical conditions are listed as follows:  Other insomnia  History of COPD  History of hypercholesterolemia  History of cholecystectomy  History of Crohn's disease  History of IBS    Orders: Orders Placed This Encounter  Procedures   Large Joint Inj: R knee   XR KNEE 3 VIEW RIGHT   XR KNEE 3 VIEW LEFT   Meds  ordered this encounter  Medications   tiZANidine (ZANAFLEX) 4 MG tablet    Sig: TAKE 1 TABLET BY MOUTH AT BEDTIME AS NEEDED FOR MUSCLE SPASMS.    Dispense:  30 tablet    Refill:  2     Follow-Up Instructions: Return in about 6 months (around 12/07/2021) for Fibromyalgia, Osteoarthritis, Osteoporosis.   Ofilia Neas, PA-C  Note - This record has been created using Dragon software.  Chart creation errors have been sought, but may not always  have been located. Such creation errors do not reflect on  the standard of medical care.

## 2021-06-05 ENCOUNTER — Other Ambulatory Visit (INDEPENDENT_AMBULATORY_CARE_PROVIDER_SITE_OTHER): Payer: Self-pay | Admitting: Internal Medicine

## 2021-06-05 ENCOUNTER — Other Ambulatory Visit: Payer: Self-pay | Admitting: Physical Medicine & Rehabilitation

## 2021-06-07 ENCOUNTER — Ambulatory Visit: Payer: Medicare HMO | Admitting: Physician Assistant

## 2021-06-07 ENCOUNTER — Encounter: Payer: Self-pay | Admitting: Physician Assistant

## 2021-06-07 ENCOUNTER — Ambulatory Visit (INDEPENDENT_AMBULATORY_CARE_PROVIDER_SITE_OTHER): Payer: Medicare HMO

## 2021-06-07 ENCOUNTER — Ambulatory Visit: Payer: Self-pay

## 2021-06-07 VITALS — BP 135/78 | HR 97 | Resp 15 | Ht 63.0 in | Wt 190.0 lb

## 2021-06-07 DIAGNOSIS — G8929 Other chronic pain: Secondary | ICD-10-CM

## 2021-06-07 DIAGNOSIS — M25561 Pain in right knee: Secondary | ICD-10-CM

## 2021-06-07 DIAGNOSIS — M25562 Pain in left knee: Secondary | ICD-10-CM

## 2021-06-07 DIAGNOSIS — Z9049 Acquired absence of other specified parts of digestive tract: Secondary | ICD-10-CM

## 2021-06-07 DIAGNOSIS — M810 Age-related osteoporosis without current pathological fracture: Secondary | ICD-10-CM

## 2021-06-07 DIAGNOSIS — Z8709 Personal history of other diseases of the respiratory system: Secondary | ICD-10-CM

## 2021-06-07 DIAGNOSIS — Z8719 Personal history of other diseases of the digestive system: Secondary | ICD-10-CM

## 2021-06-07 DIAGNOSIS — M797 Fibromyalgia: Secondary | ICD-10-CM

## 2021-06-07 DIAGNOSIS — R768 Other specified abnormal immunological findings in serum: Secondary | ICD-10-CM

## 2021-06-07 DIAGNOSIS — M62838 Other muscle spasm: Secondary | ICD-10-CM | POA: Diagnosis not present

## 2021-06-07 DIAGNOSIS — G4709 Other insomnia: Secondary | ICD-10-CM

## 2021-06-07 DIAGNOSIS — M533 Sacrococcygeal disorders, not elsewhere classified: Secondary | ICD-10-CM | POA: Diagnosis not present

## 2021-06-07 DIAGNOSIS — Z8639 Personal history of other endocrine, nutritional and metabolic disease: Secondary | ICD-10-CM | POA: Diagnosis not present

## 2021-06-07 DIAGNOSIS — M249 Joint derangement, unspecified: Secondary | ICD-10-CM | POA: Diagnosis not present

## 2021-06-07 DIAGNOSIS — M5136 Other intervertebral disc degeneration, lumbar region: Secondary | ICD-10-CM

## 2021-06-07 DIAGNOSIS — M170 Bilateral primary osteoarthritis of knee: Secondary | ICD-10-CM | POA: Diagnosis not present

## 2021-06-07 MED ORDER — LIDOCAINE HCL 1 % IJ SOLN
1.5000 mL | INTRAMUSCULAR | Status: AC | PRN
  Administered 2021-06-07: 1.5 mL

## 2021-06-07 MED ORDER — TRIAMCINOLONE ACETONIDE 40 MG/ML IJ SUSP
40.0000 mg | INTRAMUSCULAR | Status: AC | PRN
  Administered 2021-06-07: 40 mg via INTRA_ARTICULAR

## 2021-06-07 MED ORDER — TIZANIDINE HCL 4 MG PO TABS: ORAL_TABLET | ORAL | 2 refills | Status: DC

## 2021-06-07 NOTE — Telephone Encounter (Signed)
Last OV 10/20/20

## 2021-06-08 ENCOUNTER — Encounter: Payer: Self-pay | Admitting: Registered Nurse

## 2021-06-08 ENCOUNTER — Ambulatory Visit: Payer: Medicare HMO | Admitting: Registered Nurse

## 2021-06-08 ENCOUNTER — Encounter: Payer: Medicare HMO | Attending: Registered Nurse | Admitting: Registered Nurse

## 2021-06-08 VITALS — BP 134/70 | HR 94 | Ht 63.0 in | Wt 191.0 lb

## 2021-06-08 DIAGNOSIS — M25511 Pain in right shoulder: Secondary | ICD-10-CM | POA: Insufficient documentation

## 2021-06-08 DIAGNOSIS — M25512 Pain in left shoulder: Secondary | ICD-10-CM | POA: Insufficient documentation

## 2021-06-08 DIAGNOSIS — Z5181 Encounter for therapeutic drug level monitoring: Secondary | ICD-10-CM | POA: Diagnosis not present

## 2021-06-08 DIAGNOSIS — G894 Chronic pain syndrome: Secondary | ICD-10-CM | POA: Diagnosis not present

## 2021-06-08 DIAGNOSIS — G8929 Other chronic pain: Secondary | ICD-10-CM | POA: Insufficient documentation

## 2021-06-08 DIAGNOSIS — M797 Fibromyalgia: Secondary | ICD-10-CM | POA: Insufficient documentation

## 2021-06-08 DIAGNOSIS — M170 Bilateral primary osteoarthritis of knee: Secondary | ICD-10-CM | POA: Insufficient documentation

## 2021-06-08 DIAGNOSIS — Z79891 Long term (current) use of opiate analgesic: Secondary | ICD-10-CM | POA: Insufficient documentation

## 2021-06-08 DIAGNOSIS — M545 Low back pain, unspecified: Secondary | ICD-10-CM | POA: Diagnosis not present

## 2021-06-08 MED ORDER — PREGABALIN 50 MG PO CAPS: 50.0000 mg | ORAL_CAPSULE | Freq: Two times a day (BID) | ORAL | 5 refills | Status: DC

## 2021-06-08 MED ORDER — TRAMADOL HCL 50 MG PO TABS: 50.0000 mg | ORAL_TABLET | Freq: Three times a day (TID) | ORAL | 5 refills | Status: DC | PRN

## 2021-06-08 NOTE — Progress Notes (Signed)
X-rays of both knees are consistent with moderate osteoarthritis and moderate chondromalacia patella.  Chondrocalcinosis was noted.  No radiographic progression since 2019.

## 2021-06-08 NOTE — Progress Notes (Signed)
Subjective:    Patient ID: Desiree Hogan, female    DOB: [birth year], 66 y.o.   MRN: [id number]  HPI: Desiree Hogan is a 66 y.o. female who returns for follow up appointment for chronic pain and medication refill. She states her pain is located in her bilateral shoulders, lower back pain and bilateral knee pain.She  rates her pain 6. Her current exercise regime is walking and performing stretching exercises.  Desiree Hogan Morphine equivalent is 13.00 MME.   Last UDS was Performed on 06/08/2021, it was consistent.      Pain Inventory Average Pain 6 Pain Right Now 6 My pain is intermittent, constant, and aching  In the last 24 hours, has pain interfered with the following? General activity 0 Relation with others 0 Enjoyment of life 3 What TIME of day is your pain at its worst? daytime Sleep (in general) Fair  Pain is worse with: some activites Pain improves with: medication and injections Relief from Meds: 4  Family History  Problem Relation Age of Onset   Cancer Mother        Colon or vaginal ?   Deep vein thrombosis Mother    Hypertension Father    Diabetes Father    Heart disease Father        Heart Disease before age 9   Diabetes Sister    Diabetes Brother    Hypertension Brother    Healthy Son    Healthy Daughter    Healthy Daughter    Healthy Daughter    Social History   Socioeconomic History   Marital status: Married    Spouse name: Not on file   Number of children: Not on file   Years of education: Not on file   Highest education level: Not on file  Occupational History   Not on file  Tobacco Use   Smoking status: Former    Packs/day: 0.75    Years: 20.00    Pack years: 15.00    Types: Cigarettes    Quit date: 01/03/2009    Years since quitting: 12.4   Smokeless tobacco: Never  Vaping Use   Vaping Use: Never used  Substance and Sexual Activity   Alcohol use: No   Drug use: No   Sexual activity: Not on file  Other Topics Concern   Not on file   Social History Narrative   Not on file   Social Determinants of Health   Financial Resource Strain: Not on file  Food Insecurity: Not on file  Transportation Needs: Not on file  Physical Activity: Not on file  Stress: Not on file  Social Connections: Not on file   Past Surgical History:  Procedure Laterality Date   BIOPSY  12/20/2017   Procedure: BIOPSY;  Surgeon: Rogene Houston, MD;  [facility];  Service: Endoscopy;;  cecal erosions   CESAREAN SECTION  06/24/82   [place] Bladder   COLONOSCOPY  Jan. 31, 2014   COLONOSCOPY N/A 12/20/2017   Procedure: COLONOSCOPY;  Surgeon: Rogene Houston, MD;  [facility];  Service: Endoscopy;  Laterality: N/A;  2:25   [place]  2010 and 2011   TONSILLECTOMY     Past Surgical History:  Procedure Laterality Date   BIOPSY  12/20/2017   Procedure: BIOPSY;  Surgeon: Rogene Houston, MD;  [facility];  Service: Endoscopy;;  cecal erosions   CESAREAN SECTION  06/24/82   CHOLECYSTECTOMY  [place]  Jan. 31, 2014   COLONOSCOPY N/A 12/20/2017   Procedure: COLONOSCOPY;  Surgeon: Rogene Houston, MD;  [facility];  Service: Endoscopy;  Laterality: N/A;  2:25   [place]  2010 and 2011   TONSILLECTOMY     Past Medical History:  Diagnosis Date   Abdominal pain    Anemia    Back pain    Colitis    COPD (chronic obstructive pulmonary disease) ([place])    not on home o2   Crohn's disease ([place]) 03/29/12   Diabetes mellitus without complication ([facility])    Fibromyalgia    H/O breast biopsy    Hip pain    Hyperlipidemia    Hypothyroidism    Irritable bowel syndrome    Joint pain    Nonspecific abnormal finding in stool contents    Peripheral vascular disease ([facility])    Ulcer    Mouth   BP 134/70   Pulse 94   Ht 5' 3"  (1.6 m)   Wt 191 lb (86.6 kg)   SpO2 96%   BMI 33.83 kg/m   Opioid Risk Score:   Fall Risk Score:  `1  Depression  screen PHQ 2/9     11/17/2020    1:41 PM 06/16/2020    1:38 PM 10/24/2019    1:34 PM 03/26/2019   12:41 PM  Depression screen PHQ 2/9  Decreased Interest 0 0 1 2  Down, Depressed, Hopeless 0 0 1 1  PHQ - 2 Score 0 0 2 3  Altered sleeping    2  Tired, decreased energy    2  Change in appetite    0  Feeling bad or failure about yourself     0  Trouble concentrating    2  Moving slowly or fidgety/restless    1  Suicidal thoughts    0  PHQ-9 Score    10    Review of Systems  Musculoskeletal:  Positive for back pain.       Pain in both shoulders      Objective:   Physical Exam Vitals and nursing note reviewed.  Constitutional:      Appearance: Normal appearance.  Cardiovascular:     Rate and Rhythm: Normal rate and regular rhythm.     Pulses: Normal pulses.     Heart sounds: Normal heart sounds.  Pulmonary:     Effort: Pulmonary effort is normal.     Breath sounds: Normal breath sounds.  Musculoskeletal:     Cervical back: Normal range of motion and neck supple.     Comments: Normal Muscle Bulk and Muscle Testing Reveals:  Upper Extremities: Full ROM and Muscle Strength 5/5 Left AC Joint Tenderness  Lumbar Paraspinal Tenderness: L-3-L-5 Left Greater Trochanter Tenderness Lower Extremities: Decreased ROM and Muscle Strength 5/5 Bilateral Lower Extremities Flexion Produces Pain into her Bilateral Patella's Arises from Table with ease Narrow Based  Gait     Skin:    General: Skin is warm and dry.  Neurological:     Mental Status: She is alert and oriented to person, place, and time.  Psychiatric:        Mood and Affect: Mood normal.        Behavior: Behavior normal.         Assessment & Plan:  Bilateral Shoulder Pain: Continue HEP as Tolerated. Continue current medication regimen. Continue to Monitor.  Lower Back Pain without Sciatica: Continue HEP as  Tolerated. Continue current medication regimen. Continue to Monitor.  Primary OA in Bilateral Knees: Continue  HEP as Tolerated. Continue Current Medication Regimen. Continue to Monitor.  Fibromyalgia: Continue HEP as Tolerated. Continue Lyrica.  Continue to Monitor.  Chronic Pain Syndrome: Refilled: Tramadol 50 mg three times a day as needed for pain #90. We will continue the opioid monitoring program, this consists of regular clinic visits, examinations, urine drug screen, pill counts as well as use of [place] Controlled Substance Reporting system. A 12 month History has been reviewed on the [place] Controlled Substance Reporting System on 06/08/2021. F/U in 6 months

## 2021-06-10 LAB — TOXASSURE SELECT,+ANTIDEPR,UR

## 2021-06-11 ENCOUNTER — Telehealth: Payer: Self-pay | Admitting: *Deleted

## 2021-06-11 NOTE — Telephone Encounter (Signed)
Urine drug screen for this encounter is consistent for prescribed medication 

## 2021-06-16 ENCOUNTER — Telehealth: Payer: Self-pay | Admitting: Rheumatology

## 2021-06-16 NOTE — Telephone Encounter (Signed)
I called patient 

## 2021-06-16 NOTE — Telephone Encounter (Signed)
Patient called stating she was returning  Andrea's call regarding her labwork results. 

## 2021-06-18 ENCOUNTER — Other Ambulatory Visit: Payer: Self-pay | Admitting: Physician Assistant

## 2021-06-21 DIAGNOSIS — Z20822 Contact with and (suspected) exposure to covid-19: Secondary | ICD-10-CM | POA: Diagnosis not present

## 2021-06-21 DIAGNOSIS — I444 Left anterior fascicular block: Secondary | ICD-10-CM | POA: Diagnosis not present

## 2021-06-21 DIAGNOSIS — R000 Tachycardia, unspecified: Secondary | ICD-10-CM | POA: Diagnosis not present

## 2021-06-21 DIAGNOSIS — R6883 Chills (without fever): Secondary | ICD-10-CM | POA: Diagnosis not present

## 2021-06-21 DIAGNOSIS — R69 Illness, unspecified: Secondary | ICD-10-CM | POA: Diagnosis not present

## 2021-06-21 DIAGNOSIS — Z743 Need for continuous supervision: Secondary | ICD-10-CM | POA: Diagnosis not present

## 2021-06-21 DIAGNOSIS — K5730 Diverticulosis of large intestine without perforation or abscess without bleeding: Secondary | ICD-10-CM | POA: Diagnosis not present

## 2021-06-21 DIAGNOSIS — R52 Pain, unspecified: Secondary | ICD-10-CM | POA: Diagnosis not present

## 2021-06-21 DIAGNOSIS — M545 Low back pain, unspecified: Secondary | ICD-10-CM | POA: Diagnosis not present

## 2021-06-21 DIAGNOSIS — N3000 Acute cystitis without hematuria: Secondary | ICD-10-CM | POA: Diagnosis not present

## 2021-06-21 DIAGNOSIS — R059 Cough, unspecified: Secondary | ICD-10-CM | POA: Diagnosis not present

## 2021-06-21 DIAGNOSIS — M549 Dorsalgia, unspecified: Secondary | ICD-10-CM | POA: Diagnosis not present

## 2021-06-21 DIAGNOSIS — R109 Unspecified abdominal pain: Secondary | ICD-10-CM | POA: Diagnosis not present

## 2021-06-21 DIAGNOSIS — Z880 Allergy status to penicillin: Secondary | ICD-10-CM | POA: Diagnosis not present

## 2021-06-21 DIAGNOSIS — R5381 Other malaise: Secondary | ICD-10-CM | POA: Diagnosis not present

## 2021-06-21 DIAGNOSIS — J449 Chronic obstructive pulmonary disease, unspecified: Secondary | ICD-10-CM | POA: Diagnosis not present

## 2021-06-21 NOTE — Telephone Encounter (Signed)
Next Visit: 12/08/2021  Last Visit: 06/07/2021  Last Fill: 03/22/2021  Dx: Fibromyalgia   Current Dose per office note on 06/07/2021: She remains on Cymbalta  Okay to refill Cymbalta?

## 2021-06-24 DIAGNOSIS — T368X5A Adverse effect of other systemic antibiotics, initial encounter: Secondary | ICD-10-CM | POA: Diagnosis not present

## 2021-06-24 DIAGNOSIS — N390 Urinary tract infection, site not specified: Secondary | ICD-10-CM | POA: Diagnosis not present

## 2021-06-24 DIAGNOSIS — E1165 Type 2 diabetes mellitus with hyperglycemia: Secondary | ICD-10-CM | POA: Diagnosis not present

## 2021-06-24 DIAGNOSIS — Z299 Encounter for prophylactic measures, unspecified: Secondary | ICD-10-CM | POA: Diagnosis not present

## 2021-06-24 DIAGNOSIS — B9620 Unspecified Escherichia coli [E. coli] as the cause of diseases classified elsewhere: Secondary | ICD-10-CM | POA: Diagnosis not present

## 2021-07-09 ENCOUNTER — Other Ambulatory Visit: Payer: Self-pay | Admitting: Physician Assistant

## 2021-07-15 ENCOUNTER — Telehealth: Payer: Self-pay | Admitting: Rheumatology

## 2021-07-15 NOTE — Telephone Encounter (Signed)
Patient called the office stating she missed a call from Seth Bake and requests a return call.

## 2021-07-15 NOTE — Telephone Encounter (Signed)
Attempted to return patient's call. Advised patient I have not recently reached out to her and am not sure when that message may have been left. Advised patient to call the office if she has any further questions or concerns.

## 2021-08-09 ENCOUNTER — Other Ambulatory Visit: Payer: Self-pay | Admitting: Physician Assistant

## 2021-08-09 DIAGNOSIS — E119 Type 2 diabetes mellitus without complications: Secondary | ICD-10-CM | POA: Diagnosis not present

## 2021-08-13 DIAGNOSIS — Z6834 Body mass index (BMI) 34.0-34.9, adult: Secondary | ICD-10-CM | POA: Diagnosis not present

## 2021-08-13 DIAGNOSIS — Z299 Encounter for prophylactic measures, unspecified: Secondary | ICD-10-CM | POA: Diagnosis not present

## 2021-08-13 DIAGNOSIS — Z713 Dietary counseling and surveillance: Secondary | ICD-10-CM | POA: Diagnosis not present

## 2021-08-13 DIAGNOSIS — E1165 Type 2 diabetes mellitus with hyperglycemia: Secondary | ICD-10-CM | POA: Diagnosis not present

## 2021-09-10 ENCOUNTER — Other Ambulatory Visit: Payer: Self-pay | Admitting: Physician Assistant

## 2021-09-10 NOTE — Telephone Encounter (Signed)
Next Visit: 12/08/2021  Last Visit: 06/07/2021  Last Fill: 06/21/2021 Cymbalta, 06/07/2021 Tizanidine  Dx: Fibromyalgia  Current Dose per office note on 06/07/2021: tizanidine 4 mg at bedtime as needed for symptomatic relief, Cymbalta dose not discussed.   Okay to refill Cymbalta and Tizanidine?

## 2021-10-10 ENCOUNTER — Other Ambulatory Visit (INDEPENDENT_AMBULATORY_CARE_PROVIDER_SITE_OTHER): Payer: Self-pay | Admitting: Internal Medicine

## 2021-10-10 ENCOUNTER — Other Ambulatory Visit: Payer: Self-pay | Admitting: Physician Assistant

## 2021-10-10 DIAGNOSIS — K50118 Crohn's disease of large intestine with other complication: Secondary | ICD-10-CM

## 2021-10-10 DIAGNOSIS — K5010 Crohn's disease of large intestine without complications: Secondary | ICD-10-CM

## 2021-10-11 NOTE — Telephone Encounter (Signed)
Will refill medication for 3 months, needs follow up appointment with any provider in order to receive any refills.  Thanks,  Maylon Peppers, MD Gastroenterology and Hepatology [facility]

## 2021-10-18 ENCOUNTER — Other Ambulatory Visit (INDEPENDENT_AMBULATORY_CARE_PROVIDER_SITE_OTHER): Payer: Self-pay | Admitting: Gastroenterology

## 2021-10-18 DIAGNOSIS — K50118 Crohn's disease of large intestine with other complication: Secondary | ICD-10-CM

## 2021-10-18 DIAGNOSIS — K5010 Crohn's disease of large intestine without complications: Secondary | ICD-10-CM

## 2021-10-18 NOTE — Telephone Encounter (Signed)
Last seen 10/2020 next appt with chelsea on 12/06/2021.

## 2021-11-03 DIAGNOSIS — J320 Chronic maxillary sinusitis: Secondary | ICD-10-CM | POA: Diagnosis not present

## 2021-11-03 DIAGNOSIS — R6889 Other general symptoms and signs: Secondary | ICD-10-CM | POA: Diagnosis not present

## 2021-11-15 ENCOUNTER — Other Ambulatory Visit (INDEPENDENT_AMBULATORY_CARE_PROVIDER_SITE_OTHER): Payer: Self-pay | Admitting: Gastroenterology

## 2021-11-15 DIAGNOSIS — Z87891 Personal history of nicotine dependence: Secondary | ICD-10-CM | POA: Diagnosis not present

## 2021-11-15 DIAGNOSIS — Z0000 Encounter for general adult medical examination without abnormal findings: Secondary | ICD-10-CM | POA: Diagnosis not present

## 2021-11-15 DIAGNOSIS — R059 Cough, unspecified: Secondary | ICD-10-CM | POA: Diagnosis not present

## 2021-11-15 DIAGNOSIS — Z6833 Body mass index (BMI) 33.0-33.9, adult: Secondary | ICD-10-CM | POA: Diagnosis not present

## 2021-11-15 DIAGNOSIS — R5383 Other fatigue: Secondary | ICD-10-CM | POA: Diagnosis not present

## 2021-11-15 DIAGNOSIS — Z7189 Other specified counseling: Secondary | ICD-10-CM | POA: Diagnosis not present

## 2021-11-15 DIAGNOSIS — Z79899 Other long term (current) drug therapy: Secondary | ICD-10-CM | POA: Diagnosis not present

## 2021-11-15 DIAGNOSIS — K5010 Crohn's disease of large intestine without complications: Secondary | ICD-10-CM

## 2021-11-15 DIAGNOSIS — E7800 Pure hypercholesterolemia, unspecified: Secondary | ICD-10-CM | POA: Diagnosis not present

## 2021-11-15 DIAGNOSIS — Z1331 Encounter for screening for depression: Secondary | ICD-10-CM | POA: Diagnosis not present

## 2021-11-15 DIAGNOSIS — K50118 Crohn's disease of large intestine with other complication: Secondary | ICD-10-CM

## 2021-11-15 DIAGNOSIS — E1165 Type 2 diabetes mellitus with hyperglycemia: Secondary | ICD-10-CM | POA: Diagnosis not present

## 2021-11-15 DIAGNOSIS — R053 Chronic cough: Secondary | ICD-10-CM | POA: Diagnosis not present

## 2021-11-15 DIAGNOSIS — E559 Vitamin D deficiency, unspecified: Secondary | ICD-10-CM | POA: Diagnosis not present

## 2021-11-15 DIAGNOSIS — Z1339 Encounter for screening examination for other mental health and behavioral disorders: Secondary | ICD-10-CM | POA: Diagnosis not present

## 2021-11-15 DIAGNOSIS — Z299 Encounter for prophylactic measures, unspecified: Secondary | ICD-10-CM | POA: Diagnosis not present

## 2021-11-16 LAB — COMPREHENSIVE METABOLIC PANEL: EGFR: 78

## 2021-11-24 ENCOUNTER — Ambulatory Visit
Admission: RE | Admit: 2021-11-24 | Discharge: 2021-11-24 | Disposition: A | Discharge status: Home / Self Care | Payer: Medicare HMO | Source: Ambulatory Visit | Attending: Internal Medicine | Admitting: Internal Medicine

## 2021-11-24 ENCOUNTER — Other Ambulatory Visit: Payer: Self-pay | Admitting: Internal Medicine

## 2021-11-24 DIAGNOSIS — Z1231 Encounter for screening mammogram for malignant neoplasm of breast: Secondary | ICD-10-CM | POA: Diagnosis not present

## 2021-11-24 NOTE — Progress Notes (Signed)
Office Visit Note  Patient: Desiree Hogan             Date of Birth: 07-Feb-1955           MRN: [id number]             PCP: Glenda Chroman, MD Referring: Glenda Chroman, MD Visit Date: 12/08/2021 Occupation: @GUAROCC @  Subjective:  Neck pain  History of Present Illness: Desiree Hogan is a 66 y.o. female with history of fibromyalgia osteoarthritis and degenerative disc disease.  She states she has been having neck pain and stiffness for the last few weeks.  She describes discomfort in the left trapezius area.  She has a stiffness with rotating her head.  She also has chronic lower back pain and SI joint pain.  She continues to have discomfort in her bilateral shoulders, hands, and her knees.  She has been taking Cymbalta 30 mg p.o. daily, tizanidine 4 mg p.o. nightly, Lyrica and tramadol on a regular basis.  She continues to have nocturnal pain and difficulty using her hands.  She states she has intermittent numbness in her right hand.  Activities of Daily Living:  Patient reports morning stiffness for 45-60 minutes.   Patient Reports nocturnal pain.  Difficulty dressing/grooming: Denies Difficulty climbing stairs: Denies Difficulty getting out of chair: Denies Difficulty using hands for taps, buttons, cutlery, and/or writing: Reports  Review of Systems  Constitutional:  Positive for fatigue.  HENT:  Negative for mouth sores and mouth dryness.   Eyes:  Negative for dryness.  Respiratory:  Negative for difficulty breathing.   Cardiovascular:  Negative for chest pain and palpitations.  Gastrointestinal:  Negative for blood in stool, constipation and diarrhea.  Endocrine: Negative for increased urination.  Genitourinary:  Negative for involuntary urination.  Musculoskeletal:  Positive for joint pain, joint pain, myalgias, muscle weakness, morning stiffness, muscle tenderness and myalgias. Negative for gait problem and joint swelling.  Skin:  Negative for color change, hair loss and  sensitivity to sunlight.  Allergic/Immunologic: Positive for susceptible to infections.  Neurological:  Positive for dizziness and headaches.  Hematological:  Negative for swollen glands.  Psychiatric/Behavioral:  Negative for depressed mood and sleep disturbance. The patient is not nervous/anxious.     PMFS History:  Patient Active Problem List   Diagnosis Date Noted   Elevated fecal calprotectin 12/06/2021   Nausea without vomiting 10/20/2020   Abdominal pain 07/17/2020   Diarrhea 06/23/2020   Diverticulitis of colon 10/11/2017   Crohn's colitis ([place]) 10/11/2017   Fibromyalgia 07/14/2017   ANA positive 07/14/2017   Primary osteoarthritis of both knees 07/14/2017   DDD (degenerative disc disease), lumbar 07/14/2017   Hypermobility of joint 07/14/2017   Age-related osteoporosis without current pathological fracture 07/14/2017   Other insomnia 07/14/2017   History of Crohn's disease 07/14/2017   History of IBS 07/14/2017   History of hypercholesterolemia 07/14/2017   History of COPD 07/14/2017   History of cholecystectomy 07/14/2017   Community acquired pneumonia 12/13/2016   Pneumonia 12/13/2016   Leukocytosis 12/13/2016   Hypokalemia 12/13/2016   Diabetes mellitus without complication ([place]) 40/98/1191    Past Medical History:  Diagnosis Date   Abdominal pain    Anemia    Back pain    Colitis    COPD (chronic obstructive pulmonary disease) ([place])    not on home o2   Crohn's disease ([place]) 03/29/12   Diabetes mellitus without complication (HCC)    Fibromyalgia    H/O breast biopsy  Hip pain    Hyperlipidemia    Hypothyroidism    Irritable bowel syndrome    Joint pain    Nonspecific abnormal finding in stool contents    Peripheral vascular disease ([place])    Ulcer    Mouth    Family History  Problem Relation Age of Onset   Cancer Mother        Colon or vaginal ?   Deep vein thrombosis Mother    Hypertension Father    Diabetes Father    Heart disease Father         Heart Disease before age 61   Diabetes Sister    Healthy Daughter    Healthy Daughter    Healthy Daughter    Diabetes Brother    Hypertension Brother    Healthy Son    Breast cancer Neg Hx    Past Surgical History:  Procedure Laterality Date   BIOPSY  12/20/2017   Procedure: BIOPSY;  Surgeon: Rogene Houston, MD;  [facility];  Service: Endoscopy;;  cecal erosions   CESAREAN SECTION  06/24/82   [place] Bladder   COLONOSCOPY  Jan. 31, 2014   COLONOSCOPY N/A 12/20/2017   Procedure: COLONOSCOPY;  Surgeon: Rogene Houston, MD;  [facility];  Service: Endoscopy;  Laterality: N/A;  2:25   SPINE SURGERY  2010 and 2011   TONSILLECTOMY     Social History   Social History Narrative   Not on file    There is no immunization history on file for this patient.   Objective: Vital Signs: BP 135/86 (BP Location: Left Arm, Patient Position: Sitting, Cuff Size: Normal)   Pulse 94   Resp 18   Ht 5' 3"  (1.6 m)   Wt 189 lb 9.6 oz (86 kg)   BMI 33.59 kg/m    Physical Exam Vitals and nursing note reviewed.  Constitutional:      Appearance: She is well-developed.  HENT:     Head: Normocephalic and atraumatic.  Eyes:     Conjunctiva/sclera: Conjunctivae normal.  Cardiovascular:     Rate and Rhythm: Normal rate and regular rhythm.     Heart sounds: Normal heart sounds.  Pulmonary:     Effort: Pulmonary effort is normal.     Breath sounds: Normal breath sounds.  Abdominal:     General: Bowel sounds are normal.     Palpations: Abdomen is soft.  Musculoskeletal:     Cervical back: Normal range of motion.  Lymphadenopathy:     Cervical: No cervical adenopathy.  Skin:    General: Skin is warm and dry.     Capillary Refill: Capillary refill takes less than 2 seconds.  Neurological:     Mental Status: She is alert and oriented to person, place, and time.  Psychiatric:        Behavior: Behavior normal.      Musculoskeletal Exam:  She had limited range of motion of the cervical spine with bilateral trapezius spasm.  She had increased discomfort over left trapezius region.  She had limited range of motion of thoracic and lumbar spine with pain.  Shoulder joints, elbow joints, wrist joints with good range of motion.  She had bilateral PIP and DIP thickening with no synovitis.  Hip joints and knee joints with good range of motion.  She had no tenderness over ankles or MTPs.  She generalized hyperalgesia and positive tender points.  CDAI Exam: CDAI Score: -- Patient  Global: --; Provider Global: -- Swollen: --; Tender: -- Joint Exam 12/08/2021   No joint exam has been documented for this visit   There is currently no information documented on the homunculus. Go to the Rheumatology activity and complete the homunculus joint exam.  Investigation: No additional findings.  Imaging: No results found.  Recent Labs: Lab Results  Component Value Date   WBC 7.5 06/23/2020   HGB 14.0 06/23/2020   PLT 254 06/23/2020   NA 139 06/23/2020   K 4.7 06/23/2020   CL 103 06/23/2020   CO2 26 06/23/2020   GLUCOSE 122 (H) 06/23/2020   BUN 15 06/23/2020   CREATININE 0.82 06/23/2020   BILITOT 0.4 06/23/2020   ALKPHOS 64 09/01/2017   AST 35 06/23/2020   ALT 24 06/23/2020   PROT 7.7 06/23/2020   ALBUMIN 4.1 09/01/2017   CALCIUM 10.2 06/23/2020   GFRAA >60 09/01/2017    Speciality Comments: No specialty comments available.  Procedures:  Trigger Point Inj  Date/Time: 12/08/2021 2:47 PM  Performed by: Bo Merino, MD Authorized by: Bo Merino, MD   Consent Given by:  Patient Site marked: the procedure site was marked   Timeout: prior to procedure the correct patient, procedure, and site was verified   Indications:  Muscle spasm and pain Total # of Trigger Points:  1 Location: neck   Needle Size:  27 G Approach:  Dorsal Medications #1:  0.5 mL lidocaine 1 %; 10 mg triamcinolone acetonide 40 MG/ML Patient  tolerance:  Patient tolerated the procedure well with no immediate complications  Allergies: Bactrim [sulfamethoxazole-trimethoprim], Penicillins, Tetracyclines & related, and Ciprofloxacin   Assessment / Plan:     Visit Diagnoses: Fibromyalgia -she has been having a flare of fibromyalgia with increased pain and discomfort.  She had generalized pain and positive tender points.  She remains on Cymbalta 30 mg p.o. daily, Lyrica, tizanidine 4 mg p.o. nightly, and tramadol for symptomatic relief..  She states despite taking all the medication she is in constant discomfort.  Trapezius muscle spasm-she had bilateral trapezius spasm more tenderness in the left side.  Per patient's request left trapezius area was injected with lidocaine and Kenalog as described above.  She tolerated procedure well.  Postprocedure instructions were given.  ANA positive - +RF, -CCP.  She has no clinical features of autoimmune disease.  She denies any history of oral ulcers, nasal ulcers, malar rash, photosensitivity or Raynaud's phenomenon.  Chronic SI joint pain-she has intermittent pain in the SI joints.  She states that she had a good response to cortisone injection given to her at last visit.  Primary osteoarthritis of both knees-she has chronic knee joint discomfort.  Lower extremity muscle strengthening exercises were discussed.  DDD (degenerative disc disease), lumbar-she has chronic lower back pain.  Core strengthening exercises and weight loss been discussed.  Hypermobility of joint-contributes to arthralgias.  Age-related osteoporosis without current pathological fracture - She takes Fosamax 70 mg 1 tablet by mouth once weekly for management of osteoporosis.  No DEXA in Epic to review.  Other insomnia-good sleep hygiene was discussed.  Other medical problems are listed as follows:  History of hypercholesterolemia  History of COPD  History of Crohn's disease  History of IBS  History of  cholecystectomy  Orders: No orders of the defined types were placed in this encounter.  No orders of the defined types were placed in this encounter.    Follow-Up Instructions: No follow-ups on file.   Bo Merino, MD  Note -  This record has been created using [employer].  Chart creation errors have been sought, but may not always  have been located. Such creation errors do not reflect on  the standard of medical care.

## 2021-11-28 ENCOUNTER — Other Ambulatory Visit: Payer: Self-pay | Admitting: Physician Assistant

## 2021-11-29 NOTE — Telephone Encounter (Signed)
Next Visit: 12/08/2021   Last Visit: 06/07/2021   Last Fill: 09/13/2021   Dx: Fibromyalgia    Current Dose per office note on 06/07/2021: She remains on Cymbalta   Okay to refill Cymbalta?

## 2021-12-03 ENCOUNTER — Encounter: Payer: Medicare HMO | Attending: Registered Nurse | Admitting: Registered Nurse

## 2021-12-03 ENCOUNTER — Encounter: Payer: Self-pay | Admitting: Registered Nurse

## 2021-12-03 VITALS — BP 144/79 | HR 105 | Ht 63.0 in | Wt 190.6 lb

## 2021-12-03 DIAGNOSIS — M545 Low back pain, unspecified: Secondary | ICD-10-CM | POA: Insufficient documentation

## 2021-12-03 DIAGNOSIS — M797 Fibromyalgia: Secondary | ICD-10-CM | POA: Insufficient documentation

## 2021-12-03 DIAGNOSIS — M25512 Pain in left shoulder: Secondary | ICD-10-CM | POA: Diagnosis not present

## 2021-12-03 DIAGNOSIS — M25511 Pain in right shoulder: Secondary | ICD-10-CM | POA: Diagnosis not present

## 2021-12-03 DIAGNOSIS — Z79891 Long term (current) use of opiate analgesic: Secondary | ICD-10-CM | POA: Insufficient documentation

## 2021-12-03 DIAGNOSIS — Z5181 Encounter for therapeutic drug level monitoring: Secondary | ICD-10-CM | POA: Insufficient documentation

## 2021-12-03 DIAGNOSIS — M1711 Unilateral primary osteoarthritis, right knee: Secondary | ICD-10-CM | POA: Insufficient documentation

## 2021-12-03 DIAGNOSIS — G8929 Other chronic pain: Secondary | ICD-10-CM | POA: Diagnosis not present

## 2021-12-03 DIAGNOSIS — M170 Bilateral primary osteoarthritis of knee: Secondary | ICD-10-CM | POA: Insufficient documentation

## 2021-12-03 DIAGNOSIS — G894 Chronic pain syndrome: Secondary | ICD-10-CM | POA: Insufficient documentation

## 2021-12-03 MED ORDER — TRAMADOL HCL 50 MG PO TABS: 50.0000 mg | ORAL_TABLET | Freq: Three times a day (TID) | ORAL | 5 refills | Status: DC | PRN

## 2021-12-03 MED ORDER — PREGABALIN 50 MG PO CAPS: 50.0000 mg | ORAL_CAPSULE | Freq: Two times a day (BID) | ORAL | 5 refills | Status: DC

## 2021-12-03 NOTE — Progress Notes (Unsigned)
Subjective:    Patient ID: Desiree Hogan, female    DOB: August 01, 1955, 66 y.o.   MRN: [id number]  HPI: Desiree Hogan is a 66 y.o. female who returns for follow up appointment for chronic pain and medication refill. states *** pain is located in  ***. rates pain ***. current exercise regime is walking and performing stretching exercises.  Ms. Labarbera Morphine equivalent is *** MME.   UDS ordered today.    Pain Inventory Average Pain 5 Pain Right Now 5 My pain is sharp  In the last 24 hours, has pain interfered with the following? General activity 0 Relation with others 0 Enjoyment of life 0 What TIME of day is your pain at its worst? evening Sleep (in general) Fair  Pain is worse with: standing and some activites Pain improves with: medication Relief from Meds: 5  Family History  Problem Relation Age of Onset   Cancer Mother        Colon or vaginal ?   Deep vein thrombosis Mother    Hypertension Father    Diabetes Father    Heart disease Father        Heart Disease before age 45   Diabetes Sister    Healthy Daughter    Healthy Daughter    Healthy Daughter    Diabetes Brother    Hypertension Brother    Healthy Son    Breast cancer Neg Hx    Social History   Socioeconomic History   Marital status: Married    Spouse name: Not on file   Number of children: Not on file   Years of education: Not on file   Highest education level: Not on file  Occupational History   Not on file  Tobacco Use   Smoking status: Former    Packs/day: 0.75    Years: 20.00    Total pack years: 15.00    Types: Cigarettes    Quit date: 01/03/2009    Years since quitting: 12.9   Smokeless tobacco: Never  Vaping Use   Vaping Use: Never used  Substance and Sexual Activity   Alcohol use: No   Drug use: No   Sexual activity: Not on file  Other Topics Concern   Not on file  Social History Narrative   Not on file   Social Determinants of Health   Financial Resource Strain: Not on  file  Food Insecurity: Not on file  Transportation Needs: Not on file  Physical Activity: Not on file  Stress: Not on file  Social Connections: Not on file   Past Surgical History:  Procedure Laterality Date   BIOPSY  12/20/2017   Procedure: BIOPSY;  Surgeon: Rogene Houston, MD;  [facility];  Service: Endoscopy;;  cecal erosions   CESAREAN SECTION  06/24/82   [place] Bladder   COLONOSCOPY  Jan. 31, 2014   COLONOSCOPY N/A 12/20/2017   Procedure: COLONOSCOPY;  Surgeon: Rogene Houston, MD;  [facility];  Service: Endoscopy;  Laterality: N/A;  2:25   [place]  2010 and 2011   TONSILLECTOMY     Past Surgical History:  Procedure Laterality Date   BIOPSY  12/20/2017   Procedure: BIOPSY;  Surgeon: Rogene Houston, MD;  [facility];  Service: Endoscopy;;  cecal erosions   CESAREAN SECTION  06/24/82   [place] Bladder   COLONOSCOPY  Jan. 31, 2014   COLONOSCOPY  N/A 12/20/2017   Procedure: COLONOSCOPY;  Surgeon: Rogene Houston, MD;  [facility];  Service: Endoscopy;  Laterality: N/A;  2:25   [place]  2010 and 2011   TONSILLECTOMY     Past Medical History:  Diagnosis Date   Abdominal pain    Anemia    Back pain    Colitis    COPD (chronic obstructive pulmonary disease) ([place])    not on home o2   Crohn's disease ([place]) 03/29/12   Diabetes mellitus without complication (HCC)    Fibromyalgia    H/O breast biopsy    Hip pain    Hyperlipidemia    Hypothyroidism    Irritable bowel syndrome    Joint pain    Nonspecific abnormal finding in stool contents    Peripheral vascular disease (HCC)    Ulcer    Mouth   Ht 5' 3"  (1.6 m)   Wt 190 lb 9.6 oz (86.5 kg)   BMI 33.76 kg/m   Opioid Risk Score:   Fall Risk Score:  `1  Depression screen [facility] 2/9     06/08/2021    2:19 PM 11/17/2020    1:41 PM 06/16/2020    1:38 PM 10/24/2019    1:34 PM 03/26/2019   12:41 PM  Depression  screen PHQ 2/9  Decreased Interest 0 0 0 1 2  Down, Depressed, Hopeless 0 0 0 1 1  PHQ - 2 Score 0 0 0 2 3  Altered sleeping     2  Tired, decreased energy     2  Change in appetite     0  Feeling bad or failure about yourself      0  Trouble concentrating     2  Moving slowly or fidgety/restless     1  Suicidal thoughts     0  PHQ-9 Score     10      Review of Systems  Musculoskeletal:  Positive for back pain.       B/L knee pain Left shoulder pain   All other systems reviewed and are negative.     Objective:   Physical Exam        Assessment & Plan:  Bilateral Shoulder Pain: Continue HEP as Tolerated. Continue current medication regimen. Continue to Monitor.  Lower Back Pain without Sciatica: Continue HEP as Tolerated. Continue current medication regimen. Continue to Monitor.  Primary OA in Bilateral Knees: Continue HEP as Tolerated. Continue Current Medication Regimen. Continue to Monitor.  Fibromyalgia: Continue HEP as Tolerated. Continue Lyrica.  Continue to Monitor.  Chronic Pain Syndrome: Refilled: Tramadol 50 mg three times a day as needed for pain #90. We will continue the opioid monitoring program, this consists of regular clinic visits, examinations, urine drug screen, pill counts as well as use of [place] Controlled Substance Reporting system. A 12 month History has been reviewed on the [place] Controlled Substance Reporting System on 06/08/2021. F/U in 6 months

## 2021-12-06 ENCOUNTER — Ambulatory Visit (INDEPENDENT_AMBULATORY_CARE_PROVIDER_SITE_OTHER): Payer: Medicare HMO | Admitting: Gastroenterology

## 2021-12-06 ENCOUNTER — Encounter: Payer: Self-pay | Admitting: Registered Nurse

## 2021-12-06 DIAGNOSIS — R195 Other fecal abnormalities: Secondary | ICD-10-CM

## 2021-12-06 DIAGNOSIS — K5010 Crohn's disease of large intestine without complications: Secondary | ICD-10-CM | POA: Diagnosis not present

## 2021-12-06 NOTE — Patient Instructions (Addendum)
We will check inflammatory markers to see how your disease is doing, as you seem to be doing well clinically, however, last colonoscopy was with active disease. If inflammatory markers are elevated, we may need to consider a different therapy for your Crohn's disease other than mesalamine   Follow up 6 months

## 2021-12-06 NOTE — Progress Notes (Unsigned)
Primary Care Physician:  Glenda Chroman, MD  Primary GI: previously [facility]  Patient Location: Home   Provider Location: [facility]   Reason for Visit: Follow up of Crohn's disease    Persons present on the virtual encounter, with roles: Zakari Couchman L. Alver Sorrow, MSN, APRN, AGNP-C, Nida Boatman    Total time (minutes) spent on medical discussion: 10 minutes  Virtual Visit via telephone visit is conducted virtually and was requested by patient.   I connected with Nida Boatman on 12/06/21 at  4:00 PM EST by telephone and verified that I am speaking with the correct person using two identifiers.   I discussed the limitations, risks, security and privacy concerns of performing an evaluation and management service by telephone and the availability of in person appointments. I also discussed with the patient that there may be a patient responsible charge related to this service. The patient expressed understanding and agreed to proceed.  Chief Complaint  Patient presents with   Crohn's Disease    Phone visit for Follow up on Crohn's disease. Needs refill on mesalamine. Reports doing well and no concerns today.    History of Present Illness: Desiree Hogan is a 66 year old female with pmh of Crohn's disease, anemia, COPD, DM, fibromyalgia, HLD, Hypothyroidism, IBS, PVD who presents today for Crohn's follow up.  Last seen in October 2022 Crohn's colitis she was diagnosed in 2014.  She has been maintained on oral mesalamine. She also has history of diarrhea often than constipation felt to be due to IBS. At last visit she was doing well, having intermittent n/v. Using dicyclomine 1-3x/week. 1 formed stool daily with some BRBPR each BM.   Recommended to check fecal calprotectin which was 152 (nov 2022), recommended to repeat in 3 months which was not done.   Last labs in June with sodium 134, AST 43, WBC 13.9, abs neutrophils 12.2, abs lymph 0.6.  Present: States that she is doing  pretty good. She has some occasional abdominal spasms which she takes dicyclomine for with good results, using this fairly infrequently. She is having 1 BM per day with looser stools. Denies rectal bleeding. She has no changes in appetite or weight loss. She has no melena, nausea or vomiting. Had recent labs with PCP in November.   Extraintestinal Manifestations: Skin: no rashes or lesions  Joints: has joint pain secondary to her fibromyalgia  Eyes:no vision changes  Flu shot: none in 2023 Covid: none Pneumonia: 2022   Last colonoscopy: 2019 when she was noted to have cecal erosions and scar at hepatic flexure and diverticulosis without diverticulitis. Cecal biopsy revealed changes consistent with Crohn's disease.  Past Medical History:  Diagnosis Date   Abdominal pain    Anemia    Back pain    Colitis    COPD (chronic obstructive pulmonary disease) (HCC)    not on home o2   Crohn's disease ([place]) 03/29/12   Diabetes mellitus without complication (HCC)    Fibromyalgia    H/O breast biopsy    Hip pain    Hyperlipidemia    Hypothyroidism    Irritable bowel syndrome    Joint pain    Nonspecific abnormal finding in stool contents    Peripheral vascular disease ([place])    Ulcer    Mouth     Past Surgical History:  Procedure Laterality Date   BIOPSY  12/20/2017   Procedure: BIOPSY;  Surgeon: Rogene Houston, MD;  [facility]: [facility];  Service: Endoscopy;;  cecal erosions   CESAREAN SECTION  06/24/82   [place] Bladder   COLONOSCOPY  Jan. 31, 2014   COLONOSCOPY N/A 12/20/2017   Procedure: COLONOSCOPY;  Surgeon: Rogene Houston, MD;  Location: [facility];  Service: Endoscopy;  Laterality: N/A;  2:25   [place]  2010 and 2011   TONSILLECTOMY       Current Meds  Medication Sig   alendronate (FOSAMAX) 70 MG tablet Take 70 mg by mouth every Friday. Take with a full glass of water on an empty stomach.   BREO ELLIPTA 100-25 MCG/INH AEPB Inhale 1  puff into the lungs daily.    CALCIUM GLUCONATE PO Take 600 mg by mouth 2 (two) times daily.    cetirizine (ZYRTEC) 10 MG tablet Take 10 mg by mouth daily.   cholecalciferol (VITAMIN D) 1000 units tablet Take 1,000 Units by mouth 2 (two) times daily.    dicyclomine (BENTYL) 10 MG capsule TAKE 1 CAPSULE (10 MG TOTAL) BY MOUTH 3 (THREE) TIMES DAILY AS NEEDED FOR SPASMS.   DULoxetine (CYMBALTA) 30 MG capsule TAKE 1 CAPSULE BY MOUTH EVERY DAY   levothyroxine (SYNTHROID, LEVOTHROID) 125 MCG tablet Take 125 mcg by mouth daily.   lisinopril (ZESTRIL) 2.5 MG tablet Take 2.5 mg by mouth daily.   Mesalamine (ASACOL) 400 MG CPDR DR capsule TAKE 2 CAPSULES (800 MG TOTAL) BY MOUTH 2 (TWO) TIMES DAILY.   ondansetron (ZOFRAN) 4 MG tablet TAKE 1 TABLET (4 MG TOTAL) BY MOUTH 2 (TWO) TIMES DAILY AS NEEDED FOR NAUSEA OR VOMITING.   pregabalin (LYRICA) 50 MG capsule Take 1 capsule (50 mg total) by mouth 2 (two) times daily.   PROAIR HFA 108 (90 Base) MCG/ACT inhaler Inhale 1-2 puffs into the lungs every 6 (six) hours as needed for wheezing or shortness of breath.    rosuvastatin (CRESTOR) 10 MG tablet Take 10 mg by mouth daily.   tiZANidine (ZANAFLEX) 4 MG tablet TAKE 1 TABLET BY MOUTH AT BEDTIME AS NEEDED FOR MUSCLE SPASMS.   traMADol (ULTRAM) 50 MG tablet Take 1 tablet (50 mg total) by mouth 3 (three) times daily as needed.   [DISCONTINUED] gemfibrozil (LOPID) 600 MG tablet Take 600 mg by mouth 2 (two) times daily before a meal.     Family History  Problem Relation Age of Onset   Cancer Mother        Colon or vaginal ?   Deep vein thrombosis Mother    Hypertension Father    Diabetes Father    Heart disease Father        Heart Disease before age 56   Diabetes Sister    Healthy Daughter    Healthy Daughter    Healthy Daughter    Diabetes Brother    Hypertension Brother    Healthy Son    Breast cancer Neg Hx     Social History   Socioeconomic History   Marital status: Married    Spouse name: Not  on file   Number of children: Not on file   Years of education: Not on file   Highest education level: Not on file  Occupational History   Not on file  Tobacco Use   Smoking status: Former    Packs/day: 0.75    Years: 20.00    Total pack years: 15.00    Types: Cigarettes    Quit date: 01/03/2009    Years since quitting: 12.9   Smokeless tobacco: Never  Vaping Use   Vaping Use: Never used  Substance and Sexual Activity   Alcohol use: No   Drug use: No   Sexual activity: Not on file  Other Topics Concern   Not on file  Social History Narrative   Not on file   Social Determinants of Health   Financial Resource Strain: Not on file  Food Insecurity: Not on file  Transportation Needs: Not on file  Physical Activity: Not on file  Stress: Not on file  Social Connections: Not on file   Review of Systems: Gen: Denies fever, chills, anorexia. Denies fatigue, weakness, weight loss.  CV: Denies chest pain, palpitations, syncope, peripheral edema, and claudication. Resp: Denies dyspnea at rest, cough, wheezing, coughing up blood, and pleurisy. GI: see HPI Derm: Denies rash, itching, dry skin Psych: Denies depression, anxiety, memory loss, confusion. No homicidal or suicidal ideation.  Heme: Denies bruising, bleeding, and enlarged lymph nodes.  Observations/Objective: No distress. Unable to perform physical exam due to telephone encounter. No video available.   Assessment and Plan: Desiree Hogan is a 66 year old female who presents virtually for follow up of Crohn's disease.  Patient doing well clinically with no rectal bleeding, having 1 stool per day that is soft to loose. She denies overt abdominal pain but has occasional abdominal spasms relieved by dicyclomine. Last fecal calprotectin was 152 in nov 2022, she was lost to follow up for repeat one 3 months after as previously recommended, last TCS in 2019 showed active disease. She has been maintained on mesalamine and though  she appears to be doing well clinically, I am concerned her disease may not be well controlled on an endoscopic level. Will proceed with repeat fecal calprotectin and CRP since she is not having overt alarming GI symptoms, however, if these are elevated, would recommend repeating Colonoscopy for further evaluation, and depending on colonoscopy findings, may need to further discuss other treatment options such as biologic therapy for her as there is little data to support mesalamine utilization for ultimate control of Crohn's disease.    -Check fecal calprotectin and CRP -TB quant and Hep B serologies  -repeat colonoscopy if inflammatory markers are elevated -obtain recent labs from PCP -continue dicyclomine PRN  Follow Up Instructions: 6 months    I discussed the assessment and treatment plan with the patient. The patient was provided an opportunity to ask questions and all were answered. The patient agreed with the plan and demonstrated an understanding of the instructions.   The patient was advised to call back or seek an in-person evaluation if the symptoms worsen or if the condition fails to improve as anticipated.  I provided 10 minutes of Non face-to-face time during this telephone encounter  [place] Alver Sorrow, MSN, APRN, AGNP-C Adult-Gerontology Nurse Practitioner [facility]  I have reviewed the note and agree with the APP's assessment as described in this progress note  Maylon Peppers, MD Gastroenterology and Hepatology [facility] Gastroenterology

## 2021-12-08 ENCOUNTER — Ambulatory Visit: Payer: Medicare HMO | Attending: Rheumatology | Admitting: Rheumatology

## 2021-12-08 ENCOUNTER — Encounter: Payer: Self-pay | Admitting: Rheumatology

## 2021-12-08 VITALS — BP 135/86 | HR 94 | Resp 18 | Ht 63.0 in | Wt 189.6 lb

## 2021-12-08 DIAGNOSIS — M533 Sacrococcygeal disorders, not elsewhere classified: Secondary | ICD-10-CM | POA: Diagnosis not present

## 2021-12-08 DIAGNOSIS — M810 Age-related osteoporosis without current pathological fracture: Secondary | ICD-10-CM

## 2021-12-08 DIAGNOSIS — G4709 Other insomnia: Secondary | ICD-10-CM

## 2021-12-08 DIAGNOSIS — M249 Joint derangement, unspecified: Secondary | ICD-10-CM | POA: Diagnosis not present

## 2021-12-08 DIAGNOSIS — Z9049 Acquired absence of other specified parts of digestive tract: Secondary | ICD-10-CM

## 2021-12-08 DIAGNOSIS — M5136 Other intervertebral disc degeneration, lumbar region: Secondary | ICD-10-CM | POA: Diagnosis not present

## 2021-12-08 DIAGNOSIS — M62838 Other muscle spasm: Secondary | ICD-10-CM | POA: Diagnosis not present

## 2021-12-08 DIAGNOSIS — Z8709 Personal history of other diseases of the respiratory system: Secondary | ICD-10-CM

## 2021-12-08 DIAGNOSIS — G8929 Other chronic pain: Secondary | ICD-10-CM

## 2021-12-08 DIAGNOSIS — Z8639 Personal history of other endocrine, nutritional and metabolic disease: Secondary | ICD-10-CM

## 2021-12-08 DIAGNOSIS — R768 Other specified abnormal immunological findings in serum: Secondary | ICD-10-CM | POA: Diagnosis not present

## 2021-12-08 DIAGNOSIS — M170 Bilateral primary osteoarthritis of knee: Secondary | ICD-10-CM

## 2021-12-08 DIAGNOSIS — M797 Fibromyalgia: Secondary | ICD-10-CM | POA: Diagnosis not present

## 2021-12-08 DIAGNOSIS — Z8719 Personal history of other diseases of the digestive system: Secondary | ICD-10-CM

## 2021-12-08 LAB — TOXASSURE SELECT,+ANTIDEPR,UR

## 2021-12-08 MED ORDER — LIDOCAINE HCL 1 % IJ SOLN
0.5000 mL | INTRAMUSCULAR | Status: AC | PRN
  Administered 2021-12-08: .5 mL

## 2021-12-08 MED ORDER — TRIAMCINOLONE ACETONIDE 40 MG/ML IJ SUSP
10.0000 mg | INTRAMUSCULAR | Status: AC | PRN
  Administered 2021-12-08: 10 mg via INTRAMUSCULAR

## 2021-12-08 NOTE — Patient Instructions (Signed)
Cervical Strain and Sprain Rehab Ask your health care provider which exercises are safe for you. Do exercises exactly as told by your health care provider and adjust them as directed. It is normal to feel mild stretching, pulling, tightness, or discomfort as you do these exercises. Stop right away if you feel sudden pain or your pain gets worse. Do not begin these exercises until told by your health care provider. Stretching and range-of-motion exercises Cervical side bending  Using good posture, sit on a stable chair or stand up. Without moving your shoulders, slowly tilt your left / right ear to your shoulder until you feel a stretch in the neck muscles on the opposite side. You should be looking straight ahead. Hold for __________ seconds. Repeat with the other side of your neck. Repeat __________ times. Complete this exercise __________ times a day. Cervical rotation  Using good posture, sit on a stable chair or stand up. Slowly turn your head to the side as if you are looking over your left / right shoulder. Keep your eyes level with the ground. Stop when you feel a stretch along the side and the back of your neck. Hold for __________ seconds. Repeat this by turning to your other side. Repeat __________ times. Complete this exercise __________ times a day. Thoracic extension and pectoral stretch  Roll a towel or a small blanket so it is about 4 inches (10 cm) in diameter. Lie down on your back on a firm surface. Put the towel in the middle of your back across your spine. It should not be under your shoulder blades. Put your hands behind your head and let your elbows fall out to your sides. Hold for __________ seconds. Repeat __________ times. Complete this exercise __________ times a day. Strengthening exercises Upper cervical flexion  Lie on your back with a thin pillow behind your head or a small, rolled-up towel under your neck. Gently tuck your chin toward your chest and nod  your head down to look toward your feet. Do not lift your head off the pillow. Hold for __________ seconds. Release the tension slowly. Relax your neck muscles completely before you repeat this exercise. Repeat __________ times. Complete this exercise __________ times a day. Cervical extension  Stand about 6 inches (15 cm) away from a wall, with your back facing the wall. Place a soft object, about 6-8 inches (15-20 cm) in diameter, between the back of your head and the wall. A soft object could be a small pillow, a ball, or a folded towel. Gently tilt your head back and press into the soft object. Keep your jaw and forehead relaxed. Hold for __________ seconds. Release the tension slowly. Relax your neck muscles completely before you repeat this exercise. Repeat __________ times. Complete this exercise __________ times a day. Posture and body mechanics Body mechanics refer to the movements and positions of your body while you do your daily activities. Posture is part of body mechanics. Good posture and healthy body mechanics can help to relieve stress in your body's tissues and joints. Good posture means that your spine is in its natural S-curve position (your spine is neutral), your shoulders are pulled back slightly, and your head is not tipped forward. The following are general guidelines for using improved posture and body mechanics in your everyday activities. Sitting  When sitting, keep your spine neutral and keep your feet flat on the floor. Use a footrest, if needed, and keep your thighs parallel to the floor. Avoid rounding  your shoulders. Avoid tilting your head forward. When working at a desk or a computer, keep your desk at a height where your hands are slightly lower than your elbows. Slide your chair under your desk so you are close enough to maintain good posture. When working at a computer, place your monitor at a height where you are looking straight ahead and you do not have to  tilt your head forward or downward to look at the screen. Standing  When standing, keep your spine neutral and keep your feet about hip-width apart. Keep a slight bend in your knees. Your ears, shoulders, and hips should line up. When you do a task in which you stand in one place for a long time, place one foot up on a stable object that is 2-4 inches (5-10 cm) high, such as a footstool. This helps keep your spine neutral. Resting When lying down and resting, avoid positions that are most painful for you. Try to support your neck in a neutral position. You can use a contour pillow or a small rolled-up towel. Your pillow should support your neck but not push on it. This information is not intended to replace advice given to you by your health care provider. Make sure you discuss any questions you have with your health care provider. Document Revised: 07/12/2021 Document Reviewed: 07/12/2021 Elsevier Patient Education  [place].

## 2021-12-13 ENCOUNTER — Other Ambulatory Visit: Payer: Self-pay | Admitting: Physician Assistant

## 2021-12-13 ENCOUNTER — Other Ambulatory Visit (INDEPENDENT_AMBULATORY_CARE_PROVIDER_SITE_OTHER): Payer: Self-pay | Admitting: Gastroenterology

## 2021-12-13 DIAGNOSIS — K5010 Crohn's disease of large intestine without complications: Secondary | ICD-10-CM

## 2021-12-13 DIAGNOSIS — K50118 Crohn's disease of large intestine with other complication: Secondary | ICD-10-CM

## 2021-12-13 NOTE — Telephone Encounter (Signed)
Seen 12/06/21

## 2021-12-13 NOTE — Telephone Encounter (Signed)
Next Visit: 06/10/2022  Last Visit: 12/08/2021  Last Fill: 09/13/2021  DX: Fibromyalgia    Current Dose per office note on 12/08/2021: tizanidine 4 mg p.o. nightly   Okay to refill tizanidine?

## 2021-12-23 ENCOUNTER — Telehealth: Payer: Self-pay | Admitting: *Deleted

## 2021-12-23 NOTE — Telephone Encounter (Signed)
Urine drug screen for this encounter is consistent for prescribed medication 

## 2022-01-19 DIAGNOSIS — R195 Other fecal abnormalities: Secondary | ICD-10-CM | POA: Diagnosis not present

## 2022-01-19 DIAGNOSIS — K5010 Crohn's disease of large intestine without complications: Secondary | ICD-10-CM | POA: Diagnosis not present

## 2022-01-22 LAB — C-REACTIVE PROTEIN: CRP: 1 mg/L (ref 0–10)

## 2022-01-22 LAB — QUANTIFERON-TB GOLD PLUS
QuantiFERON Mitogen Value: >10 IU/mL
QuantiFERON Nil Value: 0 IU/mL
QuantiFERON TB1 Ag Value: 0 IU/mL
QuantiFERON TB2 Ag Value: 0 IU/mL
QuantiFERON-TB Gold Plus: NEGATIVE

## 2022-01-22 LAB — HEPATITIS B SURFACE ANTIGEN: Hepatitis B Surface Ag: NEGATIVE

## 2022-01-25 ENCOUNTER — Other Ambulatory Visit: Payer: Self-pay | Admitting: Physician Assistant

## 2022-01-26 ENCOUNTER — Other Ambulatory Visit: Payer: Self-pay | Admitting: Gastroenterology

## 2022-01-26 DIAGNOSIS — R195 Other fecal abnormalities: Secondary | ICD-10-CM | POA: Diagnosis not present

## 2022-01-26 DIAGNOSIS — K5010 Crohn's disease of large intestine without complications: Secondary | ICD-10-CM | POA: Diagnosis not present

## 2022-01-29 LAB — CALPROTECTIN, FECAL: Calprotectin, Fecal: 323 ug/g — ABNORMAL HIGH (ref 0–120)

## 2022-01-31 ENCOUNTER — Telehealth (INDEPENDENT_AMBULATORY_CARE_PROVIDER_SITE_OTHER): Payer: Self-pay

## 2022-02-14 ENCOUNTER — Encounter (INDEPENDENT_AMBULATORY_CARE_PROVIDER_SITE_OTHER): Payer: Self-pay | Admitting: *Deleted

## 2022-02-15 ENCOUNTER — Other Ambulatory Visit (INDEPENDENT_AMBULATORY_CARE_PROVIDER_SITE_OTHER): Payer: Self-pay | Admitting: Gastroenterology

## 2022-02-15 ENCOUNTER — Other Ambulatory Visit: Payer: Self-pay | Admitting: Physician Assistant

## 2022-02-15 DIAGNOSIS — K50118 Crohn's disease of large intestine with other complication: Secondary | ICD-10-CM

## 2022-02-15 DIAGNOSIS — K5010 Crohn's disease of large intestine without complications: Secondary | ICD-10-CM

## 2022-02-15 NOTE — Telephone Encounter (Signed)
Next Visit: 06/10/2022   Last Visit: 12/08/2021   Last Fill: 11/29/2021  DX: Fibromyalgia     Current Dose per office note on 12/08/2021:Cymbalta 30 mg p.o. daily   Okay to refill Cymbalta?

## 2022-03-10 ENCOUNTER — Other Ambulatory Visit: Payer: Self-pay | Admitting: Physician Assistant

## 2022-03-10 NOTE — Telephone Encounter (Signed)
Next Visit: 06/10/2022   Last Visit: 12/08/2021   Last Fill: 12/13/2021   DX: Fibromyalgia     Current Dose per office note on 12/08/2021: tizanidine 4 mg p.o. nightly    Okay to refill tizanidine?

## 2022-03-21 DIAGNOSIS — R69 Illness, unspecified: Secondary | ICD-10-CM | POA: Diagnosis not present

## 2022-03-21 DIAGNOSIS — I10 Essential (primary) hypertension: Secondary | ICD-10-CM | POA: Diagnosis not present

## 2022-03-21 DIAGNOSIS — Z299 Encounter for prophylactic measures, unspecified: Secondary | ICD-10-CM | POA: Diagnosis not present

## 2022-03-21 DIAGNOSIS — E1165 Type 2 diabetes mellitus with hyperglycemia: Secondary | ICD-10-CM | POA: Diagnosis not present

## 2022-03-21 DIAGNOSIS — E039 Hypothyroidism, unspecified: Secondary | ICD-10-CM | POA: Diagnosis not present

## 2022-03-21 DIAGNOSIS — K5090 Crohn's disease, unspecified, without complications: Secondary | ICD-10-CM | POA: Diagnosis not present

## 2022-04-16 ENCOUNTER — Other Ambulatory Visit: Payer: Self-pay | Admitting: Physician Assistant

## 2022-04-18 NOTE — Telephone Encounter (Signed)
Last Fill: 02/15/2022  Next Visit: 06/10/2022  Last Visit: 12/08/2021  Dx: Fibromyalgia   Current Dose per office note on 12/08/2021: Cymbalta 30 mg p.o. daily   Okay to refill Cymbalta?

## 2022-04-19 ENCOUNTER — Other Ambulatory Visit: Payer: Self-pay | Admitting: Rheumatology

## 2022-05-20 ENCOUNTER — Other Ambulatory Visit: Payer: Self-pay | Admitting: Rheumatology

## 2022-06-01 NOTE — Progress Notes (Signed)
Office Visit Note  Patient: Desiree Hogan             Date of Birth: 1955/03/16           MRN: [id number]             PCP: Ignatius Specking, MD Referring: Ignatius Specking, MD Visit Date: 06/10/2022 Occupation: @GUAROCC @  Subjective:  Trapezius muscle spasms   History of Present Illness: Desiree Hogan is a 67 y.o. female with history of fibromyalgia, DDD, and osteoarthritis.  She remains on Cymbalta 30 mg 1 capsule by mouth daily and Lyrica 50 mg 1 capsule twice daily.  She is tolerating these medications without any side effects.  She continues to have intermittent myalgias and muscle tenderness due to fibromyalgia.  She has had interrupted sleep at night despite taking melatonin 10 mg at bedtime.  She presents today with trapezius muscle tension tenderness bilaterally.  She requested trigger point injections today. She had trapezius trigger point injections performed on 12/08/2021.  She takes tramadol 50 mg 1 tablet 3 times daily as needed for pain relief and tizanidine 4 mg 1 tablet at bedtime for muscle spasms.    Activities of Daily Living:  Patient reports morning stiffness for 1 hour.   Patient Reports nocturnal pain.  Difficulty dressing/grooming: Denies Difficulty climbing stairs: Denies Difficulty getting out of chair: Denies Difficulty using hands for taps, buttons, cutlery, and/or writing: Denies  Review of Systems  Constitutional:  Positive for fatigue.  HENT:  Positive for mouth sores and mouth dryness.   Eyes:  Negative for dryness.  Respiratory:  Negative for shortness of breath.   Cardiovascular:  Negative for chest pain and palpitations.  Gastrointestinal:  Positive for blood in stool. Negative for constipation and diarrhea.  Endocrine: Negative for increased urination.  Genitourinary:  Negative for involuntary urination.  Musculoskeletal:  Positive for joint pain, joint pain, joint swelling, myalgias, muscle weakness, morning stiffness, muscle tenderness and  myalgias. Negative for gait problem.  Skin:  Negative for color change, rash, hair loss and sensitivity to sunlight.  Allergic/Immunologic: Positive for susceptible to infections.  Neurological:  Positive for headaches. Negative for dizziness.  Hematological:  Negative for swollen glands.  Psychiatric/Behavioral:  Positive for sleep disturbance. Negative for depressed mood. The patient is nervous/anxious.     PMFS History:  Patient Active Problem List   Diagnosis Date Noted   Elevated fecal calprotectin 12/06/2021   Nausea without vomiting 10/20/2020   Abdominal pain 07/17/2020   Diarrhea 06/23/2020   Diverticulitis of colon 10/11/2017   Crohn's colitis (HCC) 10/11/2017   Fibromyalgia 07/14/2017   ANA positive 07/14/2017   Primary osteoarthritis of both knees 07/14/2017   DDD (degenerative disc disease), lumbar 07/14/2017   Hypermobility of joint 07/14/2017   Age-related osteoporosis without current pathological fracture 07/14/2017   Other insomnia 07/14/2017   History of Crohn's disease 07/14/2017   History of IBS 07/14/2017   History of hypercholesterolemia 07/14/2017   History of COPD 07/14/2017   History of cholecystectomy 07/14/2017   Community acquired pneumonia 12/13/2016   Pneumonia 12/13/2016   Leukocytosis 12/13/2016   Hypokalemia 12/13/2016   Diabetes mellitus without complication (HCC) 12/13/2016    Past Medical History:  Diagnosis Date   Abdominal pain    Anemia    Back pain    Colitis    COPD (chronic obstructive pulmonary disease) (HCC)    not on home o2   Crohn's disease (HCC) 03/29/12   Diabetes mellitus without complication (  HCC)    Fibromyalgia    H/O breast biopsy    Hip pain    Hyperlipidemia    Hypothyroidism    Irritable bowel syndrome    Joint pain    Nonspecific abnormal finding in stool contents    Peripheral vascular disease (HCC)    Ulcer    Mouth    Family History  Problem Relation Age of Onset   Cancer Mother        Colon or  vaginal ?   Deep vein thrombosis Mother    Hypertension Father    Diabetes Father    Heart disease Father        Heart Disease before age 45   Diabetes Sister    Diabetes Brother    Hypertension Brother    Healthy Daughter    Healthy Daughter    Healthy Daughter    Healthy Son    Breast cancer Neg Hx    Past Surgical History:  Procedure Laterality Date   BIOPSY  12/20/2017   Procedure: BIOPSY;  Surgeon: Malissa Hippo, MD;  [facility];  Service: Endoscopy;;  cecal erosions   CESAREAN SECTION  06/24/82   CHOLECYSTECTOMY  1993   Gall Bladder   COLONOSCOPY  Jan. 31, 2014   COLONOSCOPY N/A 12/20/2017   Procedure: COLONOSCOPY;  Surgeon: Malissa Hippo, MD;  [facility]: [facility];  Service: Endoscopy;  Laterality: N/A;  2:25   SPINE SURGERY  2010 and 2011   TONSILLECTOMY     Social History   Social History Narrative   Not on file    There is no immunization history on file for this patient.   Objective: Vital Signs: BP 126/68 (BP Location: Right Arm, Patient Position: Sitting, Cuff Size: Normal)   Pulse 83   Resp 16   Ht 5\' 3"  (1.6 m)   Wt 189 lb (85.7 kg)   BMI 33.48 kg/m    Physical Exam Vitals and nursing note reviewed.  Constitutional:      Appearance: She is well-developed.  HENT:     Head: Normocephalic and atraumatic.  Eyes:     Conjunctiva/sclera: Conjunctivae normal.  Cardiovascular:     Rate and Rhythm: Normal rate and regular rhythm.     Heart sounds: Normal heart sounds.  Pulmonary:     Effort: Pulmonary effort is normal.     Breath sounds: Normal breath sounds.  Abdominal:     General: Bowel sounds are normal.     Palpations: Abdomen is soft.  Musculoskeletal:     Cervical back: Normal range of motion.  Lymphadenopathy:     Cervical: No cervical adenopathy.  Skin:    General: Skin is warm and dry.     Capillary Refill: Capillary refill takes less than 2 seconds.  Neurological:     Mental Status: She is alert and oriented  to person, place, and time.  Psychiatric:        Behavior: Behavior normal.      Musculoskeletal Exam: Generalized hyperalgesia and positive tender points on exam.  C-spine has limited range of motion.  Trapezius muscle tension tenderness bilaterally.  Shoulder joints have limited abduction to about 110 degrees bilaterally.  Painful internal rotation of both shoulders.  Elbow joints, wrist joints, MCPs, PIPs, DIPs have good range of motion with no synovitis.  PIP and DIP thickening consistent with osteoarthritis of both hands.  Hip joints have good range of motion with no groin pain.  Knee joints have good  range of motion with no warmth or effusion.  Ankle joints have good range of motion with no joint tenderness or synovitis.  CDAI Exam: CDAI Score: -- Patient Global: --; Provider Global: -- Swollen: --; Tender: -- Joint Exam 06/10/2022   No joint exam has been documented for this visit   There is currently no information documented on the homunculus. Go to the Rheumatology activity and complete the homunculus joint exam.  Investigation: No additional findings.  Imaging: No results found.  Recent Labs: Lab Results  Component Value Date   WBC 9.3 06/07/2022   HGB 12.9 06/07/2022   PLT 247 06/07/2022   NA 137 06/07/2022   K 5.2 06/07/2022   CL 99 06/07/2022   CO2 24 06/07/2022   GLUCOSE 147 (H) 06/07/2022   BUN 16 06/07/2022   CREATININE 0.86 06/07/2022   BILITOT 0.4 06/07/2022   ALKPHOS 90 06/07/2022   AST 44 (H) 06/07/2022   ALT 20 06/07/2022   PROT 8.4 06/07/2022   ALBUMIN 4.8 06/07/2022   CALCIUM 10.3 06/07/2022   GFRAA >60 09/01/2017   QFTBGOLDPLUS Negative 01/19/2022    Speciality Comments: No specialty comments available.  Procedures:  Trigger Point Inj  Date/Time: 06/10/2022 10:11 AM  Performed by: Gearldine Bienenstock, PA-C Authorized by: Gearldine Bienenstock, PA-C   Consent Given by:  Patient Site marked: the procedure site was marked   Timeout: prior to  procedure the correct patient, procedure, and site was verified   Indications:  Pain Total # of Trigger Points:  2 Location: neck   Needle Size:  27 G Approach:  Dorsal Medications #1:  0.5 mL lidocaine 1 %; 10 mg triamcinolone acetonide 40 MG/ML Medications #2:  0.5 mL lidocaine 1 %; 10 mg triamcinolone acetonide 40 MG/ML Patient tolerance:  Patient tolerated the procedure well with no immediate complications  Allergies: Bactrim [sulfamethoxazole-trimethoprim], Penicillins, Tetracyclines & related, and Ciprofloxacin   Assessment / Plan:     Visit Diagnoses: Fibromyalgia - She has generalized hyperalgesia and positive tender points on examination.  She experiences intermittent fibromyalgia flares typically with no identifiable trigger.  During her flare she typically has to rest and recuperate.  She remains on Cymbalta and Lyrica as prescribed.  She has been taking tizanidine 4 mg at bedtime as needed for muscle spasms and remains on tramadol 50 mg 1 tablet 3 times daily for pain relief.   She presents today with trapezius muscle tension and tenderness bilaterally.  Trigger point injections were performed today.  She tolerated procedures well.  Procedure notes were completed above.  Aftercare was discussed.  She was advised to notify us if her symptoms persist or worsen.  Discussed the importance of regular exercise and good sleep hygiene.  She will follow-up in the office in 6 months or sooner if needed.  Trapezius muscle spasm: She has trapezius muscle tension tenderness bilaterally.  She is been experiencing muscle spasms intermittently.  She takes tizanidine 4 mg at bedtime as needed for muscle spasms.  She had trigger point injections bilaterally on 12/08/2021 which righted temporary relief but her symptoms have returned.  She requested repeat trigger point injections today.  She tolerated the procedures well.  Procedure notes were completed above.  Aftercare was discussed.  ANA positive - +RF,  -CCP: No clinical features of systemic lupus at this time.  Chronic SI joint pain: No SI joint discomfort currently.  Primary osteoarthritis of both knees: Good range of motion of both knee joints.  No warmth or effusion  noted.  DDD (degenerative disc disease), lumbar: She is not experiencing any increased discomfort in her lower back currently.  Hypermobility of joint  Age-related osteoporosis without current pathological fracture -Managed by PCP. She takes Fosamax 70 mg 1 tablet by mouth once weekly for management of osteoporosis.  No DEXA in Epic to review.  Other medical conditions are listed as follows:   Other insomnia  History of hypercholesterolemia  History of COPD  History of Crohn's disease  History of IBS  History of cholecystectomy  Orders: Orders Placed This Encounter  Procedures   Trigger Point Inj   No orders of the defined types were placed in this encounter.     Follow-Up Instructions: Return in about 6 months (around 12/10/2022) for Fibromyalgia, Osteoarthritis, DDD.   Gearldine Bienenstock, PA-C  Note - This record has been created using Dragon software.  Chart creation errors have been sought, but may not always  have been located. Such creation errors do not reflect on  the standard of medical care.

## 2022-06-03 ENCOUNTER — Other Ambulatory Visit: Payer: Self-pay | Admitting: Rheumatology

## 2022-06-03 ENCOUNTER — Other Ambulatory Visit: Payer: Self-pay | Admitting: Registered Nurse

## 2022-06-03 NOTE — Telephone Encounter (Signed)
Last Fill: 03/10/2022  Next Visit: 06/10/2022  Last Visit: 12/09/2022  Dx: Fibromyalgia   Current Dose per office note on 12/08/2021:tizanidine 4 mg p.o. nightly    Okay to refill Tizanidine?

## 2022-06-03 NOTE — Telephone Encounter (Signed)
PMP was Reviewed.  Lyrica E-scribed today.  Desiree Hogan has a scheduled appointment in June.

## 2022-06-07 ENCOUNTER — Ambulatory Visit (INDEPENDENT_AMBULATORY_CARE_PROVIDER_SITE_OTHER): Payer: Medicare HMO | Admitting: Gastroenterology

## 2022-06-07 ENCOUNTER — Encounter (INDEPENDENT_AMBULATORY_CARE_PROVIDER_SITE_OTHER): Payer: Self-pay | Admitting: Gastroenterology

## 2022-06-07 ENCOUNTER — Other Ambulatory Visit (INDEPENDENT_AMBULATORY_CARE_PROVIDER_SITE_OTHER): Payer: Self-pay | Admitting: Gastroenterology

## 2022-06-07 VITALS — BP 129/80 | HR 91 | Temp 98.4°F | Ht 63.0 in | Wt 187.8 lb

## 2022-06-07 DIAGNOSIS — Z79899 Other long term (current) drug therapy: Secondary | ICD-10-CM | POA: Diagnosis not present

## 2022-06-07 DIAGNOSIS — Z1159 Encounter for screening for other viral diseases: Secondary | ICD-10-CM

## 2022-06-07 DIAGNOSIS — K5010 Crohn's disease of large intestine without complications: Secondary | ICD-10-CM

## 2022-06-07 NOTE — Patient Instructions (Signed)
We will check some labs today and get you scheduled for colonoscopy Pending findings of colonoscopy, we will discuss other treatment options for you to better manage your Crohn's disease  Follow up 3 months  It was a pleasure to see you today. I want to create trusting relationships with patients and provide genuine, compassionate, and quality care. I truly value your feedback! please be on the lookout for a survey regarding your visit with me today. I appreciate your input about our visit and your time in completing this!    Desiree Hogan L. Jeanmarie Hubert, MSN, APRN, AGNP-C Adult-Gerontology Nurse Practitioner [facility] Gastroenterology at [facility]

## 2022-06-07 NOTE — Progress Notes (Addendum)
Referring Provider: Ignatius Specking, MD Primary Care Physician:  Ignatius Specking, MD Primary GI Physician: Levon Hedger   Chief Complaint  Patient presents with   Crohn's Disease    Follow up on Crohn's disease. Having abdominal pain off and on for over a year. Has seen some blood in stools. Having diarrhea and abdominal cramping. Would like to discuss changing treatment today.    HPI:   Desiree Hogan is a 67 year old female with pmh of Crohn's disease, anemia, COPD, DM, fibromyalgia, HLD, Hypothyroidism, IBS, PVD    Patient presenting today for Crohn's disease follow up  Last seen December 2023, at that time, still maintained on mesalamine, doing pretty good. occasional abdominal spasms which she takes dicyclomine for with good results, using this fairly infrequently.  having 1 BM per day with looser stools. Denies rectal bleeding.    Recommended pt to consider other alternatives to mesalamine, checked fecal calprotectin, CRP, TB quant/Hep B, repeat colonoscopy if inflammatory markers elevated, continue dicyclomine   Calprotectin 323, crp 1, recommended to repeat Colonoscopy which patient was agreeable too but appears staff was never able to get in touch with her to schedule.  Present:  Patient states she does not feel that mesalamine is working well for her currently. She was on asacol previously which worked well for her but insurance stopped covering it. She can have a BM 5-7 BMs per day when she feels her stomach is "acting up." This is happening once every few weeks and will last 2-3 days. She will have loose to watery stools during these times. She has rectal bleeding and mucus in stools during these times as well. She notes abdominal discomfort during these times as well, usually mid to lower abdominal pain. Symptoms have been ongoing for a while, per patient. She has some nausea at times, rare episodes of vomiting. Appetite is not great, she has lost about 7 pounds in the past few  months. No melena. She takes bentyl when she has abdominal pain which helps some but does not alleviate the pain.    She states she attempted to get in touch to schedule colonoscopy previously but was never able to reach anyone.   Flu shot: last in 2023  Covid: none Pneumonia: 2022   Extraintestinal Manifestations: Skin: none  Joints: has chronic joint pain, she sees Rheum (told fibromyalgia) Eyes: none  Last colonoscopy: 2019 when she was noted to have cecal erosions and scar at hepatic flexure and diverticulosis without diverticulitis. Cecal biopsy revealed changes consistent with Crohn's disease.  Recommendations:    Past Medical History:  Diagnosis Date   Abdominal pain    Anemia    Back pain    Colitis    COPD (chronic obstructive pulmonary disease) (HCC)    not on home o2   Crohn's disease (HCC) 03/29/12   Diabetes mellitus without complication (HCC)    Fibromyalgia    H/O breast biopsy    Hip pain    Hyperlipidemia    Hypothyroidism    Irritable bowel syndrome    Joint pain    Nonspecific abnormal finding in stool contents    Peripheral vascular disease (HCC)    Ulcer    Mouth    Past Surgical History:  Procedure Laterality Date   BIOPSY  12/20/2017   Procedure: BIOPSY;  Surgeon: Malissa Hippo, MD;  [facility];  Service: Endoscopy;;  cecal erosions   CESAREAN SECTION  06/24/82   CHOLECYSTECTOMY  1993  Gall Bladder   COLONOSCOPY  Jan. 31, 2014   COLONOSCOPY N/A 12/20/2017   Procedure: COLONOSCOPY;  Surgeon: Malissa Hippo, MD;  [facility];  Service: Endoscopy;  Laterality: N/A;  2:25   SPINE SURGERY  2010 and 2011   TONSILLECTOMY      Current Outpatient Medications  Medication Sig Dispense Refill   alendronate (FOSAMAX) 70 MG tablet Take 70 mg by mouth every Friday. Take with a full glass of water on an empty stomach.     BREO ELLIPTA 100-25 MCG/INH AEPB Inhale 1 puff into the lungs daily.      CALCIUM GLUCONATE PO Take  600 mg by mouth 2 (two) times daily.      cetirizine (ZYRTEC) 10 MG tablet Take 10 mg by mouth daily.     dicyclomine (BENTYL) 10 MG capsule TAKE 1 CAPSULE (10 MG TOTAL) BY MOUTH 3 (THREE) TIMES DAILY AS NEEDED FOR SPASMS. 270 capsule 1   DULoxetine (CYMBALTA) 30 MG capsule TAKE 1 CAPSULE BY MOUTH EVERY DAY 90 capsule 0   levothyroxine (SYNTHROID, LEVOTHROID) 125 MCG tablet Take 125 mcg by mouth daily.  3   lisinopril (ZESTRIL) 2.5 MG tablet Take 2.5 mg by mouth daily.     Mesalamine (ASACOL) 400 MG CPDR DR capsule TAKE 2 CAPSULES (800 MG TOTAL) BY MOUTH 2 (TWO) TIMES DAILY. 360 capsule 0   ondansetron (ZOFRAN) 4 MG tablet TAKE 1 TABLET (4 MG TOTAL) BY MOUTH 2 (TWO) TIMES DAILY AS NEEDED FOR NAUSEA OR VOMITING. 30 tablet 1   pregabalin (LYRICA) 50 MG capsule TAKE 1 CAPSULE BY MOUTH 2 TIMES DAILY. 60 capsule 5   PROAIR HFA 108 (90 Base) MCG/ACT inhaler Inhale 1-2 puffs into the lungs every 6 (six) hours as needed for wheezing or shortness of breath.      rosuvastatin (CRESTOR) 10 MG tablet Take 10 mg by mouth daily.  3   tiZANidine (ZANAFLEX) 4 MG tablet TAKE 1 TABLET BY MOUTH AT BEDTIME AS NEEDED FOR MUSCLE SPASMS. 90 tablet 0   traMADol (ULTRAM) 50 MG tablet Take 1 tablet (50 mg total) by mouth 3 (three) times daily as needed. 90 tablet 5   No current facility-administered medications for this visit.    Allergies as of 06/07/2022 - Review Complete 06/07/2022  Allergen Reaction Noted   Bactrim [sulfamethoxazole-trimethoprim] Itching 04/02/2012   Penicillins Anaphylaxis 04/02/2012   Tetracyclines & related Swelling and Rash 04/24/2012   Ciprofloxacin  12/06/2021    Family History  Problem Relation Age of Onset   Cancer Mother        Colon or vaginal ?   Deep vein thrombosis Mother    Hypertension Father    Diabetes Father    Heart disease Father        Heart Disease before age 19   Diabetes Sister    Healthy Daughter    Healthy Daughter    Healthy Daughter    Diabetes Brother     Hypertension Brother    Healthy Son    Breast cancer Neg Hx     Social History   Socioeconomic History   Marital status: Married    Spouse name: Not on file   Number of children: Not on file   Years of education: Not on file   Highest education level: Not on file  Occupational History   Not on file  Tobacco Use   Smoking status: Former    Packs/day: 0.75    Years: 20.00  Additional pack years: 0.00    Total pack years: 15.00    Types: Cigarettes    Quit date: 01/03/2009    Years since quitting: 13.4    Passive exposure: Never   Smokeless tobacco: Never  Vaping Use   Vaping Use: Never used  Substance and Sexual Activity   Alcohol use: No   Drug use: No   Sexual activity: Not on file  Other Topics Concern   Not on file  Social History Narrative   Not on file   Social Determinants of Health   Financial Resource Strain: Not on file  Food Insecurity: Not on file  Transportation Needs: Not on file  Physical Activity: Not on file  Stress: Not on file  Social Connections: Not on file   Review of systems General: negative for malaise, night sweats, fever, chills, weight loss Neck: Negative for lumps, goiter, pain and significant neck swelling Resp: Negative for cough, wheezing, dyspnea at rest CV: Negative for chest pain, leg swelling, palpitations, orthopnea GI: denies melena, nausea, vomiting, constipation, dysphagia, odyonophagia, early satiety or unintentional weight loss. +abdominal pain +rectal bleeding +diarrhea +mucus in stools  MSK: Negative for joint pain or swelling, back pain, and muscle pain. Derm: Negative for itching or rash Psych: Denies depression, anxiety, memory loss, confusion. No homicidal or suicidal ideation.  Heme: Negative for prolonged bleeding, bruising easily, and swollen nodes. Endocrine: Negative for cold or heat intolerance, polyuria, polydipsia and goiter. Neuro: negative for tremor, gait imbalance, syncope and seizures. The remainder  of the review of systems is noncontributory.  Physical Exam: BP 129/80 (BP Location: Right Arm, Patient Position: Sitting, Cuff Size: Large)   Pulse 91   Temp 98.4 F (36.9 C) (Oral)   Ht 5\' 3"  (1.6 m)   Wt 187 lb 12.8 oz (85.2 kg)   BMI 33.27 kg/m  General:   Alert and oriented. No distress noted. Pleasant and cooperative.  Head:  Normocephalic and atraumatic. Eyes:  Conjuctiva clear without scleral icterus. Mouth:  Oral mucosa pink and moist. Good dentition. No lesions. Heart: Normal rate and rhythm, s1 and s2 heart sounds present.  Lungs: Clear lung sounds in all lobes. Respirations equal and unlabored. Abdomen:  +BS, soft, non-tender and non-distended. No rebound or guarding. No HSM or masses noted. Derm: No palmar erythema or jaundice Msk:  Symmetrical without gross deformities. Normal posture. Extremities:  Without edema. Neurologic:  Alert and  oriented x4 Psych:  Alert and cooperative. Normal mood and affect.  Invalid input(s): "6 MONTHS"   ASSESSMENT: NATISHA CASSADA is a 67 y.o. female presenting today for follow up of Crohn's disease  Crohn's disease: maintained on mesalamine, have frequent episodes of diarrhea, abdominal cramping, rectal bleeding and mucus in stools, last fecal calprotectin was 323 in January. Suspect Crohn's disease is not well controlled by mesalamine. Last TCS in 2019 with active disease. I discussed with the patient that we will need to proceed with colonoscopy to get a better idea of how her disease is doing and will likely need to consider transitioning her to a biologic therapy for her Crohn's disease which would involve an injectable/infusion type medication. Indications, risks and benefits of procedure discussed in detail with patient. Patient verbalized understanding and is in agreement to proceed with Colonoscopy. Further recommendations on change in therapy pending colonoscopy findings.    PLAN:  Schedule colonoscopy-ASA III  2. CBC, CMP  3.  Will need to consider biologic therapy treatment options (further recommendations to follow colonoscopy) 4. Continue  mesalamine for now   All questions were answered, patient verbalized understanding and is in agreement with plan as outlined above.    Follow Up: 3 months   Shauntae Reitman L. Jeanmarie Hubert, MSN, APRN, AGNP-C Adult-Gerontology Nurse Practitioner [facility]  I have reviewed the note and agree with the APP's assessment as described in this progress note  Depending on findings on colonoscopy, may need to pursue CT enterography.  Katrinka Blazing, MD Gastroenterology and Hepatology [facility] Gastroenterology

## 2022-06-08 ENCOUNTER — Telehealth (INDEPENDENT_AMBULATORY_CARE_PROVIDER_SITE_OTHER): Payer: Self-pay | Admitting: Gastroenterology

## 2022-06-08 ENCOUNTER — Telehealth (INDEPENDENT_AMBULATORY_CARE_PROVIDER_SITE_OTHER): Payer: Self-pay

## 2022-06-08 ENCOUNTER — Other Ambulatory Visit (INDEPENDENT_AMBULATORY_CARE_PROVIDER_SITE_OTHER): Payer: Self-pay

## 2022-06-08 ENCOUNTER — Encounter (INDEPENDENT_AMBULATORY_CARE_PROVIDER_SITE_OTHER): Payer: Self-pay

## 2022-06-08 DIAGNOSIS — R109 Unspecified abdominal pain: Secondary | ICD-10-CM

## 2022-06-08 DIAGNOSIS — R748 Abnormal levels of other serum enzymes: Secondary | ICD-10-CM

## 2022-06-08 LAB — COMPREHENSIVE METABOLIC PANEL
ALT: 20 IU/L (ref 0–32)
AST: 44 IU/L — ABNORMAL HIGH (ref 0–40)
Albumin/Globulin Ratio: 1.3 (ref 1.2–2.2)
Albumin: 4.8 g/dL (ref 3.9–4.9)
Alkaline Phosphatase: 90 IU/L (ref 44–121)
BUN/Creatinine Ratio: 19 (ref 12–28)
BUN: 16 mg/dL (ref 8–27)
Bilirubin Total: 0.4 mg/dL (ref 0.0–1.2)
CO2: 24 mmol/L (ref 20–29)
Calcium: 10.3 mg/dL (ref 8.7–10.3)
Chloride: 99 mmol/L (ref 96–106)
Creatinine, Ser: 0.86 mg/dL (ref 0.57–1.00)
Globulin, Total: 3.6 g/dL (ref 1.5–4.5)
Glucose: 147 mg/dL — ABNORMAL HIGH (ref 70–99)
Potassium: 5.2 mmol/L (ref 3.5–5.2)
Sodium: 137 mmol/L (ref 134–144)
Total Protein: 8.4 g/dL (ref 6.0–8.5)
eGFR: 74 mL/min/{1.73_m2} (ref >59)

## 2022-06-08 LAB — CBC/DIFF AMBIGUOUS DEFAULT
Basophils Absolute: 0.1 10*3/uL (ref 0.0–0.2)
Basos: 1 %
EOS (ABSOLUTE): 0.4 10*3/uL (ref 0.0–0.4)
Eos: 4 %
Hematocrit: 39.6 % (ref 34.0–46.6)
Hemoglobin: 12.9 g/dL (ref 11.1–15.9)
Immature Grans (Abs): 0.1 10*3/uL (ref 0.0–0.1)
Immature Granulocytes: 1 %
Lymphocytes Absolute: 1.7 10*3/uL (ref 0.7–3.1)
Lymphs: 19 %
MCH: 28.3 pg (ref 26.6–33.0)
MCHC: 32.6 g/dL (ref 31.5–35.7)
MCV: 87 fL (ref 79–97)
Monocytes Absolute: 0.8 10*3/uL (ref 0.1–0.9)
Monocytes: 8 %
Neutrophils Absolute: 6.2 10*3/uL (ref 1.4–7.0)
Neutrophils: 67 %
Platelets: 247 10*3/uL (ref 150–450)
RBC: 4.56 x10E6/uL (ref 3.77–5.28)
RDW: 12.7 % (ref 11.7–15.4)
WBC: 9.3 10*3/uL (ref 3.4–10.8)

## 2022-06-08 LAB — SPECIMEN STATUS REPORT

## 2022-06-08 NOTE — Telephone Encounter (Signed)
Pt in yesterday for office visit-needing TCS scheduled, ASA 3. Unable to schedule pt yesterday while in office.  Left message to return call.

## 2022-06-09 NOTE — Telephone Encounter (Signed)
Left message to return call 

## 2022-06-10 ENCOUNTER — Encounter (INDEPENDENT_AMBULATORY_CARE_PROVIDER_SITE_OTHER): Payer: Self-pay

## 2022-06-10 ENCOUNTER — Encounter: Payer: Medicare HMO | Attending: Registered Nurse | Admitting: Registered Nurse

## 2022-06-10 ENCOUNTER — Encounter: Payer: Self-pay | Admitting: Physician Assistant

## 2022-06-10 ENCOUNTER — Ambulatory Visit: Payer: Medicare HMO | Attending: Physician Assistant | Admitting: Physician Assistant

## 2022-06-10 ENCOUNTER — Encounter: Payer: Self-pay | Admitting: Registered Nurse

## 2022-06-10 VITALS — BP 125/60 | HR 96 | Ht 63.0 in | Wt 188.0 lb

## 2022-06-10 VITALS — BP 126/68 | HR 83 | Resp 16 | Ht 63.0 in | Wt 189.0 lb

## 2022-06-10 DIAGNOSIS — M170 Bilateral primary osteoarthritis of knee: Secondary | ICD-10-CM

## 2022-06-10 DIAGNOSIS — G8929 Other chronic pain: Secondary | ICD-10-CM | POA: Diagnosis not present

## 2022-06-10 DIAGNOSIS — Z8639 Personal history of other endocrine, nutritional and metabolic disease: Secondary | ICD-10-CM

## 2022-06-10 DIAGNOSIS — M25511 Pain in right shoulder: Secondary | ICD-10-CM | POA: Insufficient documentation

## 2022-06-10 DIAGNOSIS — M797 Fibromyalgia: Secondary | ICD-10-CM

## 2022-06-10 DIAGNOSIS — M25512 Pain in left shoulder: Secondary | ICD-10-CM | POA: Diagnosis not present

## 2022-06-10 DIAGNOSIS — M249 Joint derangement, unspecified: Secondary | ICD-10-CM | POA: Diagnosis not present

## 2022-06-10 DIAGNOSIS — Z5181 Encounter for therapeutic drug level monitoring: Secondary | ICD-10-CM | POA: Diagnosis not present

## 2022-06-10 DIAGNOSIS — G4709 Other insomnia: Secondary | ICD-10-CM

## 2022-06-10 DIAGNOSIS — M545 Low back pain, unspecified: Secondary | ICD-10-CM | POA: Insufficient documentation

## 2022-06-10 DIAGNOSIS — M5136 Other intervertebral disc degeneration, lumbar region: Secondary | ICD-10-CM | POA: Diagnosis not present

## 2022-06-10 DIAGNOSIS — G894 Chronic pain syndrome: Secondary | ICD-10-CM | POA: Insufficient documentation

## 2022-06-10 DIAGNOSIS — Z8709 Personal history of other diseases of the respiratory system: Secondary | ICD-10-CM

## 2022-06-10 DIAGNOSIS — M62838 Other muscle spasm: Secondary | ICD-10-CM | POA: Diagnosis not present

## 2022-06-10 DIAGNOSIS — Z8719 Personal history of other diseases of the digestive system: Secondary | ICD-10-CM | POA: Diagnosis not present

## 2022-06-10 DIAGNOSIS — R768 Other specified abnormal immunological findings in serum: Secondary | ICD-10-CM | POA: Diagnosis not present

## 2022-06-10 DIAGNOSIS — Z9049 Acquired absence of other specified parts of digestive tract: Secondary | ICD-10-CM

## 2022-06-10 DIAGNOSIS — M533 Sacrococcygeal disorders, not elsewhere classified: Secondary | ICD-10-CM | POA: Diagnosis not present

## 2022-06-10 DIAGNOSIS — Z79891 Long term (current) use of opiate analgesic: Secondary | ICD-10-CM | POA: Insufficient documentation

## 2022-06-10 DIAGNOSIS — M810 Age-related osteoporosis without current pathological fracture: Secondary | ICD-10-CM | POA: Diagnosis not present

## 2022-06-10 MED ORDER — LIDOCAINE HCL 1 % IJ SOLN
0.5000 mL | INTRAMUSCULAR | Status: AC | PRN
Start: 2022-06-10 — End: 2022-06-10
  Administered 2022-06-10: .5 mL

## 2022-06-10 MED ORDER — TRAMADOL HCL 50 MG PO TABS: 50.0000 mg | ORAL_TABLET | Freq: Three times a day (TID) | ORAL | 5 refills | Status: DC | PRN

## 2022-06-10 MED ORDER — TRIAMCINOLONE ACETONIDE 40 MG/ML IJ SUSP
10.0000 mg | INTRAMUSCULAR | Status: AC | PRN
Start: 2022-06-10 — End: 2022-06-10
  Administered 2022-06-10: 10 mg via INTRAMUSCULAR

## 2022-06-10 NOTE — Progress Notes (Signed)
Subjective:    Patient ID: Desiree Hogan, female    DOB: [birth year], 67 y.o.   MRN: [id number]  HPI: Desiree Hogan is a 67 y.o. female who returns for follow up appointment for chronic pain and medication refill. She states her pain is located in her Bilateral shoulder and lower back pain . She rates her pain 5. Her current exercise regime is walking and performing stretching exercises.  [name] Morphine equivalent is 30.00 MME.   UDS ordered today.     Pain Inventory Average Pain 5 Pain Right Now 5 My pain is intermittent and aching  In the last 24 hours, has pain interfered with the following? General activity 0 Relation with others 0 Enjoyment of life 0 What TIME of day is your pain at its worst? varies Sleep (in general) Fair  Pain is worse with: some activites Pain improves with: medication Relief from Meds: 5  Family History  Problem Relation Age of Onset   Cancer Mother        Colon or vaginal ?   Deep vein thrombosis Mother    Hypertension Father    Diabetes Father    Heart disease Father        Heart Disease before age 35   Diabetes Sister    Diabetes Brother    Hypertension Brother    Engineer, production Daughter    Healthy Daughter    Healthy Son    Breast cancer Neg Hx    Social History   Socioeconomic History   Marital status: Married    Spouse name: Not on file   Number of children: Not on file   Years of education: Not on file   Highest education level: Not on file  Occupational History   Not on file  Tobacco Use   Smoking status: Former    Packs/day: 0.75    Years: 20.00    Additional pack years: 0.00    Total pack years: 15.00    Types: Cigarettes    Quit date: 01/03/2009    Years since quitting: 13.4    Passive exposure: Never   Smokeless tobacco: Never  Vaping Use   Vaping Use: Never used  Substance and Sexual Activity   Alcohol use: No   Drug use: No   Sexual activity: Not on file  Other Topics Concern   Not on  file  Social History Narrative   Not on file   Social Determinants of Health   Financial Resource Strain: Not on file  Food Insecurity: Not on file  Transportation Needs: Not on file  Physical Activity: Not on file  Stress: Not on file  Social Connections: Not on file   Past Surgical History:  Procedure Laterality Date   BIOPSY  12/20/2017   Procedure: BIOPSY;  Surgeon: Desiree Hippo, MD;  [facility]: [facility];  Service: Endoscopy;;  cecal erosions   CESAREAN SECTION  06/24/82   CHOLECYSTECTOMY  1993   Gall Bladder   COLONOSCOPY  Jan. 31, 2014   COLONOSCOPY N/A 12/20/2017   Procedure: COLONOSCOPY;  Surgeon: Desiree Hippo, MD;  Location: [facility];  Service: Endoscopy;  Laterality: N/A;  2:25   SPINE SURGERY  2010 and 2011   TONSILLECTOMY     Past Surgical History:  Procedure Laterality Date   BIOPSY  12/20/2017   Procedure: BIOPSY;  Surgeon: Desiree Hippo, MD;  Location: [facility];  Service: Endoscopy;;  cecal erosions  CESAREAN SECTION  06/24/82   CHOLECYSTECTOMY  1993   Gall Bladder   COLONOSCOPY  Jan. 31, 2014   COLONOSCOPY N/A 12/20/2017   Procedure: COLONOSCOPY;  Surgeon: Desiree Hippo, MD;  Location: [facility];  Service: Endoscopy;  Laterality: N/A;  2:25   SPINE SURGERY  2010 and 2011   TONSILLECTOMY     Past Medical History:  Diagnosis Date   Abdominal pain    Anemia    Back pain    Colitis    COPD (chronic obstructive pulmonary disease) (HCC)    not on home o2   Crohn's disease (HCC) 03/29/12   Diabetes mellitus without complication (HCC)    Fibromyalgia    H/O breast biopsy    Hip pain    Hyperlipidemia    Hypothyroidism    Irritable bowel syndrome    Joint pain    Nonspecific abnormal finding in stool contents    Peripheral vascular disease (HCC)    Ulcer    Mouth   There were no vitals taken for this visit.  Opioid Risk Score:   Fall Risk Score:  `1  Depression screen [facility] 2/9     06/08/2021    2:19 PM 11/17/2020     1:41 PM 06/16/2020    1:38 PM 10/24/2019    1:34 PM 03/26/2019   12:41 PM  Depression screen PHQ 2/9  Decreased Interest 0 0 0 1 2  Down, Depressed, Hopeless 0 0 0 1 1  PHQ - 2 Score 0 0 0 2 3  Altered sleeping     2  Tired, decreased energy     2  Change in appetite     0  Feeling bad or failure about yourself      0  Trouble concentrating     2  Moving slowly or fidgety/restless     1  Suicidal thoughts     0  PHQ-9 Score     10     Review of Systems  Musculoskeletal:  Positive for back pain.       B/L shoulder pain       Objective:   Physical Exam Vitals and nursing note reviewed.  Constitutional:      Appearance: Normal appearance.  Cardiovascular:     Rate and Rhythm: Normal rate and regular rhythm.     Pulses: Normal pulses.     Heart sounds: Normal heart sounds. No murmur heard. Pulmonary:     Effort: Pulmonary effort is normal.     Breath sounds: Normal breath sounds.  Musculoskeletal:     Cervical back: Normal range of motion and neck supple.     Comments: Normal Muscle Bulk and Muscle Testing Reveals:  Upper Extremities: Full ROM and Muscle Strength 5/5 Bilateral AC Joint Tenderness Lower Extremities: Full ROM a nd Muscle Strength 5/5 Arises from Table with ease Narrow Base d Gait     Skin:    General: Skin is warm and dry.  Neurological:     Mental Status: She is alert and oriented to person, place, and time.  Psychiatric:        Mood and Affect: Mood normal.        Behavior: Behavior normal.         Assessment & Plan:  Bilateral Shoulder Pain: Continue HEP as Tolerated. Continue current medication regimen. Continue to Monitor. 06/10/2022 Lower Back Pain without Sciatica: Continue HEP as Tolerated. Continue current medication regimen. Continue to Monitor. 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 Continue Current Medication Regimen. Continue to Monitor. 06/10/2022 Fibromyalgia: Continue HEP as Tolerated. Continue  Lyrica.  Continue to Monitor. 06/10/2022 Chronic Pain Syndrome: Continue  Tramadol 50 mg three times a day as needed for pain #90. We will continue the opioid monitoring program, this consists of regular clinic visits, examinations, urine drug screen, pill counts as well as use of [place] Controlled Substance Reporting system. A 12 month History has been reviewed on the [place] Controlled Substance Reporting System on 16/07/2022.   F/U in 6 months

## 2022-06-15 LAB — TOXASSURE SELECT,+ANTIDEPR,UR

## 2022-06-22 ENCOUNTER — Other Ambulatory Visit: Payer: Self-pay | Admitting: Rheumatology

## 2022-06-22 ENCOUNTER — Other Ambulatory Visit: Payer: Self-pay | Admitting: Physician Assistant

## 2022-06-22 ENCOUNTER — Other Ambulatory Visit (INDEPENDENT_AMBULATORY_CARE_PROVIDER_SITE_OTHER): Payer: Self-pay | Admitting: Gastroenterology

## 2022-06-22 DIAGNOSIS — K50118 Crohn's disease of large intestine with other complication: Secondary | ICD-10-CM

## 2022-06-22 DIAGNOSIS — K5010 Crohn's disease of large intestine without complications: Secondary | ICD-10-CM

## 2022-06-22 NOTE — Telephone Encounter (Signed)
Last Fill: 04/18/2022  Next Visit: 12/15/2022  Last Visit: 06/10/2022  Dx: Fibromyalgia   Current Dose per office note on 06/10/2022: not discussed  Okay to refill Cymbalta?

## 2022-06-27 DIAGNOSIS — Z299 Encounter for prophylactic measures, unspecified: Secondary | ICD-10-CM | POA: Diagnosis not present

## 2022-06-27 DIAGNOSIS — E1165 Type 2 diabetes mellitus with hyperglycemia: Secondary | ICD-10-CM | POA: Diagnosis not present

## 2022-06-27 DIAGNOSIS — J329 Chronic sinusitis, unspecified: Secondary | ICD-10-CM | POA: Diagnosis not present

## 2022-06-27 DIAGNOSIS — I10 Essential (primary) hypertension: Secondary | ICD-10-CM | POA: Diagnosis not present

## 2022-06-27 NOTE — Telephone Encounter (Signed)
Pt returned call and pt informed that we need to scheduled TCS but no room for 3s at this time. Pt verbalized understanding

## 2022-07-01 ENCOUNTER — Telehealth (INDEPENDENT_AMBULATORY_CARE_PROVIDER_SITE_OTHER): Payer: Self-pay | Admitting: *Deleted

## 2022-07-01 NOTE — Telephone Encounter (Signed)
Pt in reminder file  HFP due 07/08/22 per chelsea. Pt would like a call and mail paperwork. - [facility].  Labs were mailed on 6/20 and I called her today and left her a message to notify her labs were mailed to her and do around 07/08/22.

## 2022-07-08 DIAGNOSIS — R748 Abnormal levels of other serum enzymes: Secondary | ICD-10-CM | POA: Diagnosis not present

## 2022-07-08 DIAGNOSIS — R109 Unspecified abdominal pain: Secondary | ICD-10-CM | POA: Diagnosis not present

## 2022-07-09 LAB — HEPATIC FUNCTION PANEL
ALT: 12 IU/L (ref 0–32)
AST: 26 IU/L (ref 0–40)
Albumin: 4.5 g/dL (ref 3.9–4.9)
Alkaline Phosphatase: 85 IU/L (ref 44–121)
Bilirubin Total: 0.4 mg/dL (ref 0.0–1.2)
Bilirubin, Direct: 0.14 mg/dL (ref 0.00–0.40)
Total Protein: 7.8 g/dL (ref 6.0–8.5)

## 2022-07-12 ENCOUNTER — Encounter (INDEPENDENT_AMBULATORY_CARE_PROVIDER_SITE_OTHER): Payer: Self-pay

## 2022-08-25 ENCOUNTER — Other Ambulatory Visit (INDEPENDENT_AMBULATORY_CARE_PROVIDER_SITE_OTHER): Payer: Self-pay | Admitting: Gastroenterology

## 2022-08-25 ENCOUNTER — Other Ambulatory Visit: Payer: Self-pay | Admitting: Physician Assistant

## 2022-08-25 DIAGNOSIS — K5010 Crohn's disease of large intestine without complications: Secondary | ICD-10-CM

## 2022-08-25 DIAGNOSIS — K50118 Crohn's disease of large intestine with other complication: Secondary | ICD-10-CM

## 2022-08-25 NOTE — Telephone Encounter (Signed)
 Last seen 06/07/22

## 2022-08-25 NOTE — Telephone Encounter (Signed)
Last Fill: 06/03/2022 (Tizanidine), 06/22/2022 (Cymbalta)  Next Visit: 12/15/2022  Last Visit: 06/10/2022  Dx:  Fibromyalgia   Current Dose per office note on 06/10/2022:  Cymbalta not discussed tizanidine 4 mg at bedtime as needed for muscle spasms   Okay to refill Cymbalta and Tizanidine?

## 2022-10-03 ENCOUNTER — Ambulatory Visit (INDEPENDENT_AMBULATORY_CARE_PROVIDER_SITE_OTHER): Payer: Medicare HMO | Admitting: Gastroenterology

## 2022-10-06 ENCOUNTER — Other Ambulatory Visit (INDEPENDENT_AMBULATORY_CARE_PROVIDER_SITE_OTHER): Payer: Self-pay | Admitting: Gastroenterology

## 2022-10-06 ENCOUNTER — Other Ambulatory Visit: Payer: Self-pay | Admitting: Physician Assistant

## 2022-10-06 DIAGNOSIS — K50118 Crohn's disease of large intestine with other complication: Secondary | ICD-10-CM

## 2022-10-06 DIAGNOSIS — K5010 Crohn's disease of large intestine without complications: Secondary | ICD-10-CM

## 2022-10-06 NOTE — Telephone Encounter (Signed)
Last seen June 2024

## 2022-10-06 NOTE — Telephone Encounter (Signed)
Left message to return call 

## 2022-10-11 NOTE — Telephone Encounter (Signed)
I called and left a message asked that the patient please return call.  

## 2022-10-18 ENCOUNTER — Telehealth (INDEPENDENT_AMBULATORY_CARE_PROVIDER_SITE_OTHER): Payer: Self-pay | Admitting: Gastroenterology

## 2022-10-18 NOTE — Telephone Encounter (Signed)
Kenney Houseman can we try to schedule colonoscopy for the patient, asa III, diagnosis is crohn's disease

## 2022-10-18 NOTE — Telephone Encounter (Signed)
Spoke with patient and she does want to go ahead and schedule colonoscopy and wanting refill of mesalamine.

## 2022-10-18 NOTE — Telephone Encounter (Signed)
Last seen 06/07/22

## 2022-10-25 ENCOUNTER — Other Ambulatory Visit: Payer: Self-pay | Admitting: Physician Assistant

## 2022-10-25 MED ORDER — PEG 3350-KCL-NA BICARB-NACL 420 G PO SOLR: 4000.0000 mL | Freq: Once | ORAL | 0 refills | Status: AC

## 2022-10-25 NOTE — Telephone Encounter (Signed)
Would you like TCS with Levon Hedger or Ahmed? Please advise. Thank you

## 2022-10-25 NOTE — Telephone Encounter (Signed)
Pt contacted and schedule for 11/11/22 with Dr.Castaneda. No pa needed. Instructions will be mailed once pre op is received. Prep sent to pharmacy.

## 2022-10-25 NOTE — Addendum Note (Signed)
Addended by: Marlowe Shores on: 10/25/2022 01:47 PM   Modules accepted: Orders

## 2022-11-07 NOTE — Patient Instructions (Signed)
Desiree Hogan  11/07/2022     @PREFPERIOPPHARMACY @   Your procedure is scheduled on  11/11/2022.   Report to [facility] at  0745  A.M.   Call this number if you have problems the morning of surgery:  [phone]  If you experience any cold or flu symptoms such as cough, fever, chills, shortness of breath, etc. between now and your scheduled surgery, please notify us at the above number.   Remember:     Use your inhalers before you come and bring your rescue inhaler with you.      DO NOT take any medications for diabetes the morning of your procedure.    Follow the diet and prep instructions given to you by the office.    You may drink clear liquids until 0545 am on 11/11/2022.    Clear liquids allowed are:                    Water, Carbonated beverages (diabetics please choose diet or no sugar options), Black Coffee Only (No creamer, milk or cream, including half & half and powdered creamer), and Clear Sports drink (No red color; diabetics please choose diet or no sugar options)     Take these medicines the morning of surgery with A SIP OF WATER         duloxetine, levothyroxine, mesalamine, zofran (if needed), pregabalin, tramadol(if needed).     Do not wear jewelry, make-up or nail polish, including gel polish,  artificial nails, or any other type of covering on natural nails (fingers and  toes).  Do not wear lotions, powders, or perfumes, or deodorant.  Do not shave 48 hours prior to surgery.  Men may shave face and neck.  Do not bring valuables to the hospital.  [facility] is not responsible for any belongings or valuables.  Contacts, dentures or bridgework may not be worn into surgery.  Leave your suitcase in the car.  After surgery it may be brought to your room.  For patients admitted to the hospital, discharge time will be determined by your treatment team.  Patients discharged the day of surgery will not be allowed to drive home and must have  someone with them for 24 hours.    Special instructions:   DO NOT smoke tobacco or vape for 24 hors before your procedure.  Please read over the following fact sheets that you were given. Anesthesia Post-op Instructions and Care and Recovery After Surgery      Colonoscopy, Adult, Care After The following information offers guidance on how to care for yourself after your procedure. Your health care provider may also give you more specific instructions. If you have problems or questions, contact your health care provider. What can I expect after the procedure? After the procedure, it is common to have: A small amount of blood in your stool for 24 hours after the procedure. Some gas. Mild cramping or bloating of your abdomen. Follow these instructions at home: Eating and drinking  Drink enough fluid to keep your urine pale yellow. Follow instructions from your health care provider about eating or drinking restrictions. Resume your normal diet as told by your health care provider. Avoid heavy or fried foods that are hard to digest. Activity Rest as told by your health care provider. Avoid sitting for a long time without moving. Get up to take short walks every 1-2 hours. This is important to improve blood flow and breathing.  Ask for help if you feel weak or unsteady. Return to your normal activities as told by your health care provider. Ask your health care provider what activities are safe for you. Managing cramping and bloating  Try walking around when you have cramps or feel bloated. If directed, apply heat to your abdomen as told by your health care provider. Use the heat source that your health care provider recommends, such as a moist heat pack or a heating pad. Place a towel between your skin and the heat source. Leave the heat on for 20-30 minutes. Remove the heat if your skin turns bright red. This is especially important if you are unable to feel pain, heat, or cold. You have a  greater risk of getting burned. General instructions If you were given a sedative during the procedure, it can affect you for several hours. Do not drive or operate machinery until your health care provider says that it is safe. For the first 24 hours after the procedure: Do not sign important documents. Do not drink alcohol. Do your regular daily activities at a slower pace than normal. Eat soft foods that are easy to digest. Take over-the-counter and prescription medicines only as told by your health care provider. Keep all follow-up visits. This is important. Contact a health care provider if: You have blood in your stool 2-3 days after the procedure. Get help right away if: You have more than a small spotting of blood in your stool. You have large blood clots in your stool. You have swelling of your abdomen. You have nausea or vomiting. You have a fever. You have increasing pain in your abdomen that is not relieved with medicine. These symptoms may be an emergency. Get help right away. Call 911. Do not wait to see if the symptoms will go away. Do not drive yourself to the hospital. Summary After the procedure, it is common to have a small amount of blood in your stool. You may also have mild cramping and bloating of your abdomen. If you were given a sedative during the procedure, it can affect you for several hours. Do not drive or operate machinery until your health care provider says that it is safe. Get help right away if you have a lot of blood in your stool, nausea or vomiting, a fever, or increased pain in your abdomen. This information is not intended to replace advice given to you by your health care provider. Make sure you discuss any questions you have with your health care provider. Document Revised: 02/01/2022 Document Reviewed: 08/12/2020 Elsevier Patient Education  2024 [employer] Monitored Anesthesia Care, Care After The following information offers guidance on  how to care for yourself after your procedure. Your health care provider may also give you more specific instructions. If you have problems or questions, contact your health care provider. What can I expect after the procedure? After the procedure, it is common to have: Tiredness. Little or no memory about what happened during or after the procedure. Impaired judgment when it comes to making decisions. Nausea or vomiting. Some trouble with balance. Follow these instructions at home: For the time period you were told by your health care provider:  Rest. Do not participate in activities where you could fall or become injured. Do not drive or use machinery. Do not drink alcohol. Do not take sleeping pills or medicines that cause drowsiness. Do not make important decisions or sign legal documents. Do not take care of children on your  own. Medicines Take over-the-counter and prescription medicines only as told by your health care provider. If you were prescribed antibiotics, take them as told by your health care provider. Do not stop using the antibiotic even if you start to feel better. Eating and drinking Follow instructions from your health care provider about what you may eat and drink. Drink enough fluid to keep your urine pale yellow. If you vomit: Drink clear fluids slowly and in small amounts as you are able. Clear fluids include water, ice chips, low-calorie sports drinks, and fruit juice that has water added to it (diluted fruit juice). Eat light and bland foods in small amounts as you are able. These foods include bananas, applesauce, rice, lean meats, toast, and crackers. General instructions  Have a responsible adult stay with you for the time you are told. It is important to have someone help care for you until you are awake and alert. If you have sleep apnea, surgery and some medicines can increase your risk for breathing problems. Follow instructions from your health care  provider about wearing your sleep device: When you are sleeping. This includes during daytime naps. While taking prescription pain medicines, sleeping medicines, or medicines that make you drowsy. Do not use any products that contain nicotine or tobacco. These products include cigarettes, chewing tobacco, and vaping devices, such as e-cigarettes. If you need help quitting, ask your health care provider. Contact a health care provider if: You feel nauseous or vomit every time you eat or drink. You feel light-headed. You are still sleepy or having trouble with balance after 24 hours. You get a rash. You have a fever. You have redness or swelling around the IV site. Get help right away if: You have trouble breathing. You have new confusion after you get home. These symptoms may be an emergency. Get help right away. Call 911. Do not wait to see if the symptoms will go away. Do not drive yourself to the hospital. This information is not intended to replace advice given to you by your health care provider. Make sure you discuss any questions you have with your health care provider. Document Revised: 05/17/2021 Document Reviewed: 05/17/2021 Elsevier Patient Education  2024 [employer].

## 2022-11-08 ENCOUNTER — Encounter (HOSPITAL_COMMUNITY)
Admission: RE | Admit: 2022-11-08 | Discharge: 2022-11-08 | Disposition: A | Discharge status: Home / Self Care | Payer: Medicare HMO | Source: Ambulatory Visit | Attending: Gastroenterology | Admitting: Gastroenterology

## 2022-11-08 ENCOUNTER — Encounter (HOSPITAL_COMMUNITY): Payer: Self-pay

## 2022-11-08 VITALS — BP 133/57 | HR 87 | Temp 97.8°F | Resp 18 | Ht 63.0 in | Wt 188.1 lb

## 2022-11-08 DIAGNOSIS — Z01818 Encounter for other preprocedural examination: Secondary | ICD-10-CM | POA: Diagnosis not present

## 2022-11-08 DIAGNOSIS — E119 Type 2 diabetes mellitus without complications: Secondary | ICD-10-CM | POA: Diagnosis not present

## 2022-11-08 DIAGNOSIS — D649 Anemia, unspecified: Secondary | ICD-10-CM | POA: Diagnosis not present

## 2022-11-08 LAB — BASIC METABOLIC PANEL
Anion gap: 10 (ref 5–15)
BUN: 17 mg/dL (ref 8–23)
CO2: 23 mmol/L (ref 22–32)
Calcium: 8.9 mg/dL (ref 8.9–10.3)
Chloride: 101 mmol/L (ref 98–111)
Creatinine, Ser: 0.79 mg/dL (ref 0.44–1.00)
GFR, Estimated: >60 mL/min (ref >60)
Glucose, Bld: 144 mg/dL — ABNORMAL HIGH (ref 70–99)
Potassium: 3.5 mmol/L (ref 3.5–5.1)
Sodium: 134 mmol/L — ABNORMAL LOW (ref 135–145)

## 2022-11-08 LAB — CBC WITH DIFFERENTIAL/PLATELET
Abs Immature Granulocytes: 0.03 10*3/uL (ref 0.00–0.07)
Basophils Absolute: 0.1 10*3/uL (ref 0.0–0.1)
Basophils Relative: 1 %
Eosinophils Absolute: 0.2 10*3/uL (ref 0.0–0.5)
Eosinophils Relative: 4 %
HCT: 38.3 % (ref 36.0–46.0)
Hemoglobin: 11.9 g/dL — ABNORMAL LOW (ref 12.0–15.0)
Immature Granulocytes: 1 %
Lymphocytes Relative: 28 %
Lymphs Abs: 1.9 10*3/uL (ref 0.7–4.0)
MCH: 28.1 pg (ref 26.0–34.0)
MCHC: 31.1 g/dL (ref 30.0–36.0)
MCV: 90.5 fL (ref 80.0–100.0)
Monocytes Absolute: 0.6 10*3/uL (ref 0.1–1.0)
Monocytes Relative: 9 %
Neutro Abs: 3.8 10*3/uL (ref 1.7–7.7)
Neutrophils Relative %: 57 %
Platelets: 204 10*3/uL (ref 150–400)
RBC: 4.23 MIL/uL (ref 3.87–5.11)
RDW: 12.7 % (ref 11.5–15.5)
WBC: 6.6 10*3/uL (ref 4.0–10.5)
nRBC: 0 % (ref 0.0–0.2)

## 2022-11-11 ENCOUNTER — Ambulatory Visit (HOSPITAL_COMMUNITY)
Admission: RE | Admit: 2022-11-11 | Discharge: 2022-11-11 | Disposition: A | Discharge status: Home / Self Care | Payer: Medicare HMO | Attending: Gastroenterology | Admitting: Gastroenterology

## 2022-11-11 ENCOUNTER — Ambulatory Visit (HOSPITAL_COMMUNITY): Payer: Medicare HMO | Admitting: Certified Registered"

## 2022-11-11 ENCOUNTER — Encounter (HOSPITAL_COMMUNITY)
Admission: RE | Disposition: A | Discharge status: Home / Self Care | Payer: Self-pay | Source: Home / Self Care | Attending: Gastroenterology

## 2022-11-11 ENCOUNTER — Telehealth (INDEPENDENT_AMBULATORY_CARE_PROVIDER_SITE_OTHER): Payer: Self-pay | Admitting: Gastroenterology

## 2022-11-11 ENCOUNTER — Other Ambulatory Visit: Payer: Self-pay

## 2022-11-11 ENCOUNTER — Encounter (HOSPITAL_COMMUNITY): Payer: Self-pay | Admitting: Gastroenterology

## 2022-11-11 DIAGNOSIS — E1151 Type 2 diabetes mellitus with diabetic peripheral angiopathy without gangrene: Secondary | ICD-10-CM | POA: Diagnosis not present

## 2022-11-11 DIAGNOSIS — K5900 Constipation, unspecified: Secondary | ICD-10-CM

## 2022-11-11 DIAGNOSIS — D122 Benign neoplasm of ascending colon: Secondary | ICD-10-CM | POA: Diagnosis not present

## 2022-11-11 DIAGNOSIS — K529 Noninfective gastroenteritis and colitis, unspecified: Secondary | ICD-10-CM

## 2022-11-11 DIAGNOSIS — K633 Ulcer of intestine: Secondary | ICD-10-CM | POA: Diagnosis not present

## 2022-11-11 DIAGNOSIS — E785 Hyperlipidemia, unspecified: Secondary | ICD-10-CM | POA: Diagnosis not present

## 2022-11-11 DIAGNOSIS — K635 Polyp of colon: Secondary | ICD-10-CM | POA: Diagnosis not present

## 2022-11-11 DIAGNOSIS — K5730 Diverticulosis of large intestine without perforation or abscess without bleeding: Secondary | ICD-10-CM | POA: Diagnosis not present

## 2022-11-11 DIAGNOSIS — K5010 Crohn's disease of large intestine without complications: Secondary | ICD-10-CM | POA: Diagnosis not present

## 2022-11-11 DIAGNOSIS — J449 Chronic obstructive pulmonary disease, unspecified: Secondary | ICD-10-CM | POA: Insufficient documentation

## 2022-11-11 DIAGNOSIS — D126 Benign neoplasm of colon, unspecified: Secondary | ICD-10-CM | POA: Diagnosis not present

## 2022-11-11 DIAGNOSIS — Z1211 Encounter for screening for malignant neoplasm of colon: Secondary | ICD-10-CM

## 2022-11-11 DIAGNOSIS — Z87891 Personal history of nicotine dependence: Secondary | ICD-10-CM | POA: Diagnosis not present

## 2022-11-11 DIAGNOSIS — E039 Hypothyroidism, unspecified: Secondary | ICD-10-CM | POA: Diagnosis not present

## 2022-11-11 DIAGNOSIS — M797 Fibromyalgia: Secondary | ICD-10-CM | POA: Diagnosis not present

## 2022-11-11 DIAGNOSIS — K6389 Other specified diseases of intestine: Secondary | ICD-10-CM | POA: Diagnosis not present

## 2022-11-11 HISTORY — PX: POLYPECTOMY: SHX5525

## 2022-11-11 HISTORY — PX: COLONOSCOPY WITH PROPOFOL: SHX5780

## 2022-11-11 HISTORY — PX: BIOPSY: SHX5522

## 2022-11-11 LAB — HM COLONOSCOPY

## 2022-11-11 LAB — GLUCOSE, CAPILLARY: Glucose-Capillary: 115 mg/dL — ABNORMAL HIGH (ref 70–99)

## 2022-11-11 SURGERY — COLONOSCOPY WITH PROPOFOL
Anesthesia: General

## 2022-11-11 MED ORDER — LACTATED RINGERS IV SOLN: INTRAVENOUS | Status: DC | PRN

## 2022-11-11 MED ORDER — LIDOCAINE HCL (CARDIAC) PF 100 MG/5ML IV SOSY
PREFILLED_SYRINGE | INTRAVENOUS | Status: DC | PRN
  Administered 2022-11-11: 50 mg via INTRAVENOUS

## 2022-11-11 MED ORDER — PROPOFOL 10 MG/ML IV BOLUS
INTRAVENOUS | Status: DC | PRN
  Administered 2022-11-11: 30 mg via INTRAVENOUS
  Administered 2022-11-11: 50 mg via INTRAVENOUS
  Administered 2022-11-11: 125 ug/kg/min via INTRAVENOUS

## 2022-11-11 MED ORDER — SODIUM CHLORIDE 0.9% FLUSH: 10.0000 mL | Freq: Two times a day (BID) | INTRAVENOUS | Status: DC

## 2022-11-11 NOTE — Anesthesia Postprocedure Evaluation (Signed)
Anesthesia Post Note  Patient: Desiree Hogan  Procedure(s) Performed: COLONOSCOPY WITH PROPOFOL BIOPSY POLYPECTOMY  Patient location during evaluation: Phase II Anesthesia Type: General Level of consciousness: awake Pain management: pain level controlled Vital Signs Assessment: post-procedure vital signs reviewed and stable Respiratory status: spontaneous breathing and respiratory function stable Cardiovascular status: blood pressure returned to baseline and stable Postop Assessment: no headache and no apparent nausea or vomiting Anesthetic complications: no Comments: Late entry   No notable events documented.   Last Vitals:  Vitals:   11/11/22 0800 11/11/22 0948  BP: (!) 149/69 (!) 103/48  Pulse: 77 81  Resp: 18 17  Temp: 36.6 C 36.4 C  SpO2: 95% 100%    Last Pain:  Vitals:   11/11/22 0948  TempSrc: Oral  PainSc: 0-No pain                 Windell Norfolk

## 2022-11-11 NOTE — Discharge Instructions (Signed)
You are being discharged to home.  Resume your previous diet.  We are waiting for your pathology results.  Your physician has recommended a repeat colonoscopy for surveillance based on your clinical status - will discuss timing in the office. Will discuss Entyvio vs Skyrizi for management of CD in the office. Follow up in 2-3 weeks in GI office.

## 2022-11-11 NOTE — Telephone Encounter (Signed)
Hi Mitzie,  Can you please schedule a follow up appointment for this patient in 2-3 weeks with me or Chelsea?  Thanks,  Katrinka Blazing, MD Gastroenterology and Hepatology [facility]

## 2022-11-11 NOTE — Anesthesia Preprocedure Evaluation (Signed)
Anesthesia Evaluation  Patient identified by MRN, date of birth, ID band Patient awake    Reviewed: Allergy & Precautions, H&P , NPO status , Patient's Chart, lab work & pertinent test results, reviewed documented beta blocker date and time   Airway Mallampati: II  TM Distance: >3 FB Neck ROM: full    Dental no notable dental hx.    Pulmonary neg pulmonary ROS, pneumonia, COPD, former smoker   Pulmonary exam normal breath sounds clear to auscultation       Cardiovascular Exercise Tolerance: Good + Peripheral Vascular Disease  negative cardio ROS  Rhythm:regular Rate:Normal     Neuro/Psych  Neuromuscular disease negative neurological ROS  negative psych ROS   GI/Hepatic negative GI ROS, Neg liver ROS,,,  Endo/Other  negative endocrine ROSdiabetesHypothyroidism    Renal/GU negative Renal ROS  negative genitourinary   Musculoskeletal   Abdominal   Peds  Hematology negative hematology ROS (+) Blood dyscrasia, anemia   Anesthesia Other Findings   Reproductive/Obstetrics negative OB ROS                             Anesthesia Physical Anesthesia Plan  ASA: 3  Anesthesia Plan: General   Post-op Pain Management:    Induction:   PONV Risk Score and Plan: Propofol infusion  Airway Management Planned:   Additional Equipment:   Intra-op Plan:   Post-operative Plan:   Informed Consent: I have reviewed the patients History and Physical, chart, labs and discussed the procedure including the risks, benefits and alternatives for the proposed anesthesia with the patient or authorized representative who has indicated his/her understanding and acceptance.     Dental Advisory Given  Plan Discussed with: CRNA  Anesthesia Plan Comments:        Anesthesia Quick Evaluation

## 2022-11-11 NOTE — Transfer of Care (Addendum)
Immediate Anesthesia Transfer of Care Note  Patient: Desiree Hogan  Procedure(s) Performed: COLONOSCOPY WITH PROPOFOL BIOPSY POLYPECTOMY  Patient Location: PACU  Anesthesia Type:General  Level of Consciousness: awake, alert , oriented, and patient cooperative  Airway & Oxygen Therapy: Patient Spontanous Breathing and Patient connected to nasal cannula oxygen  Post-op Assessment: Report given to RN, Post -op Vital signs reviewed and stable, and Patient moving all extremities X 4  Post vital signs: Reviewed and stable  Last Vitals: VSS, RN to enter. Vitals Value Taken Time  BP 103/48   Temp 36.4   Pulse 81   Resp 17   SpO2 100%     Last Pain:  Vitals:   11/11/22 0859  TempSrc:   PainSc: 4       Patients Stated Pain Goal: 8 (11/11/22 0800)  Complications: No notable events documented.

## 2022-11-11 NOTE — H&P (Signed)
Desiree Hogan is an 67 y.o. female.   Chief Complaint:  HPI: 67 year old female with pmh of Crohn's disease, anemia, COPD, DM, fibromyalgia, HLD, Hypothyroidism, IBS, PVD coming for evaluation of Crohn's disease.  Patient reports having diarrhea 5-6 times epr day, along with abdominal pain. Not on Cd treatment  Past Medical History:  Diagnosis Date   Abdominal pain    Anemia    Back pain    Colitis    COPD (chronic obstructive pulmonary disease) (HCC)    not on home o2   Crohn's disease (HCC) 03/29/12   Diabetes mellitus without complication (HCC)    Fibromyalgia    H/O breast biopsy    Hip pain    Hyperlipidemia    Hypothyroidism    Irritable bowel syndrome    Joint pain    Nonspecific abnormal finding in stool contents    Peripheral vascular disease (HCC)    Ulcer    Mouth    Past Surgical History:  Procedure Laterality Date   BIOPSY  12/20/2017   Procedure: BIOPSY;  Surgeon: Malissa Hippo, MD;  Location: [facility];  Service: Endoscopy;;  cecal erosions   CESAREAN SECTION  06/24/82   CHOLECYSTECTOMY  1993   Gall Bladder   COLONOSCOPY  Jan. 31, 2014   COLONOSCOPY N/A 12/20/2017   Procedure: COLONOSCOPY;  Surgeon: Malissa Hippo, MD;  Location: [facility];  Service: Endoscopy;  Laterality: N/A;  2:25   SPINE SURGERY  2010 and 2011   TONSILLECTOMY      Family History  Problem Relation Age of Onset   Cancer Mother        Colon or vaginal ?   Deep vein thrombosis Mother    Hypertension Father    Diabetes Father    Heart disease Father        Heart Disease before age 81   Diabetes Sister    Diabetes Brother    Hypertension Brother    Healthy Daughter    Healthy Daughter    Healthy Daughter    Healthy Son    Breast cancer Neg Hx    Social History:  reports that she quit smoking about 13 years ago. Her smoking use included cigarettes. She started smoking about 33 years ago. She has a 15 pack-year smoking history. She has never been exposed to tobacco  smoke. She has never used smokeless tobacco. She reports that she does not drink alcohol and does not use drugs.  Allergies:  Allergies  Allergen Reactions   Bactrim [Sulfamethoxazole-Trimethoprim] Itching    And sores   Penicillins Anaphylaxis    Has patient had a PCN reaction causing immediate rash, facial/tongue/throat swelling, SOB or lightheadedness with hypotension: Yes Has patient had a PCN reaction causing severe rash involving mucus membranes or skin necrosis: Yes Has patient had a PCN reaction that required hospitalization: No Has patient had a PCN reaction occurring within the last 10 years: No If all of the above answers are "NO", then may proceed with Cephalosporin use.    Tetracyclines & Related Swelling and Rash   Ciprofloxacin     Blisters and facial swelling    Medications Prior to Admission  Medication Sig Dispense Refill   alendronate (FOSAMAX) 70 MG tablet Take 70 mg by mouth every Friday. Take with a full glass of water on an empty stomach.     CALCIUM GLUCONATE PO Take 600 mg by mouth 2 (two) times daily.      DULoxetine (  CYMBALTA) 30 MG capsule TAKE 1 CAPSULE BY MOUTH EVERY DAY 90 capsule 0   levothyroxine (SYNTHROID, LEVOTHROID) 125 MCG tablet Take 125 mcg by mouth daily.  3   lisinopril (ZESTRIL) 2.5 MG tablet Take 2.5 mg by mouth daily.     pregabalin (LYRICA) 50 MG capsule TAKE 1 CAPSULE BY MOUTH 2 TIMES DAILY. 60 capsule 5   traMADol (ULTRAM) 50 MG tablet Take 1 tablet (50 mg total) by mouth 3 (three) times daily as needed. 90 tablet 5   BREO ELLIPTA 100-25 MCG/INH AEPB Inhale 1 puff into the lungs daily.      cetirizine (ZYRTEC) 10 MG tablet Take 10 mg by mouth daily.     dicyclomine (BENTYL) 10 MG capsule TAKE 1 CAPSULE (10 MG TOTAL) BY MOUTH 3 (THREE) TIMES DAILY AS NEEDED FOR SPASMS. 270 capsule 1   gemfibrozil (LOPID) 600 MG tablet Take 600 mg by mouth 2 (two) times daily.     Mesalamine (ASACOL) 400 MG CPDR DR capsule TAKE 2 CAPSULES (800 MG TOTAL) BY  MOUTH 2 (TWO) TIMES DAILY. 360 capsule 0   ondansetron (ZOFRAN) 4 MG tablet TAKE 1 TABLET (4 MG TOTAL) BY MOUTH 2 (TWO) TIMES DAILY AS NEEDED FOR NAUSEA OR VOMITING. 30 tablet 1   PROAIR HFA 108 (90 Base) MCG/ACT inhaler Inhale 1-2 puffs into the lungs every 6 (six) hours as needed for wheezing or shortness of breath.      rosuvastatin (CRESTOR) 10 MG tablet Take 10 mg by mouth daily.  3   tiZANidine (ZANAFLEX) 4 MG tablet TAKE 1 TABLET BY MOUTH AT BEDTIME AS NEEDED FOR MUSCLE SPASMS. 90 tablet 0    Results for orders placed or performed during the hospital encounter of 11/11/22 (from the past 48 hour(s))  Glucose, capillary     Status: Abnormal   Collection Time: 11/11/22  7:56 AM  Result Value Ref Range   Glucose-Capillary 115 (H) 70 - 99 mg/dL    Comment: Glucose reference range applies only to samples taken after fasting for at least 8 hours.   No results found.  Review of Systems  Gastrointestinal:  Positive for abdominal pain and diarrhea.  All other systems reviewed and are negative.   Blood pressure (!) 149/69, pulse 77, temperature 97.9 F (36.6 C), temperature source Oral, resp. rate 18, height 5\' 3"  (1.6 m), weight 83.3 kg, SpO2 95%. Physical Exam  GENERAL: The patient is AO x3, in no acute distress. HEENT: Head is normocephalic and atraumatic. EOMI are intact. Mouth is well hydrated and without lesions. NECK: Supple. No masses LUNGS: Clear to auscultation. No presence of rhonchi/wheezing/rales. Adequate chest expansion HEART: RRR, normal s1 and s2. ABDOMEN: Soft, nontender, no guarding, no peritoneal signs, and nondistended. BS +. No masses. EXTREMITIES: Without any cyanosis, clubbing, rash, lesions or edema. NEUROLOGIC: AOx3, no focal motor deficit. SKIN: no jaundice, no rashes  Assessment/Plan 67 year old female with pmh of Crohn's disease, anemia, COPD, DM, fibromyalgia, HLD, Hypothyroidism, IBS, PVD coming for evaluation of Crohn's disease.  Will proceed with  colonoscopy.  Dolores Frame, MD 11/11/2022, 9:02 AM

## 2022-11-11 NOTE — Op Note (Addendum)
[facility] Patient Name: Desiree Hogan Procedure Date: 11/11/2022 8:45 AM MRN: [id number] Date of Birth: 03-21-1955 Attending MD: Katrinka Blazing , , [id number] CSN: [id number] Age: 67 Admit Type: Outpatient Procedure:                Colonoscopy Indications:              Chronic diarrhea, Follow-up of Crohn's disease of                            the colon Providers:                Katrinka Blazing, Francoise Ceo RN, RN, Dyann Ruddle Referring MD:              Medicines:                Monitored Anesthesia Care Complications:            No immediate complications. Estimated Blood Loss:     Estimated blood loss: none. Procedure:                Pre-Anesthesia Assessment:                           - Prior to the procedure, a History and Physical                            was performed, and patient medications, allergies                            and sensitivities were reviewed. The patient's                            tolerance of previous anesthesia was reviewed.                           - The risks and benefits of the procedure and the                            sedation options and risks were discussed with the                            patient. All questions were answered and informed                            consent was obtained.                           - ASA Grade Assessment: III - A patient with severe                            systemic disease.                           After obtaining informed consent, the colonoscope                            was passed under direct vision. Throughout  the                            procedure, the patient's blood pressure, pulse, and                            oxygen saturations were monitored continuously. The                            [id number]-HQ190L ([id number]) scope was introduced through                            the anus and advanced to the 10 cm into the ileum.                            The colonoscopy was performed without  difficulty.                            The patient tolerated the procedure well. The                            quality of the bowel preparation was adequate to                            identify polyps greater than 5 mm in size. Scope In: 9:07:21 AM Scope Out: 9:42:23 AM Scope Withdrawal Time: 0 hours 23 minutes 39 seconds  Total Procedure Duration: 0 hours 35 minutes 2 seconds  Findings:      The perianal and digital rectal examinations were normal.      The terminal ileum contained a few non-bleeding erosions. Biopsies were       taken with a cold forceps for histology.      A few non-bleeding erosions were found in the ascending colon. Biopsies       were taken with a cold forceps for histology.      A 4 mm polyp was found in the ascending colon. The polyp was sessile.       The polyp was removed with a cold snare. Resection and retrieval were       complete.      A 1 mm polyp was found in the ascending colon. The polyp was sessile.       The polyp was removed with a cold biopsy forceps. Resection and       retrieval were complete.      An area of congested mucosa was found in the proximal transverse colon.       Biopsies were taken with a cold forceps for histology.      A 10 to 15 mm scar was found in the proximal transverse colon. The scar       tissue was healthy in appearance. Biopsies were taken with a cold       forceps for histology.      Multiple large-mouthed and small-mouthed diverticula were found in the       sigmoid colon and descending colon.      The retroflexed view of the distal rectum and anal verge was normal and       showed no anal or rectal abnormalities. Impression:               -  A few erosions in the terminal ileum. Biopsied.                           - A few erosions in the ascending colon. Biopsied.                           - One 4 mm polyp in the ascending colon, removed                            with a cold snare. Resected and retrieved.                            - One 1 mm polyp in the ascending colon, removed                            with a cold biopsy forceps. Resected and retrieved.                           - Congested mucosa in the proximal transverse                            colon. Biopsied.                           - Scar in the proximal transverse colon. Biopsied.                           - Diverticulosis in the sigmoid colon and in the                            descending colon.                           - The distal rectum and anal verge are normal on                            retroflexion view. Moderate Sedation:      Per Anesthesia Care Recommendation:           - Discharge patient to home (ambulatory).                           - Resume previous diet.                           - Await pathology results.                           - Repeat colonoscopy for surveillance based on                            clinical status - will discuss timing in the office.                           - Schedule CT enterography.                           -  Will discuss Entyvio vs Skyrizi for management of                            CD in the office.                           - Follow up in 2-3 weeks in GI office. Procedure Code(s):        --- Professional ---                           [id number], Colonoscopy, flexible; with removal of                            tumor(s), polyp(s), or other lesion(s) by snare                            technique                           45380, 59, Colonoscopy, flexible; with biopsy,                            single or multiple Diagnosis Code(s):        --- Professional ---                           K63.3, Ulcer of intestine                           D12.2, Benign neoplasm of ascending colon                           K63.89, Other specified diseases of intestine                           K52.9, Noninfective gastroenteritis and colitis,                            unspecified                            K50.10, Crohn's disease of large intestine without                            complications                           K57.30, Diverticulosis of large intestine without                            perforation or abscess without bleeding CPT copyright 2022 [employer]. All rights reserved. The codes documented in this report are preliminary and upon coder review may  be revised to meet current compliance requirements. Katrinka Blazing, MD Katrinka Blazing,  11/11/2022 9:56:48 AM This report has been signed electronically. Number of Addenda: 0

## 2022-11-14 ENCOUNTER — Telehealth (INDEPENDENT_AMBULATORY_CARE_PROVIDER_SITE_OTHER): Payer: Self-pay | Admitting: Gastroenterology

## 2022-11-14 ENCOUNTER — Encounter (INDEPENDENT_AMBULATORY_CARE_PROVIDER_SITE_OTHER): Payer: Self-pay | Admitting: *Deleted

## 2022-11-14 DIAGNOSIS — K5010 Crohn's disease of large intestine without complications: Secondary | ICD-10-CM

## 2022-11-14 LAB — SURGICAL PATHOLOGY

## 2022-11-14 NOTE — Addendum Note (Signed)
Addended by: Marlowe Shores on: 11/14/2022 04:15 PM   Modules accepted: Orders

## 2022-11-14 NOTE — Telephone Encounter (Signed)
Dolores Frame, MD  Marlowe Shores, LPN [facility] Houseman,  Can you please schedule a CT enterography? Dx: Crohns disease.  Thanks,  Katrinka Blazing, MD Gastroenterology and Hepatology [facility]

## 2022-11-14 NOTE — Telephone Encounter (Signed)
PA approved via insurance. Auth number-[id number] Valid from 11/14/22-05/13/23  Pt scheduled for 12/20/22 at 9:00am; pt to arrive at 7:45am to register and begin drinking contrast. Pt contacted and verbalized understanding. Appt letter mailed to pt

## 2022-11-17 ENCOUNTER — Encounter (HOSPITAL_COMMUNITY): Payer: Self-pay | Admitting: Gastroenterology

## 2022-11-17 ENCOUNTER — Encounter (INDEPENDENT_AMBULATORY_CARE_PROVIDER_SITE_OTHER): Payer: Self-pay | Admitting: *Deleted

## 2022-11-25 ENCOUNTER — Other Ambulatory Visit: Payer: Self-pay | Admitting: Physician Assistant

## 2022-11-25 NOTE — Telephone Encounter (Signed)
Last Fill: 08/25/2022  Next Visit: 12/15/2022  Last Visit: 06/10/2022  Dx: Fibromyalgia   Current Dose per office note on 06/10/2022: tizanidine 4 mg at bedtime as needed   Okay to refill Tizanidine?

## 2022-12-02 NOTE — Progress Notes (Unsigned)
Office Visit Note  Patient: Desiree Hogan             Date of Birth: [birth year]           MRN: [id number]             PCP: Ignatius Specking, MD Referring: Ignatius Specking, MD Visit Date: 12/15/2022 Occupation: @GUAROCC @  Subjective:  Trapezius muscle tension   History of Present Illness: Desiree Hogan is a 67 y.o. female with history of fibromyalgia, osteoarthritis, and DDD.  Patient presents today with trapezius muscle tension and tenderness bilaterally.  She has been experiencing muscle spasms intermittently.  She has requested trapezius trigger point injections today.  These injections have provided significant relief in the past.  Patient states she is also been having increased discomfort in her lower back.  She describes the sensation as an aching pain.  She has been taking tramadol as needed for pain relief and continues to take tizanidine as needed for muscle spasms.  She remains on Cymbalta as prescribed.  She continues to experience aching in the left [facility] joint but denies any joint swelling at this time.    Activities of Daily Living:  Patient reports morning stiffness for 30-45 minutes.   Patient Reports nocturnal pain.  Difficulty dressing/grooming: Denies Difficulty climbing stairs: Denies Difficulty getting out of chair: Denies Difficulty using hands for taps, buttons, cutlery, and/or writing: Reports  Review of Systems  Constitutional:  Positive for fatigue.  HENT:  Negative for mouth sores and mouth dryness.   Eyes:  Negative for dryness.  Respiratory:  Negative for shortness of breath.   Cardiovascular:  Negative for chest pain and palpitations.  Gastrointestinal:  Negative for blood in stool, constipation and diarrhea.  Endocrine: Negative for increased urination.  Genitourinary:  Negative for involuntary urination.  Musculoskeletal:  Positive for joint pain, joint pain, joint swelling, myalgias, muscle weakness, morning stiffness, muscle tenderness and myalgias.  Negative for gait problem.  Skin:  Negative for color change, rash, hair loss and sensitivity to sunlight.  Allergic/Immunologic: Positive for susceptible to infections.  Neurological:  Negative for dizziness and headaches.  Hematological:  Negative for swollen glands.  Psychiatric/Behavioral:  Positive for sleep disturbance. Negative for depressed mood. The patient is not nervous/anxious.     PMFS History:  Patient Active Problem List   Diagnosis Date Noted   Elevated fecal calprotectin 12/06/2021   Nausea without vomiting 10/20/2020   Abdominal pain 07/17/2020   Diarrhea 06/23/2020   Diverticulitis of colon 10/11/2017   Crohn's colitis (HCC) 10/11/2017   Fibromyalgia 07/14/2017   ANA positive 07/14/2017   Primary osteoarthritis of both knees 07/14/2017   DDD (degenerative disc disease), lumbar 07/14/2017   Hypermobility of joint 07/14/2017   Age-related osteoporosis without current pathological fracture 07/14/2017   Other insomnia 07/14/2017   History of Crohn's disease 07/14/2017   History of IBS 07/14/2017   History of hypercholesterolemia 07/14/2017   History of COPD 07/14/2017   History of cholecystectomy 07/14/2017   Community acquired pneumonia 12/13/2016   Pneumonia 12/13/2016   Leukocytosis 12/13/2016   Hypokalemia 12/13/2016   Diabetes mellitus without complication (HCC) 12/13/2016    Past Medical History:  Diagnosis Date   Abdominal pain    Anemia    Back pain    Colitis    COPD (chronic obstructive pulmonary disease) (HCC)    not on home o2   Crohn's disease (HCC) 03/29/12   Diabetes mellitus without complication (HCC)    Fibromyalgia  H/O breast biopsy    Hip pain    Hyperlipidemia    Hypothyroidism    Irritable bowel syndrome    Joint pain    Nonspecific abnormal finding in stool contents    Peripheral vascular disease (HCC)    Ulcer    Mouth    Family History  Problem Relation Age of Onset   Cancer Mother        Colon or vaginal ?   Deep  vein thrombosis Mother    Hypertension Father    Diabetes Father    Heart disease Father        Heart Disease before age 57   Diabetes Sister    Diabetes Brother    Hypertension Brother    Healthy Daughter    Healthy Daughter    Healthy Daughter    Healthy Son    Breast cancer Neg Hx    Past Surgical History:  Procedure Laterality Date   BIOPSY  12/20/2017   Procedure: BIOPSY;  Surgeon: Malissa Hippo, MD;  [facility];  Service: Endoscopy;;  cecal erosions   BIOPSY  11/11/2022   Procedure: BIOPSY;  Surgeon: Dolores Frame, MD;  [facility];  Service: Gastroenterology;;   CESAREAN SECTION  06/24/82   CHOLECYSTECTOMY  1993   Gall Bladder   COLONOSCOPY  Jan. 31, 2014   COLONOSCOPY N/A 12/20/2017   Procedure: COLONOSCOPY;  Surgeon: Malissa Hippo, MD;  [facility];  Service: Endoscopy;  Laterality: N/A;  2:25   COLONOSCOPY WITH PROPOFOL N/A 11/11/2022   Procedure: COLONOSCOPY WITH PROPOFOL;  Surgeon: Dolores Frame, MD;  [facility];  Service: Gastroenterology;  Laterality: N/A;  9:30AM;ASA 3   POLYPECTOMY  11/11/2022   Procedure: POLYPECTOMY;  Surgeon: Dolores Frame, MD;  [facility];  Service: Gastroenterology;;   SPINE SURGERY  2010 and 2011   TONSILLECTOMY     Social History   Social History Narrative   Not on file    There is no immunization history on file for this patient.   Objective: Vital Signs: BP 135/83 (BP Location: Left Arm, Patient Position: Sitting, Cuff Size: Normal)   Pulse 85   Resp 12   Ht 5\' 3"  (1.6 m)   Wt 182 lb 6.4 oz (82.7 kg)   BMI 32.31 kg/m    Physical Exam Vitals and nursing note reviewed.  Constitutional:      Appearance: She is well-developed.  HENT:     Head: Normocephalic and atraumatic.  Eyes:     Conjunctiva/sclera: Conjunctivae normal.  Cardiovascular:     Rate and Rhythm: Normal rate and regular rhythm.     Heart sounds: Normal  heart sounds.  Pulmonary:     Effort: Pulmonary effort is normal.     Breath sounds: Normal breath sounds.  Abdominal:     General: Bowel sounds are normal.     Palpations: Abdomen is soft.  Musculoskeletal:     Cervical back: Normal range of motion.  Lymphadenopathy:     Cervical: No cervical adenopathy.  Skin:    General: Skin is warm and dry.     Capillary Refill: Capillary refill takes less than 2 seconds.  Neurological:     Mental Status: She is alert and oriented to person, place, and time.  Psychiatric:        Behavior: Behavior normal.      Musculoskeletal Exam: C-spine, thoracic spine, and lumbar spine good ROM. Trapezius muscle  tension and tenderness bilaterally.  Shoulder joints, elbow joints, wrist joints, MCPs, PIPs, and DIPs good ROM with no synovitis.  Tenderness and prominence of the left CMC joint.  PIP and DIP thickening consistent with OA of both hands.  Complete fist formation bilaterally.  Hip joints have good ROM with no groin pain.  Knee joints have good ROM with no warmth or effusion.  Ankle joints have good ROM with no tenderness or joint swelling.    CDAI Exam: CDAI Score: -- Patient Global: --; Provider Global: -- Swollen: --; Tender: -- Joint Exam 12/15/2022   No joint exam has been documented for this visit   There is currently no information documented on the homunculus. Go to the Rheumatology activity and complete the homunculus joint exam.  Investigation: No additional findings.  Imaging: No results found.  Recent Labs: Lab Results  Component Value Date   WBC 6.6 11/08/2022   HGB 11.9 (L) 11/08/2022   PLT 204 11/08/2022   NA 134 (L) 11/08/2022   K 3.5 11/08/2022   CL 101 11/08/2022   CO2 23 11/08/2022   GLUCOSE 144 (H) 11/08/2022   BUN 17 11/08/2022   CREATININE 0.79 11/08/2022   BILITOT 0.4 07/08/2022   ALKPHOS 85 07/08/2022   AST 26 07/08/2022   ALT 12 07/08/2022   PROT 7.8 07/08/2022   ALBUMIN 4.5 07/08/2022   CALCIUM 8.9  11/08/2022   GFRAA >60 09/01/2017   QFTBGOLDPLUS Negative 01/19/2022    Speciality Comments: No specialty comments available.  Procedures:  Trigger Point Inj  Date/Time: 12/15/2022 1:04 PM  Performed by: Gearldine Bienenstock, PA-C Authorized by: Gearldine Bienenstock, PA-C   Consent Given by:  Patient Site marked: the procedure site was marked   Timeout: prior to procedure the correct patient, procedure, and site was verified   Indications:  Pain Total # of Trigger Points:  2 Location: neck   Needle Size:  27 G Approach:  Dorsal Medications #1:  0.5 mL lidocaine 1 %; 10 mg triamcinolone acetonide 40 MG/ML Medications #2:  0.5 mL lidocaine 1 %; 10 mg triamcinolone acetonide 40 MG/ML Patient tolerance:  Patient tolerated the procedure well with no immediate complications  Allergies: Bactrim [sulfamethoxazole-trimethoprim], Penicillins, Tetracyclines & related, and Ciprofloxacin   Assessment / Plan:     Visit Diagnoses: Fibromyalgia -Patient continues to experience intermittent myalgias and muscle tenderness due to fibromyalgia.  She presents today with trapezius muscle tension and tenderness bilaterally.  She has been experiencing muscle spasms intermittently.  She takes tizanidine 4 mg at bedtime as needed for muscle spasms and remains on tramadol 50 mg 1 tablet 3 times daily for pain relief.  She remains on Cymbalta as prescribed.  A refill of Cymbalta was sent to the pharmacy today. To alleviate her current symptoms she requested trapezius trigger point injections bilaterally.  She tolerated the procedures well.  Procedure note was completed above.  Aftercare was discussed.  She is vies notify us if her symptoms persist or worsen.  Discussed the importance of regular exercise and good sleep hygiene.  She will follow-up in the office in 6 months or sooner if needed.  Trapezius muscle spasm: Patient presents today with trapezius muscle tension and tenderness bilaterally.  She has been experiencing  muscle spasms intermittently.  Patient requested bilateral trapezius trigger point injections today.  She tolerated procedures well.  Procedure notes were completed above.  Aftercare was discussed.  She was vies notify us if her symptoms persist or worsen.  She plans  on continuing to take tizanidine 4 mg at bedtime as needed for muscle spasms.  ANA positive - +RF, -CCP: No clinical features of systemic lupus at this time.  Chronic SI joint pain: No SI joint tenderness at this time.  Primary osteoarthritis of both knees: Good range of motion of both knee joints.  No warmth or effusion noted.  She experiences intermittent aching in both knees especially with walking or standing for prolonged periods of time.  She uses Voltaren gel topically as needed for pain relief.  Degeneration of intervertebral disc of lumbar region without discogenic back pain or lower extremity pain: Patient continues to have chronic discomfort in her lower back.  She describes the pain as an aching sensation.  No symptoms of radiculopathy.   Hypermobility of joint  Age-related osteoporosis without current pathological fracture - Managed by PCP. She takes Fosamax 70 mg 1 tablet by mouth once weekly for management of osteoporosis.  No DEXA in Epic to review.  Other insomnia: She experiences nocturnal pain causing interrupted sleep at night.  Discussed the importance of good sleep hygiene.  Other medical conditions are listed as follows:   History of hypercholesterolemia  History of COPD  History of Crohn's disease  History of IBS  History of cholecystectomy  Orders: Orders Placed This Encounter  Procedures   Trigger Point Inj   Meds ordered this encounter  Medications   DULoxetine (CYMBALTA) 30 MG capsule    Sig: Take 1 capsule (30 mg total) by mouth daily.    Dispense:  90 capsule    Refill:  0     Follow-Up Instructions: Return in about 6 months (around 06/15/2023) for Fibromyalgia,  Osteoarthritis.   Gearldine Bienenstock, PA-C  Note - This record has been created using Dragon software.  Chart creation errors have been sought, but may not always  have been located. Such creation errors do not reflect on  the standard of medical care.

## 2022-12-05 ENCOUNTER — Ambulatory Visit (INDEPENDENT_AMBULATORY_CARE_PROVIDER_SITE_OTHER): Payer: Medicare HMO | Admitting: Gastroenterology

## 2022-12-12 ENCOUNTER — Other Ambulatory Visit: Payer: Self-pay | Admitting: Registered Nurse

## 2022-12-13 ENCOUNTER — Telehealth: Payer: Self-pay | Admitting: Registered Nurse

## 2022-12-13 MED ORDER — PREGABALIN 50 MG PO CAPS: 50.0000 mg | ORAL_CAPSULE | Freq: Two times a day (BID) | ORAL | 5 refills | Status: DC

## 2022-12-13 NOTE — Telephone Encounter (Signed)
PMP was Reviewed.  Pregabalin e-scribed to pharmacy.

## 2022-12-15 ENCOUNTER — Encounter: Payer: Self-pay | Admitting: Physician Assistant

## 2022-12-15 ENCOUNTER — Ambulatory Visit: Payer: Medicare HMO | Attending: Physician Assistant | Admitting: Physician Assistant

## 2022-12-15 VITALS — BP 135/83 | HR 85 | Resp 12 | Ht 63.0 in | Wt 182.4 lb

## 2022-12-15 DIAGNOSIS — Z8709 Personal history of other diseases of the respiratory system: Secondary | ICD-10-CM

## 2022-12-15 DIAGNOSIS — G8929 Other chronic pain: Secondary | ICD-10-CM

## 2022-12-15 DIAGNOSIS — Z8719 Personal history of other diseases of the digestive system: Secondary | ICD-10-CM

## 2022-12-15 DIAGNOSIS — G4709 Other insomnia: Secondary | ICD-10-CM

## 2022-12-15 DIAGNOSIS — M797 Fibromyalgia: Secondary | ICD-10-CM | POA: Diagnosis not present

## 2022-12-15 DIAGNOSIS — M533 Sacrococcygeal disorders, not elsewhere classified: Secondary | ICD-10-CM

## 2022-12-15 DIAGNOSIS — M62838 Other muscle spasm: Secondary | ICD-10-CM

## 2022-12-15 DIAGNOSIS — R768 Other specified abnormal immunological findings in serum: Secondary | ICD-10-CM

## 2022-12-15 DIAGNOSIS — Z9049 Acquired absence of other specified parts of digestive tract: Secondary | ICD-10-CM

## 2022-12-15 DIAGNOSIS — M810 Age-related osteoporosis without current pathological fracture: Secondary | ICD-10-CM

## 2022-12-15 DIAGNOSIS — M51369 Other intervertebral disc degeneration, lumbar region without mention of lumbar back pain or lower extremity pain: Secondary | ICD-10-CM

## 2022-12-15 DIAGNOSIS — M249 Joint derangement, unspecified: Secondary | ICD-10-CM

## 2022-12-15 DIAGNOSIS — M170 Bilateral primary osteoarthritis of knee: Secondary | ICD-10-CM

## 2022-12-15 DIAGNOSIS — Z8639 Personal history of other endocrine, nutritional and metabolic disease: Secondary | ICD-10-CM

## 2022-12-15 MED ORDER — TRIAMCINOLONE ACETONIDE 40 MG/ML IJ SUSP
10.0000 mg | INTRAMUSCULAR | Status: AC | PRN
  Administered 2022-12-15: 10 mg via INTRAMUSCULAR

## 2022-12-15 MED ORDER — LIDOCAINE HCL 1 % IJ SOLN
0.5000 mL | INTRAMUSCULAR | Status: AC | PRN
  Administered 2022-12-15: .5 mL

## 2022-12-15 MED ORDER — DULOXETINE HCL 30 MG PO CPEP: 30.0000 mg | ORAL_CAPSULE | Freq: Every day | ORAL | 0 refills | Status: DC

## 2022-12-15 NOTE — Progress Notes (Signed)
Subjective:    Patient ID: Desiree Hogan, female    DOB: August 12, 1955, 67 y.o.   MRN: [id number]  HPI: Desiree Hogan is a 67 y.o. female who returns for follow up appointment for chronic pain and medication refill. She states her pain is located in her upper extremities reports tingling and burning pain, lower back and left knee pain. She also reports she has Fibro Pain. She rates her pain 4. Her current exercise regime is walking and performing stretching exercises.  Ms. Chheng arrives to office with hypertension, blood pressure was rechecked. She reports she checks her blood pressure at home, she will keep blood pressure log and F/U with her PCP.   Ms, Ullmer Morphine equivalent is 30.00 MME.   Last UDS was Performed 06/10/2022, it was consistent.      Pain Inventory Average Pain 5 Pain Right Now 4 My pain is constant  In the last 24 hours, has pain interfered with the following? General activity 0 Relation with others 0 Enjoyment of life 0 What TIME of day is your pain at its worst? evening Sleep (in general) Fair  Pain is worse with: some activites Pain improves with: medication Relief from Meds: 4  Family History  Problem Relation Age of Onset   Cancer Mother        Colon or vaginal ?   Deep vein thrombosis Mother    Hypertension Father    Diabetes Father    Heart disease Father        Heart Disease before age 65   Diabetes Sister    Diabetes Brother    Hypertension Brother    Healthy Daughter    Healthy Daughter    Healthy Daughter    Healthy Son    Breast cancer Neg Hx    Social History   Socioeconomic History   Marital status: Married    Spouse name: Not on file   Number of children: Not on file   Years of education: Not on file   Highest education level: Not on file  Occupational History   Not on file  Tobacco Use   Smoking status: Former    Current packs/day: 0.00    Average packs/day: 0.8 packs/day for 20.0 years (15.0 ttl pk-yrs)    Types:  Cigarettes    Start date: 01/03/1989    Quit date: 01/03/2009    Years since quitting: 13.9    Passive exposure: Never   Smokeless tobacco: Never  Vaping Use   Vaping status: Never Used  Substance and Sexual Activity   Alcohol use: No   Drug use: No   Sexual activity: Not on file  Other Topics Concern   Not on file  Social History Narrative   Not on file   Social Drivers of Health   Financial Resource Strain: Not on file  Food Insecurity: Not on file  Transportation Needs: Not on file  Physical Activity: Not on file  Stress: Not on file  Social Connections: Not on file   Past Surgical History:  Procedure Laterality Date   BIOPSY  12/20/2017   Procedure: BIOPSY;  Surgeon: Malissa Hippo, MD;  [facility];  Service: Endoscopy;;  cecal erosions   BIOPSY  11/11/2022   Procedure: BIOPSY;  Surgeon: Dolores Frame, MD;  [facility];  Service: Gastroenterology;;   CESAREAN SECTION  06/24/82   CHOLECYSTECTOMY  1993   Gall Bladder   COLONOSCOPY  Jan. 31, 2014   COLONOSCOPY  N/A 12/20/2017   Procedure: COLONOSCOPY;  Surgeon: Malissa Hippo, MD;  [facility];  Service: Endoscopy;  Laterality: N/A;  2:25   COLONOSCOPY WITH PROPOFOL N/A 11/11/2022   Procedure: COLONOSCOPY WITH PROPOFOL;  Surgeon: Dolores Frame, MD;  [facility];  Service: Gastroenterology;  Laterality: N/A;  9:30AM;ASA 3   POLYPECTOMY  11/11/2022   Procedure: POLYPECTOMY;  Surgeon: Dolores Frame, MD;  [facility];  Service: Gastroenterology;;   [facility] SURGERY  2010 and 2011   TONSILLECTOMY     Past Surgical History:  Procedure Laterality Date   BIOPSY  12/20/2017   Procedure: BIOPSY;  Surgeon: Malissa Hippo, MD;  [facility];  Service: Endoscopy;;  cecal erosions   BIOPSY  11/11/2022   Procedure: BIOPSY;  Surgeon: Dolores Frame, MD;  [facility];  Service: Gastroenterology;;   CESAREAN  SECTION  06/24/82   CHOLECYSTECTOMY  1993   Gall Bladder   COLONOSCOPY  Jan. 31, 2014   COLONOSCOPY N/A 12/20/2017   Procedure: COLONOSCOPY;  Surgeon: Malissa Hippo, MD;  [facility];  Service: Endoscopy;  Laterality: N/A;  2:25   COLONOSCOPY WITH PROPOFOL N/A 11/11/2022   Procedure: COLONOSCOPY WITH PROPOFOL;  Surgeon: Dolores Frame, MD;  [facility];  Service: Gastroenterology;  Laterality: N/A;  9:30AM;ASA 3   POLYPECTOMY  11/11/2022   Procedure: POLYPECTOMY;  Surgeon: Marguerita Merles, Reuel Boom, MD;  [facility];  Service: Gastroenterology;;   SPINE SURGERY  2010 and 2011   TONSILLECTOMY     Past Medical History:  Diagnosis Date   Abdominal pain    Anemia    Back pain    Colitis    COPD (chronic obstructive pulmonary disease) (HCC)    not on home o2   Crohn's disease (HCC) 03/29/12   Diabetes mellitus without complication (HCC)    Fibromyalgia    H/O breast biopsy    Hip pain    Hyperlipidemia    Hypothyroidism    Irritable bowel syndrome    Joint pain    Nonspecific abnormal finding in stool contents    Peripheral vascular disease (HCC)    Ulcer    Mouth   BP (!) 160/81   Pulse 77   Ht 5\' 3"  (1.6 m)   Wt 181 lb (82.1 kg)   SpO2 95%   BMI 32.06 kg/m   Opioid Risk Score:   Fall Risk Score:  `1  Depression screen PHQ 2/9     06/10/2022    2:14 PM 06/08/2021    2:19 PM 11/17/2020    1:41 PM 06/16/2020    1:38 PM 10/24/2019    1:34 PM 03/26/2019   12:41 PM  Depression screen PHQ 2/9  Decreased Interest 0 0 0 0 1 2  Down, Depressed, Hopeless 0 0 0 0 1 1  PHQ - 2 Score 0 0 0 0 2 3  Altered sleeping      2  Tired, decreased energy      2  Change in appetite      0  Feeling bad or failure about yourself       0  Trouble concentrating      2  Moving slowly or fidgety/restless      1  Suicidal thoughts      0  PHQ-9 Score      10    Review of Systems  Musculoskeletal:  Positive for back  pain.       Bilateral  elbow pain Bilateral shoulder pain  All other systems reviewed and are negative.     Objective:   Physical Exam Vitals and nursing note reviewed.  Constitutional:      Appearance: Normal appearance.  Cardiovascular:     Rate and Rhythm: Normal rate and regular rhythm.     Pulses: Normal pulses.     Heart sounds: Normal heart sounds.  Neurological:     Mental Status: She is alert.           Assessment & Plan:  Bilateral Shoulder Pain: No complaints today. Continue HEP as Tolerated. Continue current medication regimen. Continue to Monitor. 12/ 13/2024 Lower Back Pain without Sciatica: Continue HEP as Tolerated. Continue current medication regimen. Continue to Monitor. 12/16/2022 Primary OA in Bilateral Knees:L>R. Continue HEP as Tolerated. Continue Current Medication Regimen. Continue to Monitor. 12/16/2022 Fibromyalgia: Continue HEP as Tolerated. Continue Lyrica.  Continue to Monitor. 12/16/2022 Chronic Pain Syndrome: Continue  Tramadol 50 mg three times a day as needed for pain #90. We will continue the opioid monitoring program, this consists of regular clinic visits, examinations, urine drug screen, pill counts as well as use of [place] Controlled Substance Reporting system. A 12 month History has been reviewed on the [place] Controlled Substance Reporting System on 12/16/2022.   F/U in 6 months

## 2022-12-16 ENCOUNTER — Encounter: Payer: Medicare HMO | Attending: Registered Nurse | Admitting: Registered Nurse

## 2022-12-16 ENCOUNTER — Encounter: Payer: Self-pay | Admitting: Registered Nurse

## 2022-12-16 VITALS — BP 160/78 | HR 77 | Ht 63.0 in | Wt 181.0 lb

## 2022-12-16 DIAGNOSIS — G8929 Other chronic pain: Secondary | ICD-10-CM | POA: Diagnosis not present

## 2022-12-16 DIAGNOSIS — M1712 Unilateral primary osteoarthritis, left knee: Secondary | ICD-10-CM

## 2022-12-16 DIAGNOSIS — G894 Chronic pain syndrome: Secondary | ICD-10-CM

## 2022-12-16 DIAGNOSIS — M545 Low back pain, unspecified: Secondary | ICD-10-CM | POA: Diagnosis not present

## 2022-12-16 DIAGNOSIS — M797 Fibromyalgia: Secondary | ICD-10-CM | POA: Diagnosis not present

## 2022-12-16 DIAGNOSIS — Z79891 Long term (current) use of opiate analgesic: Secondary | ICD-10-CM | POA: Diagnosis not present

## 2022-12-16 DIAGNOSIS — Z5181 Encounter for therapeutic drug level monitoring: Secondary | ICD-10-CM | POA: Diagnosis not present

## 2022-12-16 MED ORDER — PREGABALIN 50 MG PO CAPS: 50.0000 mg | ORAL_CAPSULE | Freq: Two times a day (BID) | ORAL | 5 refills | Status: DC

## 2022-12-16 MED ORDER — TRAMADOL HCL 50 MG PO TABS: 50.0000 mg | ORAL_TABLET | Freq: Three times a day (TID) | ORAL | 5 refills | Status: DC | PRN

## 2022-12-20 ENCOUNTER — Ambulatory Visit (HOSPITAL_COMMUNITY)
Admission: RE | Admit: 2022-12-20 | Discharge: 2022-12-20 | Disposition: A | Discharge status: Home / Self Care | Payer: Medicare HMO | Source: Ambulatory Visit | Attending: Gastroenterology | Admitting: Gastroenterology

## 2022-12-20 DIAGNOSIS — K5010 Crohn's disease of large intestine without complications: Secondary | ICD-10-CM | POA: Insufficient documentation

## 2022-12-20 DIAGNOSIS — K5730 Diverticulosis of large intestine without perforation or abscess without bleeding: Secondary | ICD-10-CM | POA: Diagnosis not present

## 2022-12-20 DIAGNOSIS — Z9049 Acquired absence of other specified parts of digestive tract: Secondary | ICD-10-CM | POA: Diagnosis not present

## 2022-12-20 DIAGNOSIS — K5090 Crohn's disease, unspecified, without complications: Secondary | ICD-10-CM | POA: Diagnosis not present

## 2022-12-20 MED ORDER — IOHEXOL 300 MG/ML  SOLN
125.0000 mL | Freq: Once | INTRAMUSCULAR | Status: AC | PRN
  Administered 2022-12-20: 125 mL via INTRAVENOUS

## 2023-01-03 ENCOUNTER — Encounter (INDEPENDENT_AMBULATORY_CARE_PROVIDER_SITE_OTHER): Payer: Self-pay

## 2023-01-05 ENCOUNTER — Telehealth (INDEPENDENT_AMBULATORY_CARE_PROVIDER_SITE_OTHER): Payer: Self-pay

## 2023-01-05 ENCOUNTER — Encounter (INDEPENDENT_AMBULATORY_CARE_PROVIDER_SITE_OTHER): Payer: Self-pay | Admitting: Gastroenterology

## 2023-01-05 ENCOUNTER — Ambulatory Visit (INDEPENDENT_AMBULATORY_CARE_PROVIDER_SITE_OTHER): Payer: Medicare HMO | Admitting: Gastroenterology

## 2023-01-05 VITALS — BP 153/84 | HR 83 | Temp 97.1°F | Ht 63.0 in | Wt 180.6 lb

## 2023-01-05 DIAGNOSIS — Z1321 Encounter for screening for nutritional disorder: Secondary | ICD-10-CM

## 2023-01-05 DIAGNOSIS — R197 Diarrhea, unspecified: Secondary | ICD-10-CM

## 2023-01-05 DIAGNOSIS — K5080 Crohn's disease of both small and large intestine without complications: Secondary | ICD-10-CM

## 2023-01-05 DIAGNOSIS — Z111 Encounter for screening for respiratory tuberculosis: Secondary | ICD-10-CM | POA: Diagnosis not present

## 2023-01-05 DIAGNOSIS — R195 Other fecal abnormalities: Secondary | ICD-10-CM

## 2023-01-05 DIAGNOSIS — Z1159 Encounter for screening for other viral diseases: Secondary | ICD-10-CM

## 2023-01-05 NOTE — Telephone Encounter (Signed)
 Per Wynelle Link with [facility] she has already submitted the PA for Stelara.

## 2023-01-05 NOTE — Telephone Encounter (Signed)
 Dolores Frame, MD  Meredith Leeds, CMA Hi, Please start authorization for Stelara every 8 weeks.  Diagnosis ileocolonic Crohn's disease Thanks

## 2023-01-05 NOTE — Telephone Encounter (Signed)
 Per [facility] Auth # [id number] #, Case # [id number], ID # [id number] Stelara 130 mg/26 Approved from 01/05/2023-01/05/2024.

## 2023-01-05 NOTE — Patient Instructions (Addendum)
 Start authorization for Stelara induction and SQ shots every 8 weeks As per PCP about pneumonia vaccination and second dose of shingles vaccination, follow-up with PCP regarding osteopenia surveillance Perform blood workup

## 2023-01-05 NOTE — Telephone Encounter (Signed)
 Thanks

## 2023-01-05 NOTE — Progress Notes (Signed)
 Desiree Hogan, M.D. Gastroenterology & Hepatology [facility] [address] [place], [place] [address]  Primary Care Physician: Rosamond Leta NOVAK, MD [address] [place] [address]  I will communicate my assessment and recommendations to the referring MD via EMR.  Problems: Crohn's ileocolitis  History of Present Illness: Desiree Hogan is a 68 y.o. female with pmh of Crohn's disease, anemia, COPD, DM, fibromyalgia, HLD, Hypothyroidism, IBS, PVD,  who presents for follow up of Crohn's disease.  The patient was last seen on 06/07/2022. At that time, the patient was scheduled for a colonoscopy with findings described low.  She was continued on mesalamine  in the interim.  Given presence of active Crohn's disease on colonoscopy and biopsies, CT enterography was performed on 12/20/2022 to assess extension which showed left colonic diverticulosis but no evidence of active inflammation in the colon.  States that after her last colonoscopy, she had some improvement of her symptoms as her abdominal cramping has decreased  in severity but the cramping is still present in the hypogastric area. Occasionally has some nausea without vomiting.  She is still having episodes of diarrhea, 3-5 times a day which is watery in nature. No melena or hematochezia.  The patient denies having any nausea, vomiting, fever, chills, hematochezia, melena, hematemesis, jaundice, pruritus or weight loss.  Last prednisone  dose was a month ago, took it for 5 days for sinus infection.  Last flu shot:2024 Last pneumonia shot:never Last Pap smear: advised to stop by PCP Last evaluation by dermatology: never Last zoster vaccine: 1 dose - 3 years Last DEXA scan: last year per patient, had osteopenia - following with PCP COVID-19 shot: none  Last Colonoscopy: 11/11/2022 Terminal ileum with few erosions, few erosions in the ascending colon, 4 mm polyp in the ascending colon, 1 mm  polyp in the ascending colon, congested mucosa in the proximal transverse colon, presence of a 10 to 15 mm scar in the proximal transverse colon, diverticulosis.  A. COLON, PROXIMAL TRANSVERSE SCAR, BIOPSY:      Colonic mucosa with focal mild crypt architectural disarray.      Negative for activity, granuloma, dysplasia or malignancy.  B. SMALL BOWEL, BIOPSY:      Active ileitis with crypt architectural disarray.      Negative for granuloma, dysplasia or malignancy.  C. COLON, ASCENDING, POLYPECTOMY:      Polypoid low-grade dysplasia.      Background colonic mucosa with active colitis and focal mild crypt architectural disarray.      Negative for granuloma, dysplasia or malignancy.  D. COLON, CECUM, ASCENDING, BIOPSY:      Colonic mucosa with active colitis and focal mild crypt architectural disarray.      Negative for granuloma, dysplasia or malignancy.  E. COLON, TRANSVERSE, BIOPSY:      Colonic mucosa with focal mild crypt architectural disarray.      Negative for activity, granuloma, dysplasia or malignancy   Patient was recommended to discuss treatment options for Crohn's disease in the office.  Timing of repeat colonoscopy will depend on treatment response to advanced therapies.  Past Medical History: Past Medical History:  Diagnosis Date   Abdominal pain    Anemia    Back pain    Colitis    COPD (chronic obstructive pulmonary disease) (HCC)    not on home o2   Crohn's disease (HCC) 03/29/12   Diabetes mellitus without complication (HCC)    Fibromyalgia    H/O breast biopsy  Hip pain    Hyperlipidemia    Hypothyroidism    Irritable bowel syndrome    Joint pain    Nonspecific abnormal finding in stool contents    Peripheral vascular disease (HCC)    Ulcer    Mouth    Past Surgical History: Past Surgical History:  Procedure Laterality Date   BIOPSY  12/20/2017   Procedure: BIOPSY;  Surgeon: Golda Claudis PENNER, MD;  [facility]: [facility];  Service:  Endoscopy;;  cecal erosions   BIOPSY  11/11/2022   Procedure: BIOPSY;  Surgeon: Eartha Angelia Sieving, MD;  Location: [facility];  Service: Gastroenterology;;   CESAREAN SECTION  06/24/82   CHOLECYSTECTOMY  1993   Gall Bladder   COLONOSCOPY  Jan. 31, 2014   COLONOSCOPY N/A 12/20/2017   Procedure: COLONOSCOPY;  Surgeon: Golda Claudis PENNER, MD;  Location: [facility];  Service: Endoscopy;  Laterality: N/A;  2:25   COLONOSCOPY WITH PROPOFOL  N/A 11/11/2022   Procedure: COLONOSCOPY WITH PROPOFOL ;  Surgeon: Eartha Angelia Sieving, MD;  [facility]: [facility];  Service: Gastroenterology;  Laterality: N/A;  9:30AM;ASA 3   POLYPECTOMY  11/11/2022   Procedure: POLYPECTOMY;  Surgeon: Eartha Angelia, Sieving, MD;  Location: [facility];  Service: Gastroenterology;;   SPINE SURGERY  2010 and 2011   TONSILLECTOMY      Family History: Family History  Problem Relation Age of Onset   Cancer Mother        Colon or vaginal ?   Deep vein thrombosis Mother    Hypertension Father    Diabetes Father    Heart disease Father        Heart Disease before age 52   Diabetes Sister    Diabetes Brother    Hypertension Brother    Healthy Daughter    Healthy Daughter    Healthy Daughter    Healthy Son    Breast cancer Neg Hx     Social History: Social History   Tobacco Use  Smoking Status Former   Current packs/day: 0.00   Average packs/day: 0.8 packs/day for 20.0 years (15.0 ttl pk-yrs)   Types: Cigarettes   Start date: 01/03/1989   Quit date: 01/03/2009   Years since quitting: 14.0   Passive exposure: Never  Smokeless Tobacco Never   Social History   Substance and Sexual Activity  Alcohol  Use No   Social History   Substance and Sexual Activity  Drug Use No    Allergies: Allergies  Allergen Reactions   Bactrim [Sulfamethoxazole-Trimethoprim] Itching    And sores   Penicillins Anaphylaxis    Has patient had a PCN reaction causing immediate rash, facial/tongue/throat  swelling, SOB or lightheadedness with hypotension: Yes Has patient had a PCN reaction causing severe rash involving mucus membranes or skin necrosis: Yes Has patient had a PCN reaction that required hospitalization: No Has patient had a PCN reaction occurring within the last 10 years: No If all of the above answers are NO, then may proceed with Cephalosporin use.    Tetracyclines & Related Swelling and Rash   Ciprofloxacin      Blisters and facial swelling    Medications: Current Outpatient Medications  Medication Sig Dispense Refill   alendronate (FOSAMAX) 70 MG tablet Take 70 mg by mouth every Friday. Take with a full glass of water  on an empty stomach.     BREO ELLIPTA  100-25 MCG/INH AEPB Inhale 1 puff into the lungs daily.      CALCIUM  GLUCONATE PO Take 600 mg  by mouth 2 (two) times daily.      cetirizine (ZYRTEC) 10 MG tablet Take 10 mg by mouth daily.     dicyclomine  (BENTYL ) 10 MG capsule TAKE 1 CAPSULE (10 MG TOTAL) BY MOUTH 3 (THREE) TIMES DAILY AS NEEDED FOR SPASMS. 270 capsule 1   DULoxetine  (CYMBALTA ) 30 MG capsule Take 1 capsule (30 mg total) by mouth daily. 90 capsule 0   gemfibrozil  (LOPID ) 600 MG tablet Take 600 mg by mouth 2 (two) times daily.     levothyroxine  (SYNTHROID , LEVOTHROID) 125 MCG tablet Take 125 mcg by mouth daily.  3   lisinopril (ZESTRIL) 2.5 MG tablet Take 2.5 mg by mouth daily.     Mesalamine  (ASACOL ) 400 MG CPDR DR capsule TAKE 2 CAPSULES (800 MG TOTAL) BY MOUTH 2 (TWO) TIMES DAILY. 360 capsule 0   ondansetron  (ZOFRAN ) 4 MG tablet TAKE 1 TABLET (4 MG TOTAL) BY MOUTH 2 (TWO) TIMES DAILY AS NEEDED FOR NAUSEA OR VOMITING. 30 tablet 1   pregabalin  (LYRICA ) 50 MG capsule Take 1 capsule (50 mg total) by mouth 2 (two) times daily. 60 capsule 5   PROAIR  HFA 108 (90 Base) MCG/ACT inhaler Inhale 1-2 puffs into the lungs every 6 (six) hours as needed for wheezing or shortness of breath.      rosuvastatin  (CRESTOR ) 10 MG tablet Take 10 mg by mouth daily.  3    tiZANidine  (ZANAFLEX ) 4 MG tablet TAKE 1 TABLET BY MOUTH AT BEDTIME AS NEEDED FOR MUSCLE SPASMS. 90 tablet 0   traMADol  (ULTRAM ) 50 MG tablet Take 1 tablet (50 mg total) by mouth 3 (three) times daily as needed. 90 tablet 5   No current facility-administered medications for this visit.    Review of Systems: GENERAL: negative for malaise, night sweats HEENT: No changes in hearing or vision, no nose bleeds or other nasal problems. NECK: Negative for lumps, goiter, pain and significant neck swelling RESPIRATORY: Negative for cough, wheezing CARDIOVASCULAR: Negative for chest pain, leg swelling, palpitations, orthopnea GI: SEE HPI MUSCULOSKELETAL: Negative for joint pain or swelling, back pain, and muscle pain. SKIN: Negative for lesions, rash PSYCH: Negative for sleep disturbance, mood disorder and recent psychosocial stressors. HEMATOLOGY Negative for prolonged bleeding, bruising easily, and swollen nodes. ENDOCRINE: Negative for cold or heat intolerance, polyuria, polydipsia and goiter. NEURO: negative for tremor, gait imbalance, syncope and seizures. The remainder of the review of systems is noncontributory.   Physical Exam: BP (!) 153/84 (BP Location: Right Arm, Patient Position: Sitting, Cuff Size: Normal)   Pulse 83   Temp (!) 97.1 F (36.2 C) (Temporal)   Ht 5' 3 (1.6 m)   Wt 180 lb 9.6 oz (81.9 kg)   BMI 31.99 kg/m  GENERAL: The patient is AO x3, in no acute distress. HEENT: Head is normocephalic and atraumatic. EOMI are intact. Mouth is well hydrated and without lesions. NECK: Supple. No masses LUNGS: Clear to auscultation. No presence of rhonchi/wheezing/rales. Adequate chest expansion HEART: RRR, normal s1 and s2. ABDOMEN:  tender to palpation in the epigastric area, no guarding, no peritoneal signs, and nondistended. BS +. No masses. EXTREMITIES: Without any cyanosis, clubbing, rash, lesions or edema. NEUROLOGIC: AOx3, no focal motor deficit. SKIN: no jaundice, no  rashes  Imaging/Labs: as above  I personally reviewed and interpreted the available labs, imaging and endoscopic files.  Impression and Plan: Desiree Hogan is a 68 y.o. female with pmh of Crohn's disease, anemia, COPD, DM, fibromyalgia, HLD, Hypothyroidism, IBS, PVD,  who presents for follow  up of Crohn's disease.  Patient had evidence of moderate small bowel and colonic inflammation most recent colonoscopy and biopsies which close in agreement with elevated fecal calprotectin in the past.  She is still symptomatic as she is presenting significant diarrhea, although the abdominal pain has improved.  Desiree Hogan will require treatment with steroid-sparing medications.  An extensive discussion was held regarding benefits and side effects of advanced medical therapies for Crohn's disease (including infections, malignancies such as lymphoma, skin cancer, tolerance to medication), as well as need to control her disease clinically and endoscopically (ideally microscopically as well) to avoid recurrence of her symptomatology.  Given the patient's age, we discussed the possibility of treating her disease with Entyvio  versus an anti IL-23.  Patient will like to try Stelara  as her first treatment as she is nave to advance medical therapies.  Will obtain baseline labs today and start authorization process.  In terms of her preventative measures, the patient should ask her PCP about pneumonia vaccination and shingles vaccination.  She will continue osteopenia surveillance with PCP.  We will discuss dermatology referral next appointment.  -Start authorization for Stelara  induction and SQ shots every 8 weeks As per PCP about pneumonia vaccination and second dose of shingles vaccination, follow-up with PCP regarding osteopenia surveillance -Check CBC, CMP, CRP, vitamin D , TB testing and hepatitis B surface antigen  All questions were answered.      Desiree Fortune, MD Gastroenterology and  Hepatology [facility] Gastroenterology

## 2023-01-06 ENCOUNTER — Telehealth: Payer: Self-pay

## 2023-01-06 NOTE — Telephone Encounter (Signed)
 Noted, Thanks

## 2023-01-06 NOTE — Telephone Encounter (Signed)
 Hello,   Patient will be scheduled as soon as possible.   Auth Submission: APPROVED Site of care: Site of care: [facility] Payer: [employer] Medication & CPT/J Code(s) submitted: Stelara  Infusion (Ustekinumab ) [id number] Route of submission (phone, fax, portal): portal Phone # Fax # Auth type: Buy/Bill PB Units/visits requested: 390mg , 1 dose Reference number: [id number] Approval from: 01/05/23 to 01/05/24

## 2023-01-06 NOTE — Telephone Encounter (Signed)
 Message below from [employer] from [facility] Infusion clinic.

## 2023-01-07 LAB — COMPREHENSIVE METABOLIC PANEL
AG Ratio: 1.2 (calc) (ref 1.0–2.5)
ALT: 15 U/L (ref 6–29)
AST: 28 U/L (ref 10–35)
Albumin: 4.5 g/dL (ref 3.6–5.1)
Alkaline phosphatase (APISO): 126 U/L (ref 37–153)
BUN: 15 mg/dL (ref 7–25)
CO2: 27 mmol/L (ref 20–32)
Calcium: 10 mg/dL (ref 8.6–10.4)
Chloride: 102 mmol/L (ref 98–110)
Creat: 0.93 mg/dL (ref 0.50–1.05)
Globulin: 3.7 g/dL (ref 1.9–3.7)
Glucose, Bld: 142 mg/dL — ABNORMAL HIGH (ref 65–139)
Potassium: 5 mmol/L (ref 3.5–5.3)
Sodium: 139 mmol/L (ref 135–146)
Total Bilirubin: 0.4 mg/dL (ref 0.2–1.2)
Total Protein: 8.2 g/dL — ABNORMAL HIGH (ref 6.1–8.1)

## 2023-01-07 LAB — CBC WITH DIFFERENTIAL/PLATELET
Absolute Lymphocytes: 1363 {cells}/uL (ref 850–3900)
Absolute Monocytes: 534 {cells}/uL (ref 200–950)
Basophils Absolute: 70 {cells}/uL (ref 0–200)
Basophils Relative: 1.2 %
Eosinophils Absolute: 244 {cells}/uL (ref 15–500)
Eosinophils Relative: 4.2 %
HCT: 39.5 % (ref 35.0–45.0)
Hemoglobin: 12.8 g/dL (ref 11.7–15.5)
MCH: 28.6 pg (ref 27.0–33.0)
MCHC: 32.4 g/dL (ref 32.0–36.0)
MCV: 88.4 fL (ref 80.0–100.0)
MPV: 13 fL — ABNORMAL HIGH (ref 7.5–12.5)
Monocytes Relative: 9.2 %
Neutro Abs: 3590 {cells}/uL (ref 1500–7800)
Neutrophils Relative %: 61.9 %
Platelets: 250 10*3/uL (ref 140–400)
RBC: 4.47 10*6/uL (ref 3.80–5.10)
RDW: 12.5 % (ref 11.0–15.0)
Total Lymphocyte: 23.5 %
WBC: 5.8 10*3/uL (ref 3.8–10.8)

## 2023-01-07 LAB — QUANTIFERON-TB GOLD PLUS
Mitogen-NIL: 5.43 [IU]/mL
NIL: 0.03 [IU]/mL
QuantiFERON-TB Gold Plus: NEGATIVE
TB1-NIL: 0 [IU]/mL
TB2-NIL: 0 [IU]/mL

## 2023-01-07 LAB — VITAMIN D 25 HYDROXY (VIT D DEFICIENCY, FRACTURES): Vit D, 25-Hydroxy: 33 ng/mL (ref 30–100)

## 2023-01-07 LAB — HEPATITIS B SURFACE ANTIGEN: Hepatitis B Surface Ag: NONREACTIVE

## 2023-01-07 LAB — C-REACTIVE PROTEIN: CRP: <3 mg/L (ref <8.0)

## 2023-01-09 ENCOUNTER — Encounter (INDEPENDENT_AMBULATORY_CARE_PROVIDER_SITE_OTHER): Payer: Self-pay

## 2023-01-11 NOTE — Telephone Encounter (Signed)
 Correction: Patient is scheduled 01/17/2023 at 1:30 pm at [facility] Infusion clinic.

## 2023-01-11 NOTE — Telephone Encounter (Signed)
 Looks like first infusion is scheduled for 01/16/2023 at 1:30 pm at [facility] infusion clinic.

## 2023-01-17 ENCOUNTER — Emergency Department (HOSPITAL_COMMUNITY): Payer: Medicare HMO

## 2023-01-17 ENCOUNTER — Other Ambulatory Visit: Payer: Self-pay

## 2023-01-17 ENCOUNTER — Encounter: Payer: Medicare HMO | Attending: Gastroenterology | Admitting: Emergency Medicine

## 2023-01-17 ENCOUNTER — Encounter (HOSPITAL_COMMUNITY): Payer: Self-pay | Admitting: Emergency Medicine

## 2023-01-17 ENCOUNTER — Emergency Department (HOSPITAL_COMMUNITY)
Admission: EM | Admit: 2023-01-17 | Discharge: 2023-01-17 | Disposition: A | Discharge status: Home / Self Care | Payer: Medicare HMO | Attending: Emergency Medicine | Admitting: Emergency Medicine

## 2023-01-17 VITALS — BP 117/73 | HR 82 | Temp 98.6°F | Resp 18

## 2023-01-17 DIAGNOSIS — N390 Urinary tract infection, site not specified: Secondary | ICD-10-CM | POA: Diagnosis not present

## 2023-01-17 DIAGNOSIS — Z79899 Other long term (current) drug therapy: Secondary | ICD-10-CM | POA: Insufficient documentation

## 2023-01-17 DIAGNOSIS — K5730 Diverticulosis of large intestine without perforation or abscess without bleeding: Secondary | ICD-10-CM | POA: Diagnosis not present

## 2023-01-17 DIAGNOSIS — L509 Urticaria, unspecified: Secondary | ICD-10-CM | POA: Diagnosis not present

## 2023-01-17 DIAGNOSIS — R1084 Generalized abdominal pain: Secondary | ICD-10-CM | POA: Diagnosis not present

## 2023-01-17 DIAGNOSIS — D72829 Elevated white blood cell count, unspecified: Secondary | ICD-10-CM | POA: Insufficient documentation

## 2023-01-17 DIAGNOSIS — N1330 Unspecified hydronephrosis: Secondary | ICD-10-CM | POA: Diagnosis not present

## 2023-01-17 DIAGNOSIS — R079 Chest pain, unspecified: Secondary | ICD-10-CM | POA: Insufficient documentation

## 2023-01-17 DIAGNOSIS — N132 Hydronephrosis with renal and ureteral calculous obstruction: Secondary | ICD-10-CM | POA: Insufficient documentation

## 2023-01-17 DIAGNOSIS — I700 Atherosclerosis of aorta: Secondary | ICD-10-CM | POA: Diagnosis not present

## 2023-01-17 DIAGNOSIS — I10 Essential (primary) hypertension: Secondary | ICD-10-CM | POA: Diagnosis not present

## 2023-01-17 DIAGNOSIS — K5080 Crohn's disease of both small and large intestine without complications: Secondary | ICD-10-CM | POA: Diagnosis not present

## 2023-01-17 DIAGNOSIS — T782XXA Anaphylactic shock, unspecified, initial encounter: Secondary | ICD-10-CM

## 2023-01-17 DIAGNOSIS — T451X5A Adverse effect of antineoplastic and immunosuppressive drugs, initial encounter: Secondary | ICD-10-CM

## 2023-01-17 DIAGNOSIS — T7840XA Allergy, unspecified, initial encounter: Secondary | ICD-10-CM | POA: Diagnosis not present

## 2023-01-17 DIAGNOSIS — N134 Hydroureter: Secondary | ICD-10-CM | POA: Diagnosis not present

## 2023-01-17 DIAGNOSIS — Z743 Need for continuous supervision: Secondary | ICD-10-CM | POA: Diagnosis not present

## 2023-01-17 DIAGNOSIS — R109 Unspecified abdominal pain: Secondary | ICD-10-CM | POA: Diagnosis not present

## 2023-01-17 LAB — CBC WITH DIFFERENTIAL/PLATELET
Abs Immature Granulocytes: 0.11 10*3/uL — ABNORMAL HIGH (ref 0.00–0.07)
Basophils Absolute: 0.1 10*3/uL (ref 0.0–0.1)
Basophils Relative: 0 %
Eosinophils Absolute: 0.1 10*3/uL (ref 0.0–0.5)
Eosinophils Relative: 0 %
HCT: 43 % (ref 36.0–46.0)
Hemoglobin: 13.5 g/dL (ref 12.0–15.0)
Immature Granulocytes: 1 %
Lymphocytes Relative: 10 %
Lymphs Abs: 1.5 10*3/uL (ref 0.7–4.0)
MCH: 28.9 pg (ref 26.0–34.0)
MCHC: 31.4 g/dL (ref 30.0–36.0)
MCV: 92.1 fL (ref 80.0–100.0)
Monocytes Absolute: 0.9 10*3/uL (ref 0.1–1.0)
Monocytes Relative: 6 %
Neutro Abs: 12.9 10*3/uL — ABNORMAL HIGH (ref 1.7–7.7)
Neutrophils Relative %: 83 %
Platelets: 223 10*3/uL (ref 150–400)
RBC: 4.67 MIL/uL (ref 3.87–5.11)
RDW: 13.2 % (ref 11.5–15.5)
WBC: 15.5 10*3/uL — ABNORMAL HIGH (ref 4.0–10.5)
nRBC: 0 % (ref 0.0–0.2)

## 2023-01-17 LAB — URINALYSIS, W/ REFLEX TO CULTURE (INFECTION SUSPECTED)
Bilirubin Urine: NEGATIVE
Glucose, UA: NEGATIVE mg/dL
Hgb urine dipstick: NEGATIVE
Ketones, ur: NEGATIVE mg/dL
Nitrite: NEGATIVE
Protein, ur: NEGATIVE mg/dL
Specific Gravity, Urine: 1.03 (ref 1.005–1.030)
pH: 5 (ref 5.0–8.0)

## 2023-01-17 LAB — COMPREHENSIVE METABOLIC PANEL
ALT: 15 U/L (ref 0–44)
AST: 30 U/L (ref 15–41)
Albumin: 4 g/dL (ref 3.5–5.0)
Alkaline Phosphatase: 92 U/L (ref 38–126)
Anion gap: 13 (ref 5–15)
BUN: 23 mg/dL (ref 8–23)
CO2: 18 mmol/L — ABNORMAL LOW (ref 22–32)
Calcium: 9.2 mg/dL (ref 8.9–10.3)
Chloride: 105 mmol/L (ref 98–111)
Creatinine, Ser: 1.04 mg/dL — ABNORMAL HIGH (ref 0.44–1.00)
GFR, Estimated: 59 mL/min — ABNORMAL LOW (ref >60)
Glucose, Bld: 213 mg/dL — ABNORMAL HIGH (ref 70–99)
Potassium: 3.7 mmol/L (ref 3.5–5.1)
Sodium: 136 mmol/L (ref 135–145)
Total Bilirubin: 0.8 mg/dL (ref 0.0–1.2)
Total Protein: 7.7 g/dL (ref 6.5–8.1)

## 2023-01-17 LAB — LIPASE, BLOOD: Lipase: 30 U/L (ref 11–51)

## 2023-01-17 LAB — CBG MONITORING, ED: Glucose-Capillary: 207 mg/dL — ABNORMAL HIGH (ref 70–99)

## 2023-01-17 LAB — TROPONIN I (HIGH SENSITIVITY)
Troponin I (High Sensitivity): 4 ng/L (ref <18)
Troponin I (High Sensitivity): 3 ng/L (ref <18)

## 2023-01-17 MED ORDER — DIPHENHYDRAMINE HCL 50 MG/ML IJ SOLN
50.0000 mg | Freq: Once | INTRAMUSCULAR | Status: AC | PRN
  Administered 2023-01-17: 50 mg via INTRAVENOUS

## 2023-01-17 MED ORDER — EPINEPHRINE 0.3 MG/0.3ML IJ SOAJ: 0.3000 mg | INTRAMUSCULAR | 0 refills | Status: AC | PRN

## 2023-01-17 MED ORDER — ALBUTEROL SULFATE HFA 108 (90 BASE) MCG/ACT IN AERS: 2.0000 | INHALATION_SPRAY | Freq: Once | RESPIRATORY_TRACT | Status: DC | PRN

## 2023-01-17 MED ORDER — CEPHALEXIN 500 MG PO CAPS: 500.0000 mg | ORAL_CAPSULE | Freq: Two times a day (BID) | ORAL | 0 refills | Status: AC

## 2023-01-17 MED ORDER — USTEKINUMAB 130 MG/26ML IV SOLN
390.0000 mg | Freq: Once | INTRAVENOUS | Status: AC
  Administered 2023-01-17: 390 mg via INTRAVENOUS
  Filled 2023-01-17: qty 78

## 2023-01-17 MED ORDER — SODIUM CHLORIDE 0.9 % IV SOLN: Freq: Once | INTRAVENOUS | Status: AC | PRN

## 2023-01-17 MED ORDER — METHYLPREDNISOLONE SODIUM SUCC 125 MG IJ SOLR
125.0000 mg | Freq: Once | INTRAMUSCULAR | Status: AC | PRN
  Administered 2023-01-17: 125 mg via INTRAVENOUS

## 2023-01-17 MED ORDER — FAMOTIDINE IN NACL 20-0.9 MG/50ML-% IV SOLN
20.0000 mg | Freq: Once | INTRAVENOUS | Status: AC | PRN
  Administered 2023-01-17 (×2): 20 mg via INTRAVENOUS

## 2023-01-17 MED ORDER — IOHEXOL 350 MG/ML SOLN
100.0000 mL | Freq: Once | INTRAVENOUS | Status: AC | PRN
  Administered 2023-01-17: 100 mL via INTRAVENOUS

## 2023-01-17 MED ORDER — EPINEPHRINE 0.3 MG/0.3ML IJ SOAJ
0.3000 mg | Freq: Once | INTRAMUSCULAR | Status: DC | PRN
  Administered 2023-01-17: 0.3 mg via INTRAMUSCULAR

## 2023-01-17 MED ORDER — FENTANYL CITRATE PF 50 MCG/ML IJ SOSY
50.0000 ug | PREFILLED_SYRINGE | Freq: Once | INTRAMUSCULAR | Status: AC
  Administered 2023-01-17: 50 ug via INTRAVENOUS
  Filled 2023-01-17: qty 1

## 2023-01-17 MED ORDER — CEPHALEXIN 500 MG PO CAPS
500.0000 mg | ORAL_CAPSULE | Freq: Once | ORAL | Status: AC
  Administered 2023-01-17: 500 mg via ORAL
  Filled 2023-01-17: qty 1

## 2023-01-17 MED ORDER — PREDNISONE 20 MG PO TABS: ORAL_TABLET | ORAL | 0 refills | Status: DC

## 2023-01-17 NOTE — ED Provider Notes (Signed)
 [facility] AT [facility] Provider Note   CSN: [id number] Arrival date & time: 01/17/23  1519     History  Chief Complaint  Patient presents with   Allergic Reaction    Desiree Hogan is a 68 y.o. female.  HPI 68 year old female presents with an allergic reaction.  She was at an infusion clinic getting Stelara  infusion for the first time.  During the infusion, she estimates around 2 PM, she developed hives, and abnormal throat sensation, nausea, and sweating.  She was given epinephrine , Pepcid , Solu-Medrol  and Benadryl .  She feels like she has mildly improved since this happened.  Still has some throat symptoms but feels like her voice is okay.  Feels a little short of breath.  Since arriving to the ED she feels like she has developed epigastric pain.  Home Medications Prior to Admission medications   Medication Sig Start Date End Date Taking? Authorizing Provider  alendronate (FOSAMAX) 70 MG tablet Take 70 mg by mouth every Friday. Take with a full glass of water  on an empty stomach.   Yes [provider]  BREO ELLIPTA  100-25 MCG/INH AEPB Inhale 1 puff into the lungs daily.  09/28/15  Yes [provider]  CALCIUM  GLUCONATE PO Take 600 mg by mouth 2 (two) times daily.    Yes [provider]  cephALEXin  (KEFLEX ) 500 MG capsule Take 1 capsule (500 mg total) by mouth 2 (two) times daily for 7 days. 01/17/23 01/24/23 Yes Freddi Hamilton, MD  cetirizine (ZYRTEC) 10 MG tablet Take 10 mg by mouth daily.   Yes [provider]  dicyclomine  (BENTYL ) 10 MG capsule TAKE 1 CAPSULE (10 MG TOTAL) BY MOUTH 3 (THREE) TIMES DAILY AS NEEDED FOR SPASMS. 02/18/21  Yes Rehman, Claudis PENNER, MD  DULoxetine  (CYMBALTA ) 30 MG capsule Take 1 capsule (30 mg total) by mouth daily. 12/15/22  Yes Cheryl Waddell CHRISTELLA, PA-C  EPINEPHrine  0.3 mg/0.3 mL IJ SOAJ injection Inject 0.3 mg into the muscle as needed for anaphylaxis. 01/17/23  Yes Freddi Hamilton, MD   gemfibrozil  (LOPID ) 600 MG tablet Take 600 mg by mouth 2 (two) times daily. 04/30/22  Yes [provider]  levothyroxine  (SYNTHROID , LEVOTHROID) 125 MCG tablet Take 125 mcg by mouth daily. 11/29/16  Yes [provider]  lisinopril (ZESTRIL) 2.5 MG tablet Take 2.5 mg by mouth daily. 09/24/19  Yes [provider]  Mesalamine  (ASACOL ) 400 MG CPDR DR capsule TAKE 2 CAPSULES (800 MG TOTAL) BY MOUTH 2 (TWO) TIMES DAILY. 10/18/22  Yes Carlan, Chelsea L, NP  predniSONE  (DELTASONE ) 20 MG tablet 2 tabs po daily x 4 days 01/17/23  Yes Freddi Hamilton, MD  pregabalin  (LYRICA ) 50 MG capsule Take 1 capsule (50 mg total) by mouth 2 (two) times daily. 12/16/22  Yes Debby Fidela CROME, NP  rosuvastatin  (CRESTOR ) 10 MG tablet Take 10 mg by mouth daily. 11/19/17  Yes [provider]  tiZANidine  (ZANAFLEX ) 4 MG tablet TAKE 1 TABLET BY MOUTH AT BEDTIME AS NEEDED FOR MUSCLE SPASMS. 11/28/22  Yes Cheryl Waddell CHRISTELLA, PA-C  traMADol  (ULTRAM ) 50 MG tablet Take 1 tablet (50 mg total) by mouth 3 (three) times daily as needed. Patient taking differently: Take 50 mg by mouth 3 (three) times daily as needed for moderate pain (pain score 4-6). 12/16/22  Yes Debby Fidela CROME, NP      Allergies    Bactrim [sulfamethoxazole-trimethoprim], Penicillins, Stelara  [ustekinumab ], Tetracyclines & related, and Ciprofloxacin     Review of Systems   Review  of Systems  Respiratory:  Positive for shortness of breath.   Cardiovascular:  Negative for chest pain.  Gastrointestinal:  Positive for abdominal pain and nausea. Negative for vomiting.  Skin:  Positive for rash.    Physical Exam Updated Vital Signs BP 128/63   Pulse 97   Temp (!) 96.3 F (35.7 C) (Axillary)   Resp (!) 24   Ht 5' 3 (1.6 m)   Wt 81 kg   SpO2 94%   BMI 31.63 kg/m  Physical Exam Vitals and nursing note reviewed.  Constitutional:      Appearance: She is well-developed. She is diaphoretic.  HENT:     Head: Normocephalic and  atraumatic.     Mouth/Throat:     Mouth: Mucous membranes are dry.     Pharynx: Oropharynx is clear.     Comments: No oropharyngeal swelling. No lip/facial swelling. Normal voice Cardiovascular:     Rate and Rhythm: Normal rate and regular rhythm.     Heart sounds: Normal heart sounds.  Pulmonary:     Effort: Pulmonary effort is normal.     Breath sounds: Normal breath sounds. No stridor.  Abdominal:     Palpations: Abdomen is soft.     Tenderness: There is abdominal tenderness.     Comments: Diffuse tenderness, worst in epigastrum  Skin:    General: Skin is warm.     Findings: Rash present.     Comments: Flat rash noticed to both forearms, right worse than left.  Neurological:     Mental Status: She is alert.     ED Results / Procedures / Treatments   Labs (all labs ordered are listed, but only abnormal results are displayed) Labs Reviewed  COMPREHENSIVE METABOLIC PANEL - Abnormal; Notable for the following components:      Result Value   CO2 18 (*)    Glucose, Bld 213 (*)    Creatinine, Ser 1.04 (*)    GFR, Estimated 59 (*)    All other components within normal limits  CBC WITH DIFFERENTIAL/PLATELET - Abnormal; Notable for the following components:   WBC 15.5 (*)    Neutro Abs 12.9 (*)    Abs Immature Granulocytes 0.11 (*)    All other components within normal limits  URINALYSIS, W/ REFLEX TO CULTURE (INFECTION SUSPECTED) - Abnormal; Notable for the following components:   Leukocytes,Ua TRACE (*)    Bacteria, UA MANY (*)    All other components within normal limits  CBG MONITORING, ED - Abnormal; Notable for the following components:   Glucose-Capillary 207 (*)    All other components within normal limits  LIPASE, BLOOD  TROPONIN I (HIGH SENSITIVITY)  TROPONIN I (HIGH SENSITIVITY)    EKG EKG Interpretation Date/Time:  Tuesday January 17 2023 15:58:59 EST Ventricular Rate:  81 PR Interval:  174 QRS Duration:  104 QT Interval:  440 QTC Calculation: 511 R  Axis:   -62  Text Interpretation: Sinus rhythm Left anterior fascicular block Abnormal R-wave progression, late transition Prolonged QT interval overall similar to  Nov 2024 Confirmed by Freddi Hamilton [phone]) on 01/17/2023 4:05:25 PM  Radiology CT Angio Chest/Abd/Pel for Dissection W and/or Wo Contrast Result Date: 01/17/2023 CLINICAL DATA:  Hives chest pain EXAM: CT ANGIOGRAPHY CHEST, ABDOMEN AND PELVIS TECHNIQUE: Non-contrast CT of the chest was initially obtained. Multidetector CT imaging through the chest, abdomen and pelvis was performed using the standard protocol during bolus administration of intravenous contrast. Multiplanar reconstructed images and MIPs were obtained and reviewed to evaluate  the vascular anatomy. RADIATION DOSE REDUCTION: This exam was performed according to the departmental dose-optimization program which includes automated exposure control, adjustment of the mA and/or kV according to patient size and/or use of iterative reconstruction technique. CONTRAST:  OMNIPAQUE  IOHEXOL  350 MG/ML SOLN COMPARISON:  CT 06/21/2021, 12/20/2022 FINDINGS: CTA CHEST FINDINGS Cardiovascular: Non contrasted images of the chest demonstrate no acute intramural hematoma. Moderate aortic atherosclerosis. No aneurysm. No dissection. Upper normal cardiac size. No pericardial effusion Mediastinum/Nodes: Patent trachea. No thyroid  mass. Borderline mediastinal lymph nodes, right paratracheal node measuring 10 mm. Small right hilar nodes measuring 12 mm. Esophagus within normal limits. Lungs/Pleura: Emphysema. No acute airspace disease, pleural effusion, or pneumothorax Musculoskeletal: No acute or suspicious osseous abnormality Review of the MIP images confirms the above findings. CTA ABDOMEN AND PELVIS FINDINGS VASCULAR Aorta: Normal caliber aorta without aneurysm, dissection, vasculitis or significant stenosis. Moderate aortic atherosclerosis. Celiac: Patent without evidence of aneurysm, dissection,  vasculitis or significant stenosis. SMA: Possible mild stenosis at the origin of the SMA. Negative for dissection, aneurysm or occlusion. Renals: Both renal arteries are patent without evidence of aneurysm, dissection, vasculitis, fibromuscular dysplasia or significant stenosis. IMA: Patent without evidence of aneurysm, dissection, vasculitis or significant stenosis. Inflow: Patent without evidence of aneurysm, dissection, vasculitis or significant stenosis. Veins: Suboptimally evaluated Review of the MIP images confirms the above findings. NON-VASCULAR Hepatobiliary: Cholecystectomy. Mildly prominent common bile duct measuring 11 mm. Pancreas: Unremarkable. No pancreatic ductal dilatation or surrounding inflammatory changes. Spleen: Normal in size without focal abnormality. Adrenals/Urinary Tract: Adrenal glands are within normal limits. Minimal right hydronephrosis and hydroureter. No obstructing stone. Bladder is normal. Stomach/Bowel: Stomach is within normal limits. Appendix appears normal. No evidence of bowel wall thickening, distention, or inflammatory changes. Disease of the sigmoid colon without acute inflammation Lymphatic: No suspicious lymph nodes Reproductive: Uterus and bilateral adnexa are unremarkable. Other: No free air.  Small free fluid in the pelvis. Musculoskeletal: Posterior fusion hardware L3 through L5. No acute osseous abnormality Review of the MIP images confirms the above findings. IMPRESSION: 1. Negative for acute aortic dissection or aneurysm. 2. Emphysema. 3. Minimal right hydronephrosis and hydroureter without obstructing stone. 4. Small free fluid in the pelvis. 5. 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 Electronically Signed   By: Luke Bun M.D.   On: 01/17/2023 18:07   DG Chest Portable 1 View Result Date: 01/17/2023 CLINICAL DATA:  Chest and abdominal pain. EXAM: PORTABLE CHEST  1 VIEW COMPARISON:  Chest radiograph dated 12/26/2019. FINDINGS: Mild chronic interstitial coarsening. No focal consolidation, pleural effusion, or pneumothorax. The cardiac silhouette. Atherosclerotic calcification of the aorta. No acute osseous pathology. IMPRESSION: No active disease. Electronically Signed   By: Vanetta Chou M.D.   On: 01/17/2023 16:10    Procedures Procedures    Medications Ordered in ED Medications  fentaNYL  (SUBLIMAZE ) injection 50 mcg (50 mcg Intravenous Given 01/17/23 1617)  iohexol  (OMNIPAQUE ) 350 MG/ML injection 100 mL (100 mLs Intravenous Contrast Given 01/17/23 1727)  cephALEXin  (KEFLEX ) capsule 500 mg (500 mg Oral Given 01/17/23 2122)    ED Course/ Medical Decision Making/ A&P                                 Medical Decision Making Amount and/or Complexity of Data Reviewed Labs: ordered.    Details: Leukocytosis, likely reactive.  Normal troponins x 2.  Questionable UTI. Radiology: ordered and  independent interpretation performed.    Details: No dissection ECG/medicine tests: ordered and independent interpretation performed.    Details: No ischemia  Risk Prescription drug management.   Patient presents with what sounds like anaphylaxis.  Has slowly been improving since treatment at the infusion center and continues to improve while in the ED.  She did have some pretty severe abdominal pain, I wonder if this was related to either the treatment of anaphylaxis or a sign of anaphylaxis.  However this has now resolved.  She is having a little bit of lower abdominal cramping and when asked, indicates some dysuria over the last few days.  I think her urinalysis likely indicates a UTI.  She has some allergies but tells me she has had Keflex  in the past without issue and given that there is some mild right hydroureter and hydronephrosis without a stone, I think Keflex  would be better than Macrobid given this could be in a sending infection.  However right now she  feels well and looks well and is stable for discharge.  Will treat her with steroids for the next few days as well given the anaphylactic reaction and discharged with EpiPen  prescription.  Given return precautions.          Final Clinical Impression(s) / ED Diagnoses Final diagnoses:  Anaphylaxis, initial encounter  Acute UTI  Hydronephrosis of right kidney    Rx / DC Orders ED Discharge Orders          Ordered    predniSONE  (DELTASONE ) 20 MG tablet        01/17/23 2105    cephALEXin  (KEFLEX ) 500 MG capsule  2 times daily        01/17/23 2105    EPINEPHrine  0.3 mg/0.3 mL IJ SOAJ injection  As needed        01/17/23 2105              Freddi Hamilton, MD 01/17/23 2353

## 2023-01-17 NOTE — ED Triage Notes (Signed)
 Pt was getting stelara infusion for the first time developing hives. Clinic gave her epi, pepcid, and solu medrol and benadryl.

## 2023-01-17 NOTE — Discharge Instructions (Signed)
 We are treating you for urinary tract infection with antibiotics, Keflex .  The CT scan says that you have some mild swelling to your right kidney and right ureter, you need to follow-up with your primary care physician for this.  We are giving you a prescription for an EpiPen  due to your allergic reaction/anaphylaxis today.  You are also being started on prednisone  for the next 4 days, start the prescription tomorrow.  If you develop new or worsening symptoms including throat closing symptoms, rash, trouble breathing, pain, vomiting, or any other new/concerning symptoms then return to the ER or call 911.

## 2023-01-17 NOTE — Progress Notes (Signed)
 1445- pt was in bathroom (checked on pt multiple times and states that she is doing okay) with IV out when we checked on her the last time.   Pt receiving Stelara  390 mg, first dose. Pt diaphoretic, nauseous, flushed, bilateral rash on arms, and dizzy.  Pt back to room, vital signs taken. 1450- IV restarted. 22G in right antecubital.  NS 1L bolus started at 999 mL/hr.  Benadryl  50 mg IV given. 1451-solumedrol 125 mg IV given. 1453-pepcid  20 mg IVPB started. EMS called. 1458-epinephrine  IM 0.3 mg given right thigh. 1500-Dr Castaneda notified. Vital signs retaken. 1510- EMS and Dr Eartha at bedside, pt transferred to emergency dept. 1516-notified emergency room that pt is coming.

## 2023-01-18 NOTE — Telephone Encounter (Signed)
 FYI: ED note from 01/17/2023:  68 year old female presents with an allergic reaction. She was at an infusion clinic getting Stelara  infusion for the first time. During the infusion, she estimates around 2 PM, she developed hives, and abnormal throat sensation, nausea, and sweating. She was given epinephrine , Pepcid , Solu-Medrol  and Benadryl . She feels like she has mildly improved since this happened. Still has some throat symptoms but feels like her voice is okay. Feels a little short of breath. Since arriving to the ED she feels like she has developed epigastric pain.

## 2023-01-18 NOTE — Telephone Encounter (Signed)
 Thanks. Patient was discharged home with oral prednisone  and EpiPen .  Her symptoms had subsided at time she went to the ER, although she was complaining of some abdominal pain. I tried calling her to her listed phone numbers, also tried calling her husband but there was no answer.  I left a detailed voice message asking them to call back to give an update about her status.  Crystal, please stop the authorization process for Stelara  as this medication caused anaphylaxis.  We will need to discuss other treatment options in an earlier appointment (possibly Entyvio  or Rinvoq).  Mitzie, can you please call her and move her appointment for an earlier date -hopefully within the next 2 to 3 weeks?  Thanks,  [name], MD Gastroenterology and Hepatology [facility]

## 2023-01-18 NOTE — Telephone Encounter (Signed)
 I called and left message on patient vm, I asked that she please return call to the office.

## 2023-01-18 NOTE — Telephone Encounter (Signed)
 I spoke with the patient and she states she is itchy and her crohn's is acting up, she says she has stomach cramping and diarrhea, she has had four loose bm's today, denies any fever or any other issues. The next available appointment is scheduled for 02/09/2023 at 8:30 am, patient made aware of this appointment date and time.

## 2023-01-19 NOTE — Telephone Encounter (Signed)
Thanks, I called the patient to discuss her symptoms again but she did not answer.  Left detailed voice message asking her to give [place] an update of her symptoms and to confirm that she has a follow-up appointment with [place] on 02/09/2023.

## 2023-01-31 NOTE — Telephone Encounter (Signed)
I called and left a message asked that the patient please return call to the office.

## 2023-02-03 NOTE — Telephone Encounter (Signed)
Patient never returned the call to the office, but previous message the patient was made aware of her up coming appointment date and time.

## 2023-02-06 NOTE — Telephone Encounter (Signed)
 Thanks

## 2023-02-09 ENCOUNTER — Telehealth (INDEPENDENT_AMBULATORY_CARE_PROVIDER_SITE_OTHER): Payer: Self-pay

## 2023-02-09 ENCOUNTER — Encounter (INDEPENDENT_AMBULATORY_CARE_PROVIDER_SITE_OTHER): Payer: Self-pay | Admitting: Gastroenterology

## 2023-02-09 ENCOUNTER — Ambulatory Visit (INDEPENDENT_AMBULATORY_CARE_PROVIDER_SITE_OTHER): Payer: Medicare HMO | Admitting: Gastroenterology

## 2023-02-09 VITALS — BP 121/65 | HR 97 | Temp 98.1°F | Ht 63.0 in | Wt 183.1 lb

## 2023-02-09 DIAGNOSIS — K5080 Crohn's disease of both small and large intestine without complications: Secondary | ICD-10-CM

## 2023-02-09 NOTE — Telephone Encounter (Signed)
 Start Entyvio  per Dr. Sammi Crick

## 2023-02-09 NOTE — Patient Instructions (Addendum)
 Start authorization process for vedolizumab  induction and maintenance doses

## 2023-02-09 NOTE — Progress Notes (Signed)
 Toribio Fortune, M.D. Gastroenterology & Hepatology [facility] [address] [place], [place] [address]  Primary Care Physician: Rosamond Leta NOVAK, MD [address] [place] [address]  I will communicate my assessment and recommendations to the referring MD via EMR.  Problems: Crohn's ileocolitis Possible anaphylactic reaction to Stelara    History of Present Illness: Desiree Hogan is a 68 y.o. female with pmh of Crohn's disease, anemia, COPD, DM, fibromyalgia, HLD, Hypothyroidism, IBS, PVD,  who presents for follow up of Crohn's disease.  The patient was last seen on 01/05/2023. At that time, the patient was started on Stelara  for moderate Crohn's disease.  Labs were checked on 01/05/2023.  CBC, CMP, CRP, vitamin D , hepatitis B surface antigen and TB testing were all normal.  The patient went to the infusion center to receive her first dose of Stelara  on 01/17/2023.  While she was receiving the infusion, the patient reported hives, shortness of breath, abnormal throat sensation, nausea and diaphoresis.  She also had worsening pain and some diarrhea.  The patient received Benadryl , Solu-Medrol , Pepcid  and epinephrine  per protocol in the infusion center.  I evaluated the patient in the infusion center and the patient was brought by EMS to the ER at [facility].  She was considered to have an anaphylactic reaction. Patient was given a 5 day prescription for 4 days (40 mg qday).  State she is feeling well and denies any complaints. She is having one BM per day without blood in stool or diarrhea. She states that after been on prednisone  her symptoms completely resolved. The patient denies having any nausea, vomiting, fever, chills, hematochezia, melena, hematemesis, abdominal distention, abdominal pain, diarrhea, jaundice, pruritus or weight loss.  Last flu shot:2024 Last pneumonia shot:never Last Pap smear: advised to stop by PCP Last evaluation by  dermatology: never Last zoster vaccine: 1 dose - 3 years Last DEXA scan: last year per patient, had osteopenia - following with PCP COVID-19 shot: none   Last Colonoscopy: 11/11/2022 Terminal ileum with few erosions, few erosions in the ascending colon, 4 mm polyp in the ascending colon, 1 mm polyp in the ascending colon, congested mucosa in the proximal transverse colon, presence of a 10 to 15 mm scar in the proximal transverse colon, diverticulosis.   A. COLON, PROXIMAL TRANSVERSE SCAR, BIOPSY:      Colonic mucosa with focal mild crypt architectural disarray.      Negative for activity, granuloma, dysplasia or malignancy.  B. SMALL BOWEL, BIOPSY:      Active ileitis with crypt architectural disarray.      Negative for granuloma, dysplasia or malignancy.  C. COLON, ASCENDING, POLYPECTOMY:      Polypoid low-grade dysplasia.      Background colonic mucosa with active colitis and focal mild crypt architectural disarray.      Negative for granuloma, dysplasia or malignancy.  D. COLON, CECUM, ASCENDING, BIOPSY:      Colonic mucosa with active colitis and focal mild crypt architectural disarray.      Negative for granuloma, dysplasia or malignancy.  E. COLON, TRANSVERSE, BIOPSY:      Colonic mucosa with focal mild crypt architectural disarray.      Negative for activity, granuloma, dysplasia or malignancy   Past Medical History: Past Medical History:  Diagnosis Date   Abdominal pain    Anemia    Back pain    Colitis    COPD (chronic obstructive pulmonary disease) (HCC)    not on home  o2   Crohn's disease (HCC) 03/29/12   Diabetes mellitus without complication (HCC)    Fibromyalgia    H/O breast biopsy    Hip pain    Hyperlipidemia    Hypothyroidism    Irritable bowel syndrome    Joint pain    Nonspecific abnormal finding in stool contents    Peripheral vascular disease (HCC)    Ulcer    Mouth    Past Surgical History: Past Surgical History:  Procedure Laterality Date    BIOPSY  12/20/2017   Procedure: BIOPSY;  Surgeon: Golda Claudis PENNER, MD;  [facility];  Service: Endoscopy;;  cecal erosions   BIOPSY  11/11/2022   Procedure: BIOPSY;  Surgeon: Eartha Angelia Sieving, MD;  [facility];  Service: Gastroenterology;;   CESAREAN SECTION  06/24/82   CHOLECYSTECTOMY  1993   Gall Bladder   COLONOSCOPY  Jan. 31, 2014   COLONOSCOPY N/A 12/20/2017   Procedure: COLONOSCOPY;  Surgeon: Golda Claudis PENNER, MD;  [facility];  Service: Endoscopy;  Laterality: N/A;  2:25   COLONOSCOPY WITH PROPOFOL  N/A 11/11/2022   Procedure: COLONOSCOPY WITH PROPOFOL ;  Surgeon: Eartha Angelia Sieving, MD;  [facility];  Service: Gastroenterology;  Laterality: N/A;  9:30AM;ASA 3   POLYPECTOMY  11/11/2022   Procedure: POLYPECTOMY;  Surgeon: Eartha Angelia, Sieving, MD;  [facility];  Service: Gastroenterology;;   SPINE SURGERY  2010 and 2011   TONSILLECTOMY      Family History: Family History  Problem Relation Age of Onset   Cancer Mother        Colon or vaginal ?   Deep vein thrombosis Mother    Hypertension Father    Diabetes Father    Heart disease Father        Heart Disease before age 43   Diabetes Sister    Diabetes Brother    Hypertension Brother    Healthy Daughter    Healthy Daughter    Healthy Daughter    Healthy Son    Breast cancer Neg Hx     Social History: Social History   Tobacco Use  Smoking Status Former   Current packs/day: 0.00   Average packs/day: 0.8 packs/day for 20.0 years (15.0 ttl pk-yrs)   Types: Cigarettes   Start date: 01/03/1989   Quit date: 01/03/2009   Years since quitting: 14.1   Passive exposure: Never  Smokeless Tobacco Never   Social History   Substance and Sexual Activity  Alcohol  Use No   Social History   Substance and Sexual Activity  Drug Use No    Allergies: Allergies  Allergen Reactions   Bactrim [Sulfamethoxazole-Trimethoprim] Itching    And sores    Penicillins Anaphylaxis    Immediate rash, facial/tongue/throat swelling, SOB or lightheadedness with hypotension Severe rash involving mucus membranes or skin necrosis    Stelara  [Ustekinumab ] Nausea Only and Rash    Sweating, flushing   Tetracyclines & Related Swelling and Rash   Ciprofloxacin      Blisters and facial swelling    Medications: Current Outpatient Medications  Medication Sig Dispense Refill   alendronate (FOSAMAX) 70 MG tablet Take 70 mg by mouth every Friday. Take with a full glass of water  on an empty stomach.     BREO ELLIPTA  100-25 MCG/INH AEPB Inhale 1 puff into the lungs daily.      CALCIUM  GLUCONATE PO Take 600 mg by mouth 2 (two) times daily.      cetirizine (ZYRTEC)  10 MG tablet Take 10 mg by mouth daily.     dicyclomine  (BENTYL ) 10 MG capsule TAKE 1 CAPSULE (10 MG TOTAL) BY MOUTH 3 (THREE) TIMES DAILY AS NEEDED FOR SPASMS. 270 capsule 1   DULoxetine  (CYMBALTA ) 30 MG capsule Take 1 capsule (30 mg total) by mouth daily. 90 capsule 0   EPINEPHrine  0.3 mg/0.3 mL IJ SOAJ injection Inject 0.3 mg into the muscle as needed for anaphylaxis. 1 each 0   gemfibrozil  (LOPID ) 600 MG tablet Take 600 mg by mouth 2 (two) times daily.     levothyroxine  (SYNTHROID , LEVOTHROID) 125 MCG tablet Take 125 mcg by mouth daily.  3   lisinopril (ZESTRIL) 2.5 MG tablet Take 2.5 mg by mouth daily.     pregabalin  (LYRICA ) 50 MG capsule Take 1 capsule (50 mg total) by mouth 2 (two) times daily. 60 capsule 5   rosuvastatin  (CRESTOR ) 10 MG tablet Take 10 mg by mouth daily.  3   tiZANidine  (ZANAFLEX ) 4 MG tablet TAKE 1 TABLET BY MOUTH AT BEDTIME AS NEEDED FOR MUSCLE SPASMS. 90 tablet 0   traMADol  (ULTRAM ) 50 MG tablet Take 1 tablet (50 mg total) by mouth 3 (three) times daily as needed. (Patient taking differently: Take 50 mg by mouth 3 (three) times daily as needed for moderate pain (pain score 4-6).) 90 tablet 5   No current facility-administered medications for this visit.    Review of  Systems: GENERAL: negative for malaise, night sweats HEENT: No changes in hearing or vision, no nose bleeds or other nasal problems. NECK: Negative for lumps, goiter, pain and significant neck swelling RESPIRATORY: Negative for cough, wheezing CARDIOVASCULAR: Negative for chest pain, leg swelling, palpitations, orthopnea GI: SEE HPI MUSCULOSKELETAL: Negative for joint pain or swelling, back pain, and muscle pain. SKIN: Negative for lesions, rash PSYCH: Negative for sleep disturbance, mood disorder and recent psychosocial stressors. HEMATOLOGY Negative for prolonged bleeding, bruising easily, and swollen nodes. ENDOCRINE: Negative for cold or heat intolerance, polyuria, polydipsia and goiter. NEURO: negative for tremor, gait imbalance, syncope and seizures. The remainder of the review of systems is noncontributory.   Physical Exam: BP 121/65 (BP Location: Left Arm, Patient Position: Sitting, Cuff Size: Normal)   Pulse 97   Temp 98.1 F (36.7 C) (Temporal)   Ht 5' 3 (1.6 m)   Wt 183 lb 1.6 oz (83.1 kg)   BMI 32.43 kg/m  GENERAL: The patient is AO x3, in no acute distress. HEENT: Head is normocephalic and atraumatic. EOMI are intact. Mouth is well hydrated and without lesions. NECK: Supple. No masses LUNGS: Clear to auscultation. No presence of rhonchi/wheezing/rales. Adequate chest expansion HEART: RRR, normal s1 and s2. ABDOMEN: Soft, nontender, no guarding, no peritoneal signs, and nondistended. BS +. No masses. EXTREMITIES: Without any cyanosis, clubbing, rash, lesions or edema. NEUROLOGIC: AOx3, no focal motor deficit. SKIN: no jaundice, no rashes  Imaging/Labs: as above  I personally reviewed and interpreted the available labs, imaging and endoscopic files.  Impression and Plan: Desiree Hogan is a 68 y.o. female with pmh of Crohn's disease, anemia, COPD, DM, fibromyalgia, HLD, Hypothyroidism, IBS, PVD,  who presents for follow up of Crohn's disease.  Patient had  endoscopic evidence of moderately active Crohn's disease.  Due to this, she was started on Stelara , but unfortunately she developed an anaphylactic reaction to this medication that required acute management in the infusion center and in the ER.  Fortunately, her symptoms resolved after receiving treatment.  Notably, her gastrointestinal symptoms have significantly improved  after receiving short prednisone  course.  However, MANJOT BEUMER will require treatment with steroid-sparing medications.  Very extensive discussion was held regarding benefits and side effects of immunomodulators/biologicals (including infections, malignancies such as lymphoma, skin cancer, tolerance to medication), as well as need to control her disease clinically and endoscopically (ideally microscopically as well) to avoid recurrence of her symptomatology.  As she would like to avoid medications from the same class of medication that led to anaphylaxis, we discussed the possibility of trying an anti-TNF versus vedolizumab .  The patient will like to try vedolizumab  up for now.  -Start authorization process for vedolizumab  induction and maintenance doses -Updated allergy list, listing ustekinumab  as medication leading to anaphylaxis.  All questions were answered.      Toribio Fortune, MD Gastroenterology and Hepatology [facility]astroenterology

## 2023-02-10 ENCOUNTER — Other Ambulatory Visit: Payer: Self-pay | Admitting: Physician Assistant

## 2023-02-13 NOTE — Telephone Encounter (Signed)
 Patient is scheduled for the first infusion on 02/22/2023 at 10:30 am at [facility]

## 2023-02-22 ENCOUNTER — Ambulatory Visit: Payer: Medicare HMO

## 2023-02-24 ENCOUNTER — Ambulatory Visit: Payer: Medicare HMO

## 2023-03-01 ENCOUNTER — Ambulatory Visit: Payer: Medicare HMO

## 2023-03-01 ENCOUNTER — Encounter: Payer: Medicare HMO | Attending: Gastroenterology | Admitting: Internal Medicine

## 2023-03-01 VITALS — BP 135/69 | HR 90 | Temp 97.9°F | Resp 16

## 2023-03-01 DIAGNOSIS — K5080 Crohn's disease of both small and large intestine without complications: Secondary | ICD-10-CM | POA: Insufficient documentation

## 2023-03-01 MED ORDER — VEDOLIZUMAB 300 MG IV SOLR
300.0000 mg | Freq: Once | INTRAVENOUS | Status: AC
  Administered 2023-03-01: 300 mg via INTRAVENOUS
  Filled 2023-03-01: qty 5

## 2023-03-01 MED ORDER — METHYLPREDNISOLONE SODIUM SUCC 125 MG IJ SOLR
125.0000 mg | Freq: Once | INTRAMUSCULAR | Status: AC
  Administered 2023-03-01: 125 mg via INTRAVENOUS

## 2023-03-01 MED ORDER — ACETAMINOPHEN 325 MG PO TABS
650.0000 mg | ORAL_TABLET | Freq: Once | ORAL | Status: AC
  Administered 2023-03-01: 650 mg via ORAL

## 2023-03-01 MED ORDER — DIPHENHYDRAMINE HCL 50 MG/ML IJ SOLN
50.0000 mg | Freq: Once | INTRAMUSCULAR | Status: AC
  Administered 2023-03-01: 50 mg via INTRAVENOUS

## 2023-03-01 NOTE — Progress Notes (Signed)
 Diagnosis: Crohn's Disease  Provider:  Katrinka Blazing MD  Procedure: IV Infusion  IV Type: Peripheral, IV Location: R Antecubital  Entyvio (Vedolizumab), Dose: 300 mg  Infusion Start Time: 1355  Infusion Stop Time: 1430  Post Infusion IV Care: Observation period completed  Discharge: Condition: Good, Destination: Home . AVS Provided  Performed by:  Cleotilde Neer, LPN

## 2023-03-14 ENCOUNTER — Other Ambulatory Visit: Payer: Self-pay | Admitting: Physician Assistant

## 2023-03-14 NOTE — Telephone Encounter (Signed)
 Last Fill: 11/28/2022  Next Visit: 06/15/2023  Last Visit: 12/15/2022  Dx:  Fibromyalgia   Current Dose per office note on 12/15/2022: tizanidine 4 mg at bedtime as needed for muscle spasms   Okay to refill Tizanidine?

## 2023-03-15 ENCOUNTER — Encounter: Payer: Medicare HMO | Attending: Gastroenterology | Admitting: Internal Medicine

## 2023-03-15 VITALS — BP 114/62 | HR 93 | Temp 98.1°F | Resp 16

## 2023-03-15 DIAGNOSIS — K5080 Crohn's disease of both small and large intestine without complications: Secondary | ICD-10-CM | POA: Diagnosis not present

## 2023-03-15 MED ORDER — METHYLPREDNISOLONE SODIUM SUCC 125 MG IJ SOLR: 125.0000 mg | Freq: Once | INTRAMUSCULAR | Status: DC

## 2023-03-15 MED ORDER — DIPHENHYDRAMINE HCL 50 MG/ML IJ SOLN
50.0000 mg | Freq: Once | INTRAMUSCULAR | Status: AC
  Administered 2023-03-15: 50 mg via INTRAVENOUS

## 2023-03-15 MED ORDER — VEDOLIZUMAB 300 MG IV SOLR
300.0000 mg | Freq: Once | INTRAVENOUS | Status: AC
  Administered 2023-03-15: 300 mg via INTRAVENOUS
  Filled 2023-03-15: qty 5

## 2023-03-15 MED ORDER — ACETAMINOPHEN 325 MG PO TABS
650.0000 mg | ORAL_TABLET | Freq: Once | ORAL | Status: AC
  Administered 2023-03-15: 650 mg via ORAL

## 2023-03-15 NOTE — Progress Notes (Signed)
 Diagnosis: Crohn's Disease  Provider:  Katrinka Blazing MD  Procedure: IV Infusion  IV Type: Peripheral, IV Location: R Antecubital  Entyvio (Vedolizumab), Dose: 300 mg  Infusion Start Time: 1328  Infusion Stop Time: 1405  Post Infusion IV Care: Observation period completed  Discharge: Condition: Good, Destination: Home . AVS Provided  Performed by:  Cleotilde Neer, LPN

## 2023-04-06 ENCOUNTER — Ambulatory Visit (INDEPENDENT_AMBULATORY_CARE_PROVIDER_SITE_OTHER): Payer: Medicare HMO | Admitting: Gastroenterology

## 2023-04-20 ENCOUNTER — Encounter (INDEPENDENT_AMBULATORY_CARE_PROVIDER_SITE_OTHER): Payer: Self-pay | Admitting: Gastroenterology

## 2023-04-26 ENCOUNTER — Other Ambulatory Visit: Payer: Self-pay | Admitting: Physician Assistant

## 2023-04-26 NOTE — Telephone Encounter (Signed)
 Last Fill: 12/15/2022  Next Visit: 06/15/2023  Last Visit: 12/15/2022  Dx: Fibromyalgia   Current Dose per office note on 12/15/2022: DULoxetine  (CYMBALTA ) Take 1 capsule (30 mg total) by mouth daily.   Okay to refill Cymbalta ?

## 2023-05-08 ENCOUNTER — Telehealth: Payer: Self-pay

## 2023-05-08 NOTE — Telephone Encounter (Signed)
 Auth Submission: APPROVED Site of care: Site of care: [facility] Payer: [employer] Medication & CPT/J Code(s) submitted: Entyvio  (Vedolizumab ) J3380 Route of submission (phone, fax, portal): portal Phone # Fax # Auth type: Buy/Bill PB Units/visits requested: 300mg , q4 week x1 dose then q8weeks Reference number: [id number] Approval from: 05/04/23 to 05/03/24

## 2023-05-10 ENCOUNTER — Encounter: Attending: Gastroenterology | Admitting: *Deleted

## 2023-05-10 VITALS — BP 138/66 | HR 93 | Temp 98.1°F | Resp 16

## 2023-05-10 DIAGNOSIS — K5080 Crohn's disease of both small and large intestine without complications: Secondary | ICD-10-CM | POA: Diagnosis not present

## 2023-05-10 MED ORDER — ACETAMINOPHEN 325 MG PO TABS
650.0000 mg | ORAL_TABLET | Freq: Once | ORAL | Status: AC
  Administered 2023-05-10: 650 mg via ORAL

## 2023-05-10 MED ORDER — METHYLPREDNISOLONE SODIUM SUCC 125 MG IJ SOLR
125.0000 mg | Freq: Once | INTRAMUSCULAR | Status: AC
Start: 2023-05-10 — End: 2023-05-10
  Administered 2023-05-10: 125 mg via INTRAVENOUS

## 2023-05-10 MED ORDER — DIPHENHYDRAMINE HCL 50 MG/ML IJ SOLN
50.0000 mg | Freq: Once | INTRAMUSCULAR | Status: AC
  Administered 2023-05-10: 50 mg via INTRAVENOUS

## 2023-05-10 MED ORDER — VEDOLIZUMAB 300 MG IV SOLR
300.0000 mg | Freq: Once | INTRAVENOUS | Status: AC
  Administered 2023-05-10: 300 mg via INTRAVENOUS
  Filled 2023-05-10: qty 5

## 2023-05-10 NOTE — Progress Notes (Signed)
 Diagnosis: Crohn's Disease  Provider:  [name] MD  Procedure: IV Infusion  IV Type: Peripheral, IV Location: R Antecubital  Entyvio  (Vedolizumab ), Dose: 300 mg  Infusion Start Time: 1329  Infusion Stop Time: 1405  Post Infusion IV Care: Observation period completed  Discharge: Condition: Good, Destination: Home . AVS Provided  Performed by:  Verneda Golder, RN

## 2023-05-26 ENCOUNTER — Other Ambulatory Visit: Payer: Self-pay | Admitting: Physician Assistant

## 2023-05-26 ENCOUNTER — Other Ambulatory Visit: Payer: Self-pay | Admitting: Rheumatology

## 2023-05-26 NOTE — Telephone Encounter (Signed)
 Last Fill: 03/14/2023  Next Visit: 06/15/2023  Last Visit: 12/15/2022  Dx: Fibromyalgia   Current Dose per office note on 12/15/2022: tizanidine  4 mg at bedtime as needed   Okay to refill Tizanidine ?

## 2023-06-01 NOTE — Progress Notes (Deleted)
 Office Visit Note  Patient: Desiree Hogan             Date of Birth: 1955-02-28           MRN: [id number]             PCP: Orlena Bitters, MD Referring: Orlena Bitters, MD Visit Date: 06/15/2023 Occupation: @GUAROCC @  Subjective:  No chief complaint on file.   History of Present Illness: Desiree Hogan is a 68 y.o. female ***     Activities of Daily Living:  Patient reports morning stiffness for *** {minute/hour:19697}.   Patient {ACTIONS;DENIES/REPORTS:21021675::"Denies"} nocturnal pain.  Difficulty dressing/grooming: {ACTIONS;DENIES/REPORTS:21021675::"Denies"} Difficulty climbing stairs: {ACTIONS;DENIES/REPORTS:21021675::"Denies"} Difficulty getting out of chair: {ACTIONS;DENIES/REPORTS:21021675::"Denies"} Difficulty using hands for taps, buttons, cutlery, and/or writing: {ACTIONS;DENIES/REPORTS:21021675::"Denies"}  No Rheumatology ROS completed.   PMFS History:  Patient Active Problem List   Diagnosis Date Noted   Crohn's disease of both small and large intestine (HCC) 01/05/2023   Elevated fecal calprotectin 12/06/2021   Nausea without vomiting 10/20/2020   Abdominal pain 07/17/2020   Diarrhea 06/23/2020   Diverticulitis of colon 10/11/2017   Fibromyalgia 07/14/2017   ANA positive 07/14/2017   Primary osteoarthritis of both knees 07/14/2017   DDD (degenerative disc disease), lumbar 07/14/2017   Hypermobility of joint 07/14/2017   Age-related osteoporosis without current pathological fracture 07/14/2017   Other insomnia 07/14/2017   History of Crohn's disease 07/14/2017   History of IBS 07/14/2017   History of hypercholesterolemia 07/14/2017   History of COPD 07/14/2017   History of cholecystectomy 07/14/2017   Community acquired pneumonia 12/13/2016   Pneumonia 12/13/2016   Leukocytosis 12/13/2016   Hypokalemia 12/13/2016   Diabetes mellitus without complication (HCC) 12/13/2016    Past Medical History:  Diagnosis Date   Abdominal pain    Anemia    Back  pain    Colitis    COPD (chronic obstructive pulmonary disease) (HCC)    not on home o2   Crohn's disease (HCC) 03/29/12   Diabetes mellitus without complication (HCC)    Fibromyalgia    H/O breast biopsy    Hip pain    Hyperlipidemia    Hypothyroidism    Irritable bowel syndrome    Joint pain    Nonspecific abnormal finding in stool contents    Peripheral vascular disease (HCC)    Ulcer    Mouth    Family History  Problem Relation Age of Onset   Cancer Mother        Colon or vaginal ?   Deep vein thrombosis Mother    Hypertension Father    Diabetes Father    Heart disease Father        Heart Disease before age 74   Diabetes Sister    Diabetes Brother    Hypertension Brother    Healthy Daughter    Healthy Daughter    Healthy Daughter    Healthy Son    Breast cancer Neg Hx    Past Surgical History:  Procedure Laterality Date   BIOPSY  12/20/2017   Procedure: BIOPSY;  Surgeon: Ruby Corporal, MD;  [facility];  Service: Endoscopy;;  cecal erosions   BIOPSY  11/11/2022   Procedure: BIOPSY;  Surgeon: Urban Garden, MD;  [facility];  Service: Gastroenterology;;   CESAREAN SECTION  06/24/82   CHOLECYSTECTOMY  1993   Gall Bladder   COLONOSCOPY  Jan. 31, 2014   COLONOSCOPY N/A 12/20/2017   Procedure: COLONOSCOPY;  Surgeon:  Ruby Corporal, MD;  [facility];  Service: Endoscopy;  Laterality: N/A;  2:25   COLONOSCOPY WITH PROPOFOL  N/A 11/11/2022   Procedure: COLONOSCOPY WITH PROPOFOL ;  Surgeon: Urban Garden, MD;  [facility];  Service: Gastroenterology;  Laterality: N/A;  9:30AM;ASA 3   POLYPECTOMY  11/11/2022   Procedure: POLYPECTOMY;  Surgeon: Urban Garden, MD;  [facility];  Service: Gastroenterology;;   SPINE SURGERY  2010 and 2011   TONSILLECTOMY     Social History   Social History Narrative   Not on file    There is no immunization history on file for this patient.    Objective: Vital Signs: There were no vitals taken for this visit.   Physical Exam   Musculoskeletal Exam: ***  CDAI Exam: CDAI Score: -- Patient Global: --; Provider Global: -- Swollen: --; Tender: -- Joint Exam 06/15/2023   No joint exam has been documented for this visit   There is currently no information documented on the homunculus. Go to the Rheumatology activity and complete the homunculus joint exam.  Investigation: No additional findings.  Imaging: No results found.  Recent Labs: Lab Results  Component Value Date   WBC 15.5 (H) 01/17/2023   HGB 13.5 01/17/2023   PLT 223 01/17/2023   NA 136 01/17/2023   K 3.7 01/17/2023   CL 105 01/17/2023   CO2 18 (L) 01/17/2023   GLUCOSE 213 (H) 01/17/2023   BUN 23 01/17/2023   CREATININE 1.04 (H) 01/17/2023   BILITOT 0.8 01/17/2023   ALKPHOS 92 01/17/2023   AST 30 01/17/2023   ALT 15 01/17/2023   PROT 7.7 01/17/2023   ALBUMIN 4.0 01/17/2023   CALCIUM  9.2 01/17/2023   GFRAA >60 09/01/2017   QFTBGOLDPLUS NEGATIVE 01/05/2023    Speciality Comments: No specialty comments available.  Procedures:  No procedures performed Allergies: Bactrim [sulfamethoxazole-trimethoprim], Penicillins, Stelara  [ustekinumab ], Tetracyclines & related, and Ciprofloxacin    Assessment / Plan:     Visit Diagnoses: No diagnosis found.  Orders: No orders of the defined types were placed in this encounter.  No orders of the defined types were placed in this encounter.   Face-to-face time spent with patient was *** minutes. Greater than 50% of time was spent in counseling and coordination of care.  Follow-Up Instructions: No follow-ups on file.   Dee Farber, CMA  Note - This record has been created using Animal nutritionist.  Chart creation errors have been sought, but may not always  have been located. Such creation errors do not reflect on  the standard of medical care.

## 2023-06-05 ENCOUNTER — Ambulatory Visit (INDEPENDENT_AMBULATORY_CARE_PROVIDER_SITE_OTHER): Admitting: Gastroenterology

## 2023-06-05 ENCOUNTER — Encounter (INDEPENDENT_AMBULATORY_CARE_PROVIDER_SITE_OTHER): Payer: Self-pay | Admitting: Gastroenterology

## 2023-06-05 VITALS — BP 137/75 | HR 94 | Temp 97.5°F | Ht 63.0 in | Wt 185.9 lb

## 2023-06-05 DIAGNOSIS — K5090 Crohn's disease, unspecified, without complications: Secondary | ICD-10-CM

## 2023-06-05 DIAGNOSIS — K5080 Crohn's disease of both small and large intestine without complications: Secondary | ICD-10-CM

## 2023-06-05 NOTE — Progress Notes (Signed)
 Desiree Hogan, M.D. Gastroenterology & Hepatology [facility] Gastroenterology [address] [place], [place] [address]  Primary Care Physician: Orlena Bitters, MD [address] [place] [address]  I will communicate my assessment and recommendations to the referring MD via EMR.  Problems: Crohn's ileocolitis Possible anaphylactic reaction to Stelara    History of Present Illness: Desiree Hogan is a 68 y.o. female with pmh of Crohn's disease, anemia, COPD, DM, fibromyalgia, HLD, Hypothyroidism, IBS, PVD,  who presents for follow up of Crohn's disease.  The patient was last seen on 02/09/2023. At that time, the patient was started on vedolizumab .  She received her first vedolizumab  dose on 03/01/2023.  Patient reports that she is feeling much better with the medication. Having regular bowel movements daily, once a day formed. The patient denies having any nausea, vomiting, fever, chills, hematochezia, melena, hematemesis, abdominal distention, abdominal pain, diarrhea, jaundice, pruritus or weight loss.  Previous medications: Mesalamine , Stelara  (anaphylaxis)  Last flu shot:2024 Last pneumonia shot:never Last Pap smear: advised to stop by PCP Last evaluation by dermatology: never Last zoster vaccine: never Last DEXA scan: had osteopenia - following with PCP COVID-19 shot: none  Last Colonoscopy: 11/11/2022 Terminal ileum with few erosions, few erosions in the ascending colon, 4 mm polyp in the ascending colon, 1 mm polyp in the ascending colon, congested mucosa in the proximal transverse colon, presence of a 10 to 15 mm scar in the proximal transverse colon, diverticulosis.   A. COLON, PROXIMAL TRANSVERSE SCAR, BIOPSY:      Colonic mucosa with focal mild crypt architectural disarray.      Negative for activity, granuloma, dysplasia or malignancy.  B. SMALL BOWEL, BIOPSY:      Active ileitis with crypt architectural disarray.      Negative for granuloma,  dysplasia or malignancy.  C. COLON, ASCENDING, POLYPECTOMY:      Polypoid low-grade dysplasia.      Background colonic mucosa with active colitis and focal mild crypt architectural disarray.      Negative for granuloma, dysplasia or malignancy.  D. COLON, CECUM, ASCENDING, BIOPSY:      Colonic mucosa with active colitis and focal mild crypt architectural disarray.      Negative for granuloma, dysplasia or malignancy.  E. COLON, TRANSVERSE, BIOPSY:      Colonic mucosa with focal mild crypt architectural disarray.      Negative for activity, granuloma, dysplasia or malignancy   Past Medical History: Past Medical History:  Diagnosis Date   Abdominal pain    Anemia    Back pain    Colitis    COPD (chronic obstructive pulmonary disease) (HCC)    not on home o2   Crohn's disease (HCC) 03/29/12   Diabetes mellitus without complication (HCC)    Fibromyalgia    H/O breast biopsy    Hip pain    Hyperlipidemia    Hypothyroidism    Irritable bowel syndrome    Joint pain    Nonspecific abnormal finding in stool contents    Peripheral vascular disease (HCC)    Ulcer    Mouth    Past Surgical History: Past Surgical History:  Procedure Laterality Date   BIOPSY  12/20/2017   Procedure: BIOPSY;  Surgeon: Ruby Corporal, MD;  [facility];  Service: Endoscopy;;  cecal erosions   BIOPSY  11/11/2022   Procedure: BIOPSY;  Surgeon: Urban Garden, MD;  [facility];  Service: Gastroenterology;;   CESAREAN SECTION  06/24/82   CHOLECYSTECTOMY  1993   Gall Bladder   COLONOSCOPY  Jan. 31, 2014   COLONOSCOPY N/A 12/20/2017   Procedure: COLONOSCOPY;  Surgeon: Ruby Corporal, MD;  [facility];  Service: Endoscopy;  Laterality: N/A;  2:25   COLONOSCOPY WITH PROPOFOL  N/A 11/11/2022   Procedure: COLONOSCOPY WITH PROPOFOL ;  Surgeon: Urban Garden, MD;  [facility];  Service: Gastroenterology;  Laterality: N/A;  9:30AM;ASA 3    POLYPECTOMY  11/11/2022   Procedure: POLYPECTOMY;  Surgeon: Umberto Ganong, Bearl Limes, MD;  [facility];  Service: Gastroenterology;;   SPINE SURGERY  2010 and 2011   TONSILLECTOMY      Family History: Family History  Problem Relation Age of Onset   Cancer Mother        Colon or vaginal ?   Deep vein thrombosis Mother    Hypertension Father    Diabetes Father    Heart disease Father        Heart Disease before age 37   Diabetes Sister    Diabetes Brother    Hypertension Brother    Healthy Daughter    Healthy Daughter    Healthy Daughter    Healthy Son    Breast cancer Neg Hx     Social History: Social History   Tobacco Use  Smoking Status Former   Current packs/day: 0.00   Average packs/day: 0.8 packs/day for 20.0 years (15.0 ttl pk-yrs)   Types: Cigarettes   Start date: 01/03/1989   Quit date: 01/03/2009   Years since quitting: 14.4   Passive exposure: Never  Smokeless Tobacco Never   Social History   Substance and Sexual Activity  Alcohol  Use No   Social History   Substance and Sexual Activity  Drug Use No    Allergies: Allergies  Allergen Reactions   Bactrim [Sulfamethoxazole-Trimethoprim] Itching    And sores   Penicillins Anaphylaxis    Immediate rash, facial/tongue/throat swelling, SOB or lightheadedness with hypotension Severe rash involving mucus membranes or skin necrosis    Stelara  [Ustekinumab ] Nausea Only and Rash    Sweating, flushing, possible anaphylaxis   Tetracyclines & Related Swelling and Rash   Ciprofloxacin      Blisters and facial swelling    Medications: Current Outpatient Medications  Medication Sig Dispense Refill   alendronate (FOSAMAX) 70 MG tablet Take 70 mg by mouth every Friday. Take with a full glass of water  on an empty stomach.     BREO ELLIPTA  100-25 MCG/INH AEPB Inhale 1 puff into the lungs daily.      CALCIUM  GLUCONATE PO Take 600 mg by mouth 2 (two) times daily.      cetirizine (ZYRTEC) 10 MG tablet  Take 10 mg by mouth daily.     dicyclomine  (BENTYL ) 10 MG capsule TAKE 1 CAPSULE (10 MG TOTAL) BY MOUTH 3 (THREE) TIMES DAILY AS NEEDED FOR SPASMS. 270 capsule 1   DULoxetine  (CYMBALTA ) 30 MG capsule TAKE 1 CAPSULE BY MOUTH EVERY DAY 90 capsule 0   EPINEPHrine  0.3 mg/0.3 mL IJ SOAJ injection Inject 0.3 mg into the muscle as needed for anaphylaxis. 1 each 0   gemfibrozil  (LOPID ) 600 MG tablet Take 600 mg by mouth 2 (two) times daily.     levothyroxine  (SYNTHROID , LEVOTHROID) 125 MCG tablet Take 125 mcg by mouth daily.  3   lisinopril (ZESTRIL) 2.5 MG tablet Take 2.5 mg by mouth daily.     pregabalin  (LYRICA ) 50 MG capsule Take 1 capsule (50 mg  total) by mouth 2 (two) times daily. 60 capsule 5   rosuvastatin (CRESTOR) 10 MG tablet Take 10 mg by mouth daily.  3   tiZANidine  (ZANAFLEX ) 4 MG tablet TAKE 1 TABLET BY MOUTH AT BEDTIME AS NEEDED FOR MUSCLE SPASMS. 90 tablet 0   traMADol  (ULTRAM ) 50 MG tablet Take 1 tablet (50 mg total) by mouth 3 (three) times daily as needed. (Patient taking differently: Take 50 mg by mouth 3 (three) times daily as needed for moderate pain (pain score 4-6).) 90 tablet 5   Vedolizumab  (ENTYVIO  IV) Inject into the vein. Every two months at [facility].     No current facility-administered medications for this visit.    Review of Systems: GENERAL: negative for malaise, night sweats HEENT: No changes in hearing or vision, no nose bleeds or other nasal problems. NECK: Negative for lumps, goiter, pain and significant neck swelling RESPIRATORY: Negative for cough, wheezing CARDIOVASCULAR: Negative for chest pain, leg swelling, palpitations, orthopnea GI: SEE HPI MUSCULOSKELETAL: Negative for joint pain or swelling, back pain, and muscle pain. SKIN: Negative for lesions, rash PSYCH: Negative for sleep disturbance, mood disorder and recent psychosocial stressors. HEMATOLOGY Negative for prolonged bleeding, bruising easily, and swollen nodes. ENDOCRINE:  Negative for cold or heat intolerance, polyuria, polydipsia and goiter. NEURO: negative for tremor, gait imbalance, syncope and seizures. The remainder of the review of systems is noncontributory.   Physical Exam: BP 137/75 (BP Location: Left Arm, Patient Position: Sitting, Cuff Size: Large)   Pulse 94   Temp (!) 97.5 F (36.4 C) (Temporal)   Ht 5\' 3"  (1.6 m)   Wt 185 lb 14.4 oz (84.3 kg)   BMI 32.93 kg/m  GENERAL: The patient is AO x3, in no acute distress. HEENT: Head is normocephalic and atraumatic. EOMI are intact. Mouth is well hydrated and without lesions. NECK: Supple. No masses LUNGS: Clear to auscultation. No presence of rhonchi/wheezing/rales. Adequate chest expansion HEART: RRR, normal s1 and s2. ABDOMEN: Soft, nontender, no guarding, no peritoneal signs, and nondistended. BS +. No masses. EXTREMITIES: Without any cyanosis, clubbing, rash, lesions or edema. NEUROLOGIC: AOx3, no focal motor deficit. SKIN: no jaundice, no rashes  Imaging/Labs: as above  I personally reviewed and interpreted the available labs, imaging and endoscopic files.  Impression and Plan: MARRIE CHANDRA is a 68 y.o. female with pmh of Crohn's disease, anemia, COPD, DM, fibromyalgia, HLD, Hypothyroidism, IBS, PVD,  who presents for follow up of Crohn's disease.  Patient has presented significant improvement of her symptoms while on Entyvio , as she is currently in clinical remission after induction. No side effects or ongoing complaints.  Will check surveillance labs including fecal calprotectin, and continue Entyvio  every 8 weeks.  -Continue Entyvio  every 8 weeks - We will discuss repeat colonoscopy in follow-up appointment - CBC, CMP, CRP and iron studies - Check calprotectin - Please ask PCP about pneumonia and shingles vaccination  All questions were answered.      Desiree Cress, MD Gastroenterology and Hepatology [facility] Gastroenterology

## 2023-06-05 NOTE — Patient Instructions (Addendum)
 Continue Entyvio  every 8 weeks We will discuss repeat colonoscopy in follow-up appointment Perform blood and stool workup Please ask PCP about pneumonia and shingles vaccination

## 2023-06-06 DIAGNOSIS — K5080 Crohn's disease of both small and large intestine without complications: Secondary | ICD-10-CM | POA: Diagnosis not present

## 2023-06-07 ENCOUNTER — Ambulatory Visit (INDEPENDENT_AMBULATORY_CARE_PROVIDER_SITE_OTHER): Payer: Self-pay | Admitting: Gastroenterology

## 2023-06-07 ENCOUNTER — Encounter (INDEPENDENT_AMBULATORY_CARE_PROVIDER_SITE_OTHER): Payer: Self-pay

## 2023-06-07 DIAGNOSIS — K5080 Crohn's disease of both small and large intestine without complications: Secondary | ICD-10-CM | POA: Diagnosis not present

## 2023-06-07 LAB — CBC WITH DIFFERENTIAL/PLATELET
Absolute Lymphocytes: 756 {cells}/uL — ABNORMAL LOW (ref 850–3900)
Absolute Monocytes: 416 {cells}/uL (ref 200–950)
Basophils Absolute: 32 {cells}/uL (ref 0–200)
Basophils Relative: 0.8 %
Eosinophils Absolute: 204 {cells}/uL (ref 15–500)
Eosinophils Relative: 5.1 %
HCT: 34.3 % — ABNORMAL LOW (ref 35.0–45.0)
Hemoglobin: 10.8 g/dL — ABNORMAL LOW (ref 11.7–15.5)
MCH: 26.8 pg — ABNORMAL LOW (ref 27.0–33.0)
MCHC: 31.5 g/dL — ABNORMAL LOW (ref 32.0–36.0)
MCV: 85.1 fL (ref 80.0–100.0)
MPV: 12.9 fL — ABNORMAL HIGH (ref 7.5–12.5)
Monocytes Relative: 10.4 %
Neutro Abs: 2592 {cells}/uL (ref 1500–7800)
Neutrophils Relative %: 64.8 %
Platelets: 158 10*3/uL (ref 140–400)
RBC: 4.03 10*6/uL (ref 3.80–5.10)
RDW: 13.3 % (ref 11.0–15.0)
Total Lymphocyte: 18.9 %
WBC: 4 10*3/uL (ref 3.8–10.8)

## 2023-06-07 LAB — COMPREHENSIVE METABOLIC PANEL WITH GFR
AG Ratio: 1.8 (calc) (ref 1.0–2.5)
ALT: 21 U/L (ref 6–29)
AST: 37 U/L — ABNORMAL HIGH (ref 10–35)
Albumin: 4.4 g/dL (ref 3.6–5.1)
Alkaline phosphatase (APISO): 102 U/L (ref 37–153)
BUN: 20 mg/dL (ref 7–25)
CO2: 23 mmol/L (ref 20–32)
Calcium: 9.3 mg/dL (ref 8.6–10.4)
Chloride: 108 mmol/L (ref 98–110)
Creat: 0.79 mg/dL (ref 0.50–1.05)
Globulin: 2.5 g/dL (ref 1.9–3.7)
Glucose, Bld: 163 mg/dL — ABNORMAL HIGH (ref 65–99)
Potassium: 4.9 mmol/L (ref 3.5–5.3)
Sodium: 140 mmol/L (ref 135–146)
Total Bilirubin: 0.3 mg/dL (ref 0.2–1.2)
Total Protein: 6.9 g/dL (ref 6.1–8.1)
eGFR: 81 mL/min/{1.73_m2} (ref >=60)

## 2023-06-07 LAB — IRON,TIBC AND FERRITIN PANEL
%SAT: 7 % — ABNORMAL LOW (ref 16–45)
Ferritin: 8 ng/mL — ABNORMAL LOW (ref 16–288)
Iron: 37 ug/dL — ABNORMAL LOW (ref 45–160)
TIBC: 526 ug/dL — ABNORMAL HIGH (ref 250–450)

## 2023-06-07 LAB — C-REACTIVE PROTEIN: CRP: <3 mg/L (ref <8.0)

## 2023-06-08 ENCOUNTER — Other Ambulatory Visit (INDEPENDENT_AMBULATORY_CARE_PROVIDER_SITE_OTHER): Payer: Self-pay

## 2023-06-08 ENCOUNTER — Encounter (INDEPENDENT_AMBULATORY_CARE_PROVIDER_SITE_OTHER): Payer: Self-pay

## 2023-06-08 DIAGNOSIS — D509 Iron deficiency anemia, unspecified: Secondary | ICD-10-CM

## 2023-06-13 LAB — CALPROTECTIN: Calprotectin: 80 ug/g

## 2023-06-14 ENCOUNTER — Encounter: Payer: Self-pay | Admitting: Registered Nurse

## 2023-06-14 ENCOUNTER — Encounter: Attending: Registered Nurse | Admitting: Registered Nurse

## 2023-06-14 VITALS — BP 136/80 | HR 86 | Ht 63.0 in | Wt 185.0 lb

## 2023-06-14 DIAGNOSIS — Z5181 Encounter for therapeutic drug level monitoring: Secondary | ICD-10-CM | POA: Insufficient documentation

## 2023-06-14 DIAGNOSIS — M545 Low back pain, unspecified: Secondary | ICD-10-CM | POA: Diagnosis not present

## 2023-06-14 DIAGNOSIS — M1712 Unilateral primary osteoarthritis, left knee: Secondary | ICD-10-CM | POA: Diagnosis not present

## 2023-06-14 DIAGNOSIS — Z79891 Long term (current) use of opiate analgesic: Secondary | ICD-10-CM | POA: Insufficient documentation

## 2023-06-14 DIAGNOSIS — M25512 Pain in left shoulder: Secondary | ICD-10-CM | POA: Diagnosis not present

## 2023-06-14 DIAGNOSIS — G894 Chronic pain syndrome: Secondary | ICD-10-CM | POA: Diagnosis not present

## 2023-06-14 DIAGNOSIS — G8929 Other chronic pain: Secondary | ICD-10-CM | POA: Insufficient documentation

## 2023-06-14 MED ORDER — TRAMADOL HCL 50 MG PO TABS: 50.0000 mg | ORAL_TABLET | Freq: Three times a day (TID) | ORAL | 5 refills | Status: DC | PRN

## 2023-06-14 NOTE — Progress Notes (Signed)
 Subjective:    Patient ID: Desiree Hogan, female    DOB: 12-Dec-1955, 68 y.o.   MRN: [id number]  HPI: Desiree Hogan is a 68 y.o. female who returns for follow up appointment for chronic pain and medication refill. She states her pain is located in her left shoulder and lower back. She rates her pain 5. Her current exercise regime is walking and performing stretching exercises.  Ms. Holtmeyer Morphine  equivalent is 30.00 MME.   Oral Swab was Performed today.      Pain Inventory Average Pain 9 Pain Right Now 5 My pain is intermittent and sharp  In the last 24 hours, has pain interfered with the following? General activity 4 Relation with others 0 Enjoyment of life 3 What TIME of day is your pain at its worst? evening Sleep (in general) Fair  Pain is worse with: walking and some activites Pain improves with: heat/ice and medication Relief from Meds: 3  Family History  Problem Relation Age of Onset   Cancer Mother        Colon or vaginal ?   Deep vein thrombosis Mother    Hypertension Father    Diabetes Father    Heart disease Father        Heart Disease before age 39   Diabetes Sister    Diabetes Brother    Hypertension Brother    Healthy Daughter    Healthy Daughter    Healthy Daughter    Healthy Son    Breast cancer Neg Hx    Social History   Socioeconomic History   Marital status: Married    Spouse name: Not on file   Number of children: Not on file   Years of education: Not on file   Highest education level: Not on file  Occupational History   Not on file  Tobacco Use   Smoking status: Former    Current packs/day: 0.00    Average packs/day: 0.8 packs/day for 20.0 years (15.0 ttl pk-yrs)    Types: Cigarettes    Start date: 01/03/1989    Quit date: 01/03/2009    Years since quitting: 14.4    Passive exposure: Never   Smokeless tobacco: Never  Vaping Use   Vaping status: Never Used  Substance and Sexual Activity   Alcohol  use: No   Drug use: No   Sexual  activity: Not on file  Other Topics Concern   Not on file  Social History Narrative   Not on file   Social Drivers of Health   Financial Resource Strain: Not on file  Food Insecurity: Not on file  Transportation Needs: Not on file  Physical Activity: Not on file  Stress: Not on file  Social Connections: Not on file   Past Surgical History:  Procedure Laterality Date   BIOPSY  12/20/2017   Procedure: BIOPSY;  Surgeon: Ruby Corporal, MD;  [facility];  Service: Endoscopy;;  cecal erosions   BIOPSY  11/11/2022   Procedure: BIOPSY;  Surgeon: Urban Garden, MD;  [facility];  Service: Gastroenterology;;   CESAREAN SECTION  06/24/82   CHOLECYSTECTOMY  1993   Gall Bladder   COLONOSCOPY  Jan. 31, 2014   COLONOSCOPY N/A 12/20/2017   Procedure: COLONOSCOPY;  Surgeon: Ruby Corporal, MD;  [facility];  Service: Endoscopy;  Laterality: N/A;  2:25   COLONOSCOPY WITH PROPOFOL  N/A 11/11/2022   Procedure: COLONOSCOPY WITH PROPOFOL ;  Surgeon: Urban Garden, MD;  [facility];  Service: Gastroenterology;  Laterality: N/A;  9:30AM;ASA 3   POLYPECTOMY  11/11/2022   Procedure: POLYPECTOMY;  Surgeon: Urban Garden, MD;  [facility];  Service: Gastroenterology;;   [facility] SURGERY  2010 and 2011   TONSILLECTOMY     Past Surgical History:  Procedure Laterality Date   BIOPSY  12/20/2017   Procedure: BIOPSY;  Surgeon: Ruby Corporal, MD;  [facility];  Service: Endoscopy;;  cecal erosions   BIOPSY  11/11/2022   Procedure: BIOPSY;  Surgeon: Urban Garden, MD;  [facility];  Service: Gastroenterology;;   CESAREAN SECTION  06/24/82   CHOLECYSTECTOMY  1993   Gall Bladder   COLONOSCOPY  Jan. 31, 2014   COLONOSCOPY N/A 12/20/2017   Procedure: COLONOSCOPY;  Surgeon: Ruby Corporal, MD;  [facility];  Service: Endoscopy;  Laterality: N/A;  2:25   COLONOSCOPY WITH  PROPOFOL  N/A 11/11/2022   Procedure: COLONOSCOPY WITH PROPOFOL ;  Surgeon: Urban Garden, MD;  [facility];  Service: Gastroenterology;  Laterality: N/A;  9:30AM;ASA 3   POLYPECTOMY  11/11/2022   Procedure: POLYPECTOMY;  Surgeon: Umberto Ganong, Bearl Limes, MD;  [facility];  Service: Gastroenterology;;   SPINE SURGERY  2010 and 2011   TONSILLECTOMY     Past Medical History:  Diagnosis Date   Abdominal pain    Anemia    Back pain    Colitis    COPD (chronic obstructive pulmonary disease) (HCC)    not on home o2   Crohn's disease (HCC) 03/29/12   Diabetes mellitus without complication (HCC)    Fibromyalgia    H/O breast biopsy    Hip pain    Hyperlipidemia    Hypothyroidism    Irritable bowel syndrome    Joint pain    Nonspecific abnormal finding in stool contents    Peripheral vascular disease (HCC)    Ulcer    Mouth   There were no vitals taken for this visit.  Opioid Risk Score:   Fall Risk Score:  `1  Depression screen PHQ 2/9     05/10/2023   12:53 PM 03/15/2023   12:56 PM 03/01/2023    1:25 PM 01/17/2023    1:20 PM 06/10/2022    2:14 PM 06/08/2021    2:19 PM 11/17/2020    1:41 PM  Depression screen PHQ 2/9  Decreased Interest 0 0 0 0 0 0 0  Down, Depressed, Hopeless 0 0 0 0 0 0 0  PHQ - 2 Score 0 0 0 0 0 0 0    Review of Systems  Musculoskeletal:  Positive for back pain.       Left shoulder pain  All other systems reviewed and are negative.      Objective:   Physical Exam Vitals and nursing note reviewed.  Constitutional:      Appearance: Normal appearance.  Cardiovascular:     Rate and Rhythm: Normal rate and regular rhythm.     Pulses: Normal pulses.     Heart sounds: Normal heart sounds.  Pulmonary:     Effort: Pulmonary effort is normal.     Breath sounds: Normal breath sounds.  Musculoskeletal:     Comments: Normal Muscle Bulk and Muscle Testing Reveals:  Upper Extremities: Full ROM and Muscle Strength 5/5 Left AC  Joint Tenderness  Thoracic Paraspinal Tenderness: T-1-T-2 Mainly Left Side Lumbar Paraspinal Tenderness: L-4-L-5 Lower Extremities: Full ROM and Muscle Strength 5/5  Arises from Chair with ease Narrow Based  Gait     Skin:    General: Skin is warm and dry.  Neurological:     Mental Status: She is alert and oriented to person, place, and time.  Psychiatric:        Mood and Affect: Mood normal.        Behavior: Behavior normal.         Assessment & Plan:  Left  Shoulder Pain:  Continue HEP as Tolerated. Continue current medication regimen. Continue to Monitor. 06/14/2023 Lower Back Pain without Sciatica: Continue HEP as Tolerated. Continue current medication regimen. Continue to Monitor. 06/14/2023 Primary OA in Bilateral Knees:L>R. Continue HEP as Tolerated. Continue Current Medication Regimen. Continue to Monitor. 06/14/2023 Fibromyalgia: Continue HEP as Tolerated. Continue Lyrica .  Continue to Monitor. 06/14/2023 Chronic Pain Syndrome: Continue  Tramadol  50 mg three times a day as needed for pain #90. We will continue the opioid monitoring program, this consists of regular clinic visits, examinations, urine drug screen, pill counts as well as use of [place]  Controlled Substance Reporting system. A 12 month History has been reviewed on the   Controlled Substance Reporting System on 06/14/2023.   F/U in 6 months

## 2023-06-14 NOTE — Progress Notes (Deleted)
 Office Visit Note  Patient: Desiree Hogan             Date of Birth: 27-Aug-1955           MRN: [id number]             PCP: Orlena Bitters, MD Referring: Orlena Bitters, MD Visit Date: 06/28/2023 Occupation: @GUAROCC @  Subjective:  No chief complaint on file.   History of Present Illness: Desiree Hogan is a 68 y.o. female ***     Activities of Daily Living:  Patient reports morning stiffness for *** {minute/hour:19697}.   Patient {ACTIONS;DENIES/REPORTS:21021675::Denies} nocturnal pain.  Difficulty dressing/grooming: {ACTIONS;DENIES/REPORTS:21021675::Denies} Difficulty climbing stairs: {ACTIONS;DENIES/REPORTS:21021675::Denies} Difficulty getting out of chair: {ACTIONS;DENIES/REPORTS:21021675::Denies} Difficulty using hands for taps, buttons, cutlery, and/or writing: {ACTIONS;DENIES/REPORTS:21021675::Denies}  No Rheumatology ROS completed.   PMFS History:  Patient Active Problem List   Diagnosis Date Noted  . Crohn's disease of both small and large intestine (HCC) 01/05/2023  . Elevated fecal calprotectin 12/06/2021  . Nausea without vomiting 10/20/2020  . Abdominal pain 07/17/2020  . Diarrhea 06/23/2020  . Diverticulitis of colon 10/11/2017  . Fibromyalgia 07/14/2017  . ANA positive 07/14/2017  . Primary osteoarthritis of both knees 07/14/2017  . DDD (degenerative disc disease), lumbar 07/14/2017  . Hypermobility of joint 07/14/2017  . Age-related osteoporosis without current pathological fracture 07/14/2017  . Other insomnia 07/14/2017  . History of Crohn's disease 07/14/2017  . History of IBS 07/14/2017  . History of hypercholesterolemia 07/14/2017  . History of COPD 07/14/2017  . History of cholecystectomy 07/14/2017  . Community acquired pneumonia 12/13/2016  . Pneumonia 12/13/2016  . Leukocytosis 12/13/2016  . Hypokalemia 12/13/2016  . Diabetes mellitus without complication (HCC) 12/13/2016    Past Medical History:  Diagnosis Date  . Abdominal  pain   . Anemia   . Back pain   . Colitis   . COPD (chronic obstructive pulmonary disease) (HCC)    not on home o2  . Crohn's disease (HCC) 03/29/12  . Diabetes mellitus without complication (HCC)   . Fibromyalgia   . H/O breast biopsy   . Hip pain   . Hyperlipidemia   . Hypothyroidism   . Irritable bowel syndrome   . Joint pain   . Nonspecific abnormal finding in stool contents   . Peripheral vascular disease (HCC)   . Ulcer    Mouth    Family History  Problem Relation Age of Onset  . Cancer Mother        Colon or vaginal ?  . Deep vein thrombosis Mother   . Hypertension Father   . Diabetes Father   . Heart disease Father        Heart Disease before age 22  . Diabetes Sister   . Diabetes Brother   . Hypertension Brother   . Healthy Daughter   . Healthy Daughter   . Healthy Daughter   . Healthy Son   . Breast cancer Neg Hx    Past Surgical History:  Procedure Laterality Date  . BIOPSY  12/20/2017   Procedure: BIOPSY;  Surgeon: Ruby Corporal, MD;  [facility];  Service: Endoscopy;;  cecal erosions  . BIOPSY  11/11/2022   Procedure: BIOPSY;  Surgeon: Urban Garden, MD;  [facility];  Service: Gastroenterology;;  . CESAREAN SECTION  06/24/82  . CHOLECYSTECTOMY  1993   Gall Bladder  . COLONOSCOPY  Jan. 31, 2014  . COLONOSCOPY N/A 12/20/2017   Procedure: COLONOSCOPY;  Surgeon:  Ruby Corporal, MD;  [facility];  Service: Endoscopy;  Laterality: N/A;  2:25  . COLONOSCOPY WITH PROPOFOL  N/A 11/11/2022   Procedure: COLONOSCOPY WITH PROPOFOL ;  Surgeon: Urban Garden, MD;  [facility];  Service: Gastroenterology;  Laterality: N/A;  9:30AM;ASA 3  . POLYPECTOMY  11/11/2022   Procedure: POLYPECTOMY;  Surgeon: Urban Garden, MD;  [facility];  Service: Gastroenterology;;  . SPINE SURGERY  2010 and 2011  . TONSILLECTOMY     Social History   Social History Narrative  . Not on file     There is no immunization history on file for this patient.   Objective: Vital Signs: There were no vitals taken for this visit.   Physical Exam   Musculoskeletal Exam: ***  CDAI Exam: CDAI Score: -- Patient Global: --; Provider Global: -- Swollen: --; Tender: -- Joint Exam 06/28/2023   No joint exam has been documented for this visit   There is currently no information documented on the homunculus. Go to the Rheumatology activity and complete the homunculus joint exam.  Investigation: No additional findings.  Imaging: No results found.  Recent Labs: Lab Results  Component Value Date   WBC 4.0 06/06/2023   HGB 10.8 (L) 06/06/2023   PLT 158 06/06/2023   NA 140 06/06/2023   K 4.9 06/06/2023   CL 108 06/06/2023   CO2 23 06/06/2023   GLUCOSE 163 (H) 06/06/2023   BUN 20 06/06/2023   CREATININE 0.79 06/06/2023   BILITOT 0.3 06/06/2023   ALKPHOS 92 01/17/2023   AST 37 (H) 06/06/2023   ALT 21 06/06/2023   PROT 6.9 06/06/2023   ALBUMIN 4.0 01/17/2023   CALCIUM  9.3 06/06/2023   GFRAA >60 09/01/2017   QFTBGOLDPLUS NEGATIVE 01/05/2023    Speciality Comments: No specialty comments available.  Procedures:  No procedures performed Allergies: Bactrim [sulfamethoxazole-trimethoprim], Penicillins, Stelara  [ustekinumab ], Tetracyclines & related, and Ciprofloxacin    Assessment / Plan:     Visit Diagnoses: Fibromyalgia  Trapezius muscle spasm  ANA positive  Chronic SI joint pain  Primary osteoarthritis of both knees  Lumbar spondylosis  Hypermobility of joint  Age-related osteoporosis without current pathological fracture  Other insomnia  History of hypercholesterolemia  History of COPD  History of Crohn's disease  History of IBS  History of cholecystectomy  Orders: No orders of the defined types were placed in this encounter.  No orders of the defined types were placed in this encounter.   Face-to-face time spent with patient was *** minutes.  Greater than 50% of time was spent in counseling and coordination of care.  Follow-Up Instructions: No follow-ups on file.   Romayne Clubs, PA-C  Note - This record has been created using Dragon software.  Chart creation errors have been sought, but may not always  have been located. Such creation errors do not reflect on  the standard of medical care.

## 2023-06-15 ENCOUNTER — Other Ambulatory Visit: Payer: Self-pay | Admitting: Registered Nurse

## 2023-06-15 ENCOUNTER — Ambulatory Visit: Payer: Medicare HMO | Admitting: Rheumatology

## 2023-06-15 DIAGNOSIS — Z8639 Personal history of other endocrine, nutritional and metabolic disease: Secondary | ICD-10-CM

## 2023-06-15 DIAGNOSIS — Z8709 Personal history of other diseases of the respiratory system: Secondary | ICD-10-CM

## 2023-06-15 DIAGNOSIS — G8929 Other chronic pain: Secondary | ICD-10-CM

## 2023-06-15 DIAGNOSIS — M51369 Other intervertebral disc degeneration, lumbar region without mention of lumbar back pain or lower extremity pain: Secondary | ICD-10-CM

## 2023-06-15 DIAGNOSIS — M170 Bilateral primary osteoarthritis of knee: Secondary | ICD-10-CM

## 2023-06-15 DIAGNOSIS — M62838 Other muscle spasm: Secondary | ICD-10-CM

## 2023-06-15 DIAGNOSIS — M797 Fibromyalgia: Secondary | ICD-10-CM

## 2023-06-15 DIAGNOSIS — M249 Joint derangement, unspecified: Secondary | ICD-10-CM

## 2023-06-15 DIAGNOSIS — R768 Other specified abnormal immunological findings in serum: Secondary | ICD-10-CM

## 2023-06-15 DIAGNOSIS — Z9049 Acquired absence of other specified parts of digestive tract: Secondary | ICD-10-CM

## 2023-06-15 DIAGNOSIS — Z8719 Personal history of other diseases of the digestive system: Secondary | ICD-10-CM

## 2023-06-15 DIAGNOSIS — G4709 Other insomnia: Secondary | ICD-10-CM

## 2023-06-15 DIAGNOSIS — M810 Age-related osteoporosis without current pathological fracture: Secondary | ICD-10-CM

## 2023-06-16 ENCOUNTER — Ambulatory Visit: Payer: Medicare HMO | Admitting: Registered Nurse

## 2023-06-18 LAB — DRUG TOX MONITOR 1 W/CONF, ORAL FLD

## 2023-06-18 LAB — DRUG TOX ALC METAB W/CON, ORAL FLD: Alcohol Metabolite: NEGATIVE ng/mL (ref <25)

## 2023-06-22 NOTE — Progress Notes (Unsigned)
 Office Visit Note  Patient: Desiree Hogan             Date of Birth: 06-10-1955           MRN: [id number]             PCP: Rosamond Leta NOVAK, MD Referring: Rosamond Leta NOVAK, MD Visit Date: 07/06/2023 Occupation: @GUAROCC @  Subjective:  Bilateral SI joint pain   History of Present Illness: Desiree Hogan is a 68 y.o. female with history of fibromyalgia and osteoarthritis. Patient presents today with increased lower back pain for the past 2 weeks.  She denies any falls or injuries prior to the onset of symptoms.  She has soreness over both SI joints and denies any midline spinal tenderness.  Patient denies any numbness or weakness going down her legs.  She is not experiencing any radiating pain.  Patient has tried both ice and heat with no relief.  She has been taking tramadol  for pain relief.  She remains on Lyrica , Cymbalta , and tizanidine  as prescribed.  She experiences intermittent trapezius muscle tension and tenderness bilaterally.  She trigger point injections performed on 12/15/2018 for which righted significant relief. She denies any other joint pain or joint swelling at this time.    Activities of Daily Living:  Patient reports morning stiffness for 45-60 minutes.   Patient Reports nocturnal pain.  Difficulty dressing/grooming: Denies Difficulty climbing stairs: Denies Difficulty getting out of chair: Denies Difficulty using hands for taps, buttons, cutlery, and/or writing: Denies  Review of Systems  Constitutional:  Positive for fatigue.  HENT:  Positive for mouth sores and mouth dryness.   Eyes:  Negative for dryness.  Respiratory:  Negative for shortness of breath.   Cardiovascular:  Negative for chest pain and palpitations.  Gastrointestinal:  Negative for blood in stool, constipation and diarrhea.  Endocrine: Negative for increased urination.  Genitourinary:  Negative for involuntary urination.  Musculoskeletal:  Positive for joint pain, joint pain, joint swelling, muscle  weakness, morning stiffness and muscle tenderness. Negative for gait problem, myalgias and myalgias.  Skin:  Negative for color change, rash, hair loss and sensitivity to sunlight.  Allergic/Immunologic: Positive for susceptible to infections.  Neurological:  Positive for headaches. Negative for dizziness.  Hematological:  Negative for swollen glands.  Psychiatric/Behavioral:  Negative for depressed mood and sleep disturbance. The patient is not nervous/anxious.     PMFS History:  Patient Active Problem List   Diagnosis Date Noted   Crohn's disease of both small and large intestine (HCC) 01/05/2023   Elevated fecal calprotectin 12/06/2021   Nausea without vomiting 10/20/2020   Abdominal pain 07/17/2020   Diarrhea 06/23/2020   Diverticulitis of colon 10/11/2017   Fibromyalgia 07/14/2017   ANA positive 07/14/2017   Primary osteoarthritis of both knees 07/14/2017   DDD (degenerative disc disease), lumbar 07/14/2017   Hypermobility of joint 07/14/2017   Age-related osteoporosis without current pathological fracture 07/14/2017   Other insomnia 07/14/2017   History of Crohn's disease 07/14/2017   History of IBS 07/14/2017   History of hypercholesterolemia 07/14/2017   History of COPD 07/14/2017   History of cholecystectomy 07/14/2017   Community acquired pneumonia 12/13/2016   Pneumonia 12/13/2016   Leukocytosis 12/13/2016   Hypokalemia 12/13/2016   Diabetes mellitus without complication (HCC) 12/13/2016    Past Medical History:  Diagnosis Date   Abdominal pain    Anemia    Back pain    Colitis    COPD (chronic obstructive pulmonary disease) (HCC)  not on home o2   Crohn's disease (HCC) 03/29/12   Diabetes mellitus without complication (HCC)    Fibromyalgia    H/O breast biopsy    Hip pain    Hyperlipidemia    Hypothyroidism    Irritable bowel syndrome    Joint pain    Nonspecific abnormal finding in stool contents    Peripheral vascular disease (HCC)    Ulcer     Mouth    Family History  Problem Relation Age of Onset   Cancer Mother        Colon or vaginal ?   Deep vein thrombosis Mother    Hypertension Father    Diabetes Father    Heart disease Father        Heart Disease before age 60   Diabetes Sister    Diabetes Brother    Hypertension Brother    Healthy Daughter    Healthy Daughter    Healthy Daughter    Healthy Son    Breast cancer Neg Hx    Past Surgical History:  Procedure Laterality Date   BIOPSY  12/20/2017   Procedure: BIOPSY;  Surgeon: Golda Claudis PENNER, MD;  [facility]: [facility];  Service: Endoscopy;;  cecal erosions   BIOPSY  11/11/2022   Procedure: BIOPSY;  Surgeon: Eartha Angelia Sieving, MD;  Location: [facility];  Service: Gastroenterology;;   CESAREAN SECTION  06/24/82   CHOLECYSTECTOMY  1993   Gall Bladder   COLONOSCOPY  Jan. 31, 2014   COLONOSCOPY N/A 12/20/2017   Procedure: COLONOSCOPY;  Surgeon: Golda Claudis PENNER, MD;  Location: [facility];  Service: Endoscopy;  Laterality: N/A;  2:25   COLONOSCOPY WITH PROPOFOL  N/A 11/11/2022   Procedure: COLONOSCOPY WITH PROPOFOL ;  Surgeon: Eartha Angelia Sieving, MD;  Location: [facility];  Service: Gastroenterology;  Laterality: N/A;  9:30AM;ASA 3   POLYPECTOMY  11/11/2022   Procedure: POLYPECTOMY;  Surgeon: Eartha Angelia Sieving, MD;  Location: [facility];  Service: Gastroenterology;;   SPINE SURGERY  2010 and 2011   TONSILLECTOMY     Social History   Social History Narrative   Not on file    There is no immunization history on file for this patient.   Objective: Vital Signs: BP 133/73 (BP Location: Left Arm, Patient Position: Sitting, Cuff Size: Normal)   Pulse 80   Resp 16   Ht 5' 3 (1.6 m)   Wt 187 lb (84.8 kg)   BMI 33.13 kg/m    Physical Exam Vitals and nursing note reviewed.  Constitutional:      Appearance: She is well-developed.  HENT:     Head: Normocephalic and atraumatic.  Eyes:     Conjunctiva/sclera: Conjunctivae  normal.  Cardiovascular:     Rate and Rhythm: Normal rate and regular rhythm.     Heart sounds: Normal heart sounds.  Pulmonary:     Effort: Pulmonary effort is normal.     Breath sounds: Normal breath sounds.  Abdominal:     General: Bowel sounds are normal.     Palpations: Abdomen is soft.  Musculoskeletal:     Cervical back: Normal range of motion.  Lymphadenopathy:     Cervical: No cervical adenopathy.  Skin:    General: Skin is warm and dry.     Capillary Refill: Capillary refill takes less than 2 seconds.  Neurological:     Mental Status: She is alert and oriented to person, place, and time.  Psychiatric:  Behavior: Behavior normal.      Musculoskeletal Exam: C-spine has limited range of motion with lateral rotation.  Good flexion and extension of the cervical spine.  Slightly limited range of motion of the lumbar spine.  No midline spinal tenderness.  Tenderness over both SI joints.  Shoulder joints, elbow joints, wrist joints MCPs, PIPs, DIPs have good range of motion with no synovitis.  Complete fist formation bilaterally.  Hip joints slightly limited range of motion.  No tenderness over the trochanteric bursa at this time.  Knee joints have good range of motion no warmth or effusion.  Ankle joints have good range of motion with no tenderness or joint swelling.  CDAI Exam: CDAI Score: -- Patient Global: --; Provider Global: -- Swollen: --; Tender: -- Joint Exam 07/06/2023   No joint exam has been documented for this visit   There is currently no information documented on the homunculus. Go to the Rheumatology activity and complete the homunculus joint exam.  Investigation: No additional findings.  Imaging: No results found.  Recent Labs: Lab Results  Component Value Date   WBC 6.2 07/05/2023   HGB 12.1 07/05/2023   PLT 215 07/05/2023   NA 140 06/06/2023   K 4.9 06/06/2023   CL 108 06/06/2023   CO2 23 06/06/2023   GLUCOSE 163 (H) 06/06/2023   BUN 20  06/06/2023   CREATININE 0.79 06/06/2023   BILITOT 0.3 06/06/2023   ALKPHOS 92 01/17/2023   AST 37 (H) 06/06/2023   ALT 21 06/06/2023   PROT 6.9 06/06/2023   ALBUMIN 4.0 01/17/2023   CALCIUM  9.3 06/06/2023   GFRAA >60 09/01/2017   QFTBGOLDPLUS NEGATIVE 01/05/2023    Speciality Comments: No specialty comments available.  Procedures:  Sacroiliac Joint Inj on 07/06/2023 1:42 PM Indications: pain Details: 27 G 1.5 in needle, posterior approach Medications (Right): 1 mL lidocaine  1 %; 40 mg triamcinolone  acetonide 40 MG/ML Aspirate (Right): 0 mL Medications (Left): 1 mL lidocaine  1 %; 40 mg triamcinolone  acetonide 40 MG/ML Aspirate (Left): 0 mL Outcome: tolerated well, no immediate complications Procedure, treatment alternatives, risks and benefits explained, specific risks discussed. Consent was given by the patient. Immediately prior to procedure a time out was called to verify the correct patient, procedure, equipment, support staff and site/side marked as required. Patient was prepped and draped in the usual sterile fashion.     Allergies: Bactrim [sulfamethoxazole-trimethoprim], Penicillins, Stelara  [ustekinumab ], Tetracyclines & related, and Ciprofloxacin    Assessment / Plan:     Visit Diagnoses: Fibromyalgia -She experiences intermittent myalgias and muscle tenderness due to fibromyalgia.  She remains on Cymbalta  and Lyrica  as prescribed.  She is been taking tramadol  for pain relief.  She experiences intermittent trapezius muscle tension but overall her symptoms have been manageable.  She had trigger point injections performed on 12/15/2018 for which righted significant relief.  She has been taking tizanidine  4 mg at bedtime as needed for muscle spasms which has been helpful.  She does not need any refills at this time.  She was advised to notify us  if she develops any new or worsening symptoms.  She will follow-up in the office in 6 months or sooner if needed.    Trapezius muscle  spasm: She had trigger point injections performed on 12/15/2022 which provided significant relief.  She takes tizanidine  4 mg at bedtime as needed for muscle spasms.  ANA positive - +RF, -CCP: No clinical features of systemic lupus at this time.  Chronic SI joint pain - She presents today  with bilateral SI joint pain x 2 weeks.  She has been experiencing intermittent nocturnal pain but has had difficulty standing and sitting for prolonged periods of time due to the discomfort.  She is not experiencing any radiating pain or numbness.  No muscular weakness.  She has tenderness over both SI joints on examination today.  No midline spinal tenderness.  Different treatment options were discussed today in detail.  Patient previously had significant relief after having cortisone injections performed in 2022.   X-rays of the pelvis were updated today.   After informed consent both SI joints were injected with cortisone and lidocaine  by Dr. Dolphus.  She tolerated procedures well.  Procedure notes were completed above.  Aftercare was discussed.  She was advised to notify us  if her symptoms persist or worsen.  Plan: XR Pelvis 1-2 Views  Primary osteoarthritis of both knees: She has good range of motion of both knee joints on examination today.  No warmth or effusion noted.  She has some difficulty climbing steps.  Degeneration of intervertebral disc of lumbar region without discogenic back pain or lower extremity pain: Patient presents today with increased discomfort in her lower back x 2 weeks.  She has not had any fall or injury prior to the onset of symptoms.  She is not experiencing any symptoms of radiculopathy.  No lower extremity weakness or numbness at this time.  She has some limited forward flexion but no midline spinal tenderness in the lumbar region.  Most of her discomfort is due to pain in both SI joints.  X-rays of the pelvis were updated today.  After informed consent both SI joints were injected  with cortisone.  Procedure note was completed above.  She was advised to notify us  if her symptoms persist or worsen.  Hypermobility of joint  Age-related osteoporosis without current pathological fracture - Managed by PCP. She takes Fosamax 70 mg 1 tablet by mouth once weekly for management of osteoporosis.  No DEXA in Epic to review.  Other medical conditions are listed as follows:  Other insomnia  History of hypercholesterolemia  History of COPD  History of Crohn's disease  History of IBS  History of cholecystectomy  Orders: Orders Placed This Encounter  Procedures   Sacroiliac Joint Inj   XR Pelvis 1-2 Views   No orders of the defined types were placed in this encounter.   Follow-Up Instructions: Return in about 6 months (around 01/06/2024) for Fibromyalgia, Osteoarthritis.   Waddell CHRISTELLA Craze, PA-C  Note - This record has been created using Dragon software.  Chart creation errors have been sought, but may not always  have been located. Such creation errors do not reflect on  the standard of medical care.

## 2023-06-28 ENCOUNTER — Ambulatory Visit: Admitting: Physician Assistant

## 2023-06-28 DIAGNOSIS — Z8709 Personal history of other diseases of the respiratory system: Secondary | ICD-10-CM

## 2023-06-28 DIAGNOSIS — R768 Other specified abnormal immunological findings in serum: Secondary | ICD-10-CM

## 2023-06-28 DIAGNOSIS — Z8639 Personal history of other endocrine, nutritional and metabolic disease: Secondary | ICD-10-CM

## 2023-06-28 DIAGNOSIS — G4709 Other insomnia: Secondary | ICD-10-CM

## 2023-06-28 DIAGNOSIS — M62838 Other muscle spasm: Secondary | ICD-10-CM

## 2023-06-28 DIAGNOSIS — M47816 Spondylosis without myelopathy or radiculopathy, lumbar region: Secondary | ICD-10-CM

## 2023-06-28 DIAGNOSIS — M797 Fibromyalgia: Secondary | ICD-10-CM

## 2023-06-28 DIAGNOSIS — G8929 Other chronic pain: Secondary | ICD-10-CM

## 2023-06-28 DIAGNOSIS — M170 Bilateral primary osteoarthritis of knee: Secondary | ICD-10-CM

## 2023-06-28 DIAGNOSIS — Z8719 Personal history of other diseases of the digestive system: Secondary | ICD-10-CM

## 2023-06-28 DIAGNOSIS — Z9049 Acquired absence of other specified parts of digestive tract: Secondary | ICD-10-CM

## 2023-06-28 DIAGNOSIS — M810 Age-related osteoporosis without current pathological fracture: Secondary | ICD-10-CM

## 2023-06-28 DIAGNOSIS — M249 Joint derangement, unspecified: Secondary | ICD-10-CM

## 2023-06-30 ENCOUNTER — Telehealth: Payer: Self-pay

## 2023-06-30 ENCOUNTER — Other Ambulatory Visit: Payer: Self-pay | Admitting: Physician Assistant

## 2023-06-30 NOTE — Telephone Encounter (Signed)
 Pharmacy needs a diagnose code, for opoid and muscle relaxers conformation, diagnose codes needed for Tramadol 

## 2023-06-30 NOTE — Telephone Encounter (Signed)
 Last Fill: 04/26/2023   Next Visit: 06/15/2023   Last Visit: 12/15/2022   Dx: Fibromyalgia    Current Dose per office note on 12/15/2022: DULoxetine  (CYMBALTA ) Take 1 capsule (30 mg total) by mouth daily.    Okay to refill Cymbalta ?

## 2023-07-05 ENCOUNTER — Encounter: Attending: Gastroenterology | Admitting: *Deleted

## 2023-07-05 VITALS — BP 149/76 | HR 80 | Temp 98.0°F | Resp 18

## 2023-07-05 DIAGNOSIS — M62838 Other muscle spasm: Secondary | ICD-10-CM | POA: Insufficient documentation

## 2023-07-05 DIAGNOSIS — Z9049 Acquired absence of other specified parts of digestive tract: Secondary | ICD-10-CM | POA: Insufficient documentation

## 2023-07-05 DIAGNOSIS — M797 Fibromyalgia: Secondary | ICD-10-CM | POA: Insufficient documentation

## 2023-07-05 DIAGNOSIS — Z8709 Personal history of other diseases of the respiratory system: Secondary | ICD-10-CM | POA: Insufficient documentation

## 2023-07-05 DIAGNOSIS — K5080 Crohn's disease of both small and large intestine without complications: Secondary | ICD-10-CM | POA: Diagnosis not present

## 2023-07-05 DIAGNOSIS — M249 Joint derangement, unspecified: Secondary | ICD-10-CM | POA: Insufficient documentation

## 2023-07-05 DIAGNOSIS — Z8639 Personal history of other endocrine, nutritional and metabolic disease: Secondary | ICD-10-CM | POA: Insufficient documentation

## 2023-07-05 DIAGNOSIS — G8929 Other chronic pain: Secondary | ICD-10-CM | POA: Insufficient documentation

## 2023-07-05 DIAGNOSIS — M51369 Other intervertebral disc degeneration, lumbar region without mention of lumbar back pain or lower extremity pain: Secondary | ICD-10-CM | POA: Insufficient documentation

## 2023-07-05 DIAGNOSIS — D509 Iron deficiency anemia, unspecified: Secondary | ICD-10-CM | POA: Diagnosis not present

## 2023-07-05 DIAGNOSIS — G4709 Other insomnia: Secondary | ICD-10-CM | POA: Insufficient documentation

## 2023-07-05 DIAGNOSIS — Z8719 Personal history of other diseases of the digestive system: Secondary | ICD-10-CM | POA: Insufficient documentation

## 2023-07-05 DIAGNOSIS — M170 Bilateral primary osteoarthritis of knee: Secondary | ICD-10-CM | POA: Insufficient documentation

## 2023-07-05 DIAGNOSIS — M533 Sacrococcygeal disorders, not elsewhere classified: Secondary | ICD-10-CM | POA: Insufficient documentation

## 2023-07-05 DIAGNOSIS — R768 Other specified abnormal immunological findings in serum: Secondary | ICD-10-CM | POA: Insufficient documentation

## 2023-07-05 DIAGNOSIS — M810 Age-related osteoporosis without current pathological fracture: Secondary | ICD-10-CM | POA: Insufficient documentation

## 2023-07-05 MED ORDER — ACETAMINOPHEN 325 MG PO TABS
650.0000 mg | ORAL_TABLET | Freq: Once | ORAL | Status: AC
  Administered 2023-07-05: 650 mg via ORAL

## 2023-07-05 MED ORDER — VEDOLIZUMAB 300 MG IV SOLR
300.0000 mg | Freq: Once | INTRAVENOUS | Status: AC
  Administered 2023-07-05: 300 mg via INTRAVENOUS
  Filled 2023-07-05: qty 5

## 2023-07-05 MED ORDER — METHYLPREDNISOLONE SODIUM SUCC 125 MG IJ SOLR
125.0000 mg | Freq: Once | INTRAMUSCULAR | Status: AC
  Administered 2023-07-05: 125 mg via INTRAVENOUS

## 2023-07-05 MED ORDER — DIPHENHYDRAMINE HCL 50 MG/ML IJ SOLN
50.0000 mg | Freq: Once | INTRAMUSCULAR | Status: AC
  Administered 2023-07-05: 50 mg via INTRAVENOUS

## 2023-07-05 NOTE — Progress Notes (Signed)
 Diagnosis: Crohn's Disease  Provider:  Eartha Sieving MD  Procedure: IV Infusion  IV Type: Peripheral, IV Location: R Antecubital  Entyvio  (Vedolizumab ), Dose: 300 mg  Infusion Start Time: 1325  Infusion Stop Time: 1401  Post Infusion IV Care: Observation period completed and Peripheral IV Discontinued  Discharge: Condition: Good, Destination: Home . AVS Provided  Performed by:  Lula Kolton Ragsdale, RN

## 2023-07-06 ENCOUNTER — Ambulatory Visit: Attending: Physician Assistant

## 2023-07-06 ENCOUNTER — Ambulatory Visit (INDEPENDENT_AMBULATORY_CARE_PROVIDER_SITE_OTHER): Payer: Self-pay | Admitting: Gastroenterology

## 2023-07-06 ENCOUNTER — Other Ambulatory Visit (INDEPENDENT_AMBULATORY_CARE_PROVIDER_SITE_OTHER): Payer: Self-pay

## 2023-07-06 ENCOUNTER — Encounter: Payer: Self-pay | Admitting: Physician Assistant

## 2023-07-06 ENCOUNTER — Encounter: Admitting: Physician Assistant

## 2023-07-06 ENCOUNTER — Encounter (INDEPENDENT_AMBULATORY_CARE_PROVIDER_SITE_OTHER): Payer: Self-pay

## 2023-07-06 VITALS — BP 133/73 | HR 80 | Resp 16 | Ht 63.0 in | Wt 187.0 lb

## 2023-07-06 DIAGNOSIS — G8929 Other chronic pain: Secondary | ICD-10-CM | POA: Diagnosis not present

## 2023-07-06 DIAGNOSIS — G4709 Other insomnia: Secondary | ICD-10-CM

## 2023-07-06 DIAGNOSIS — R768 Other specified abnormal immunological findings in serum: Secondary | ICD-10-CM

## 2023-07-06 DIAGNOSIS — M533 Sacrococcygeal disorders, not elsewhere classified: Secondary | ICD-10-CM

## 2023-07-06 DIAGNOSIS — Z8709 Personal history of other diseases of the respiratory system: Secondary | ICD-10-CM | POA: Diagnosis not present

## 2023-07-06 DIAGNOSIS — Z9049 Acquired absence of other specified parts of digestive tract: Secondary | ICD-10-CM

## 2023-07-06 DIAGNOSIS — M797 Fibromyalgia: Secondary | ICD-10-CM

## 2023-07-06 DIAGNOSIS — M51369 Other intervertebral disc degeneration, lumbar region without mention of lumbar back pain or lower extremity pain: Secondary | ICD-10-CM

## 2023-07-06 DIAGNOSIS — Z8639 Personal history of other endocrine, nutritional and metabolic disease: Secondary | ICD-10-CM | POA: Diagnosis not present

## 2023-07-06 DIAGNOSIS — M810 Age-related osteoporosis without current pathological fracture: Secondary | ICD-10-CM

## 2023-07-06 DIAGNOSIS — M170 Bilateral primary osteoarthritis of knee: Secondary | ICD-10-CM

## 2023-07-06 DIAGNOSIS — Z8719 Personal history of other diseases of the digestive system: Secondary | ICD-10-CM

## 2023-07-06 DIAGNOSIS — M62838 Other muscle spasm: Secondary | ICD-10-CM

## 2023-07-06 DIAGNOSIS — M249 Joint derangement, unspecified: Secondary | ICD-10-CM

## 2023-07-06 LAB — CBC WITH DIFFERENTIAL/PLATELET
Absolute Lymphocytes: 874 {cells}/uL (ref 850–3900)
Absolute Monocytes: 291 {cells}/uL (ref 200–950)
Basophils Absolute: 50 {cells}/uL (ref 0–200)
Basophils Relative: 0.8 %
Eosinophils Absolute: 248 {cells}/uL (ref 15–500)
Eosinophils Relative: 4 %
HCT: 38 % (ref 35.0–45.0)
Hemoglobin: 12.1 g/dL (ref 11.7–15.5)
MCH: 27.3 pg (ref 27.0–33.0)
MCHC: 31.8 g/dL — ABNORMAL LOW (ref 32.0–36.0)
MCV: 85.6 fL (ref 80.0–100.0)
MPV: 13.1 fL — ABNORMAL HIGH (ref 7.5–12.5)
Monocytes Relative: 4.7 %
Neutro Abs: 4737 {cells}/uL (ref 1500–7800)
Neutrophils Relative %: 76.4 %
Platelets: 215 10*3/uL (ref 140–400)
RBC: 4.44 10*6/uL (ref 3.80–5.10)
RDW: 14.5 % (ref 11.0–15.0)
Total Lymphocyte: 14.1 %
WBC: 6.2 10*3/uL (ref 3.8–10.8)

## 2023-07-06 LAB — IRON,TIBC AND FERRITIN PANEL
%SAT: 12 % — ABNORMAL LOW (ref 16–45)
Ferritin: 17 ng/mL (ref 16–288)
Iron: 69 ng/mL (ref 45–288)
TIBC: 560 ug/dL — ABNORMAL HIGH (ref 250–450)

## 2023-07-06 MED ORDER — LIDOCAINE HCL 1 % IJ SOLN
1.0000 mL | INTRAMUSCULAR | Status: AC | PRN
  Administered 2023-07-06: 1 mL

## 2023-07-06 MED ORDER — TRIAMCINOLONE ACETONIDE 40 MG/ML IJ SUSP
40.0000 mg | INTRAMUSCULAR | Status: AC | PRN
  Administered 2023-07-06: 40 mg via INTRA_ARTICULAR

## 2023-07-06 NOTE — Patient Instructions (Signed)
 Sacroiliac Joint Dysfunction: What to Know  Sacroiliac joint dysfunction causes swelling in one or both sides of the sacroiliac (SI) joint. The SI joint is where two bones in the pelvis meet. The two bones are the sacrum, which is the bone at the bottom of your spine, and the ilium, which is the big bone that makes up your hip. SI joint dysfunction can cause a deep ache or burning pain in your lower back. Sometimes, the pain spreads to your butt, hips, or thighs. What are the causes? SI joint dysfunction may be caused by: Pregnancy. When a person is pregnant, extra pressure is put on the SI joints because the pelvis gets wider. Injuries, such as: Car accidents Sports injuries. Work injuries. Having one leg that is shorter than the other. Conditions that affect the joints, such as: Rheumatoid arthritis. Gout. Psoriatic arthritis. Joint infection. Sometimes, the cause of SI joint dysfunction is not known. What are the signs or symptoms? Symptoms of this condition include: Aching or burning pain in the lower back. The pain may also spread to other areas, such as: Butt. Groin. Thighs. Muscle spasms in or around the painful areas. More pain when standing, walking, running, stair climbing, bending, or lifting. How is this diagnosed? Your health care provider will check your medical history and do a physical exam. During the exam, your provider may move your legs to see if it hurts. They may also do tests like: Imaging tests to look for other causes of pain. These may include: MRI. CT scan. Bone scan. Diagnostic injection. Your provider puts numbing medicine into the SI joint. If your pain gets better after this, it may mean you have this condition. How is this treated? Treatment depends on what is causing the problem and how bad it is. Some options include: Ice or heat applied to the lower back area after an injury. This may help reduce pain and muscle spasms. Medicines to help with  pain and swelling, or to relax the muscles. Wearing a back brace, called an SI brace, to support your back while it heals. Physical therapy to help the muscles around the joint be more strong and improve movement. You may also learn how to move your body in ways that are easier on the joint. Direct manipulation. A therapist may gently move the SI joint to help it work better. Using a device that provides electrical stimulation to help reduce pain at the joint. Other treatments may include: Steroid injections. These can help reduce pain and swelling in the joint. Radiofrequency ablation. This uses heat to burn away nerves that are carrying pain messages from the joint. Surgery to put in screws and plates that limit or prevent joint motion. This is rare. Follow these instructions at home: Medicines Take your medicines only as told. You may need to take these steps to help prevent or treat trouble pooping (constipation): Take medicines to help you poop. Eat foods high in fiber, like beans, whole grains, and fresh fruits and vegetables. Drink more fluids as told. Ask your provider if it's safe to drive or use machines while taking your medicine. If you have a brace: Wear the brace as told. Take it off only if your provider says you can. Keep the brace clean. If the brace is not waterproof: Do not let it get wet. Managing pain, stiffness, and swelling     Icing can help with pain and swelling. Heat may help with muscle tension or spasms. Ask your provider if you  should use ice or heat. Use ice or an ice pack as told. Place a towel between your skin and the ice. Leave the ice on for 20 minutes, 2-3 times a day. Use heat as told. Use the heat source that your provider recommends, such as a moist heat pack or a heating pad. Do this as often as told. Place a towel between your skin and the heat source. Leave the heat on for 20-30 minutes. If your skin turns red, take off the ice or heat  right away to prevent skin damage. The risk of damage is higher if you can't feel pain, heat, or cold. General instructions Rest as told. Ask what things are safe for you to do at home. Ask when you can go back to work or school. Exercise as told. Contact a health care provider if: Your pain is not controlled with medicine. You have a fever. Your pain is getting worse. Get help right away if: You have weakness, numbness, or tingling in your legs or feet. You lose control of your bladder or bowels. This information is not intended to replace advice given to you by your health care provider. Make sure you discuss any questions you have with your health care provider. Document Revised: 11/25/2022 Document Reviewed: 11/25/2022 Elsevier Patient Education  2025 [employer].

## 2023-07-06 NOTE — Telephone Encounter (Signed)
 Faxed pharmacy dx codes.

## 2023-07-19 ENCOUNTER — Other Ambulatory Visit: Payer: Self-pay

## 2023-07-19 ENCOUNTER — Inpatient Hospital Stay (HOSPITAL_COMMUNITY)
Admission: EM | Admit: 2023-07-19 | Discharge: 2023-07-22 | DRG: 682 | Disposition: A | Discharge status: Home / Self Care | Attending: Internal Medicine | Admitting: Internal Medicine

## 2023-07-19 ENCOUNTER — Emergency Department (HOSPITAL_COMMUNITY)

## 2023-07-19 DIAGNOSIS — M797 Fibromyalgia: Secondary | ICD-10-CM | POA: Diagnosis present

## 2023-07-19 DIAGNOSIS — R1030 Lower abdominal pain, unspecified: Secondary | ICD-10-CM | POA: Diagnosis not present

## 2023-07-19 DIAGNOSIS — E876 Hypokalemia: Secondary | ICD-10-CM | POA: Diagnosis not present

## 2023-07-19 DIAGNOSIS — Z7951 Long term (current) use of inhaled steroids: Secondary | ICD-10-CM | POA: Diagnosis not present

## 2023-07-19 DIAGNOSIS — Z79899 Other long term (current) drug therapy: Secondary | ICD-10-CM | POA: Diagnosis not present

## 2023-07-19 DIAGNOSIS — E66811 Obesity, class 1: Secondary | ICD-10-CM | POA: Diagnosis not present

## 2023-07-19 DIAGNOSIS — E039 Hypothyroidism, unspecified: Secondary | ICD-10-CM | POA: Diagnosis not present

## 2023-07-19 DIAGNOSIS — Z8249 Family history of ischemic heart disease and other diseases of the circulatory system: Secondary | ICD-10-CM

## 2023-07-19 DIAGNOSIS — E1151 Type 2 diabetes mellitus with diabetic peripheral angiopathy without gangrene: Secondary | ICD-10-CM | POA: Diagnosis not present

## 2023-07-19 DIAGNOSIS — Z9049 Acquired absence of other specified parts of digestive tract: Secondary | ICD-10-CM

## 2023-07-19 DIAGNOSIS — R162 Hepatomegaly with splenomegaly, not elsewhere classified: Secondary | ICD-10-CM | POA: Diagnosis not present

## 2023-07-19 DIAGNOSIS — Z881 Allergy status to other antibiotic agents status: Secondary | ICD-10-CM | POA: Diagnosis not present

## 2023-07-19 DIAGNOSIS — E119 Type 2 diabetes mellitus without complications: Secondary | ICD-10-CM | POA: Diagnosis not present

## 2023-07-19 DIAGNOSIS — Z87891 Personal history of nicotine dependence: Secondary | ICD-10-CM

## 2023-07-19 DIAGNOSIS — Z7983 Long term (current) use of bisphosphonates: Secondary | ICD-10-CM | POA: Diagnosis not present

## 2023-07-19 DIAGNOSIS — Z880 Allergy status to penicillin: Secondary | ICD-10-CM | POA: Diagnosis not present

## 2023-07-19 DIAGNOSIS — R571 Hypovolemic shock: Secondary | ICD-10-CM | POA: Diagnosis not present

## 2023-07-19 DIAGNOSIS — J449 Chronic obstructive pulmonary disease, unspecified: Secondary | ICD-10-CM | POA: Diagnosis not present

## 2023-07-19 DIAGNOSIS — N3000 Acute cystitis without hematuria: Secondary | ICD-10-CM | POA: Diagnosis not present

## 2023-07-19 DIAGNOSIS — R1031 Right lower quadrant pain: Secondary | ICD-10-CM | POA: Diagnosis not present

## 2023-07-19 DIAGNOSIS — Z79890 Hormone replacement therapy (postmenopausal): Secondary | ICD-10-CM | POA: Diagnosis not present

## 2023-07-19 DIAGNOSIS — N179 Acute kidney failure, unspecified: Principal | ICD-10-CM | POA: Diagnosis present

## 2023-07-19 DIAGNOSIS — Z6834 Body mass index (BMI) 34.0-34.9, adult: Secondary | ICD-10-CM

## 2023-07-19 DIAGNOSIS — N390 Urinary tract infection, site not specified: Secondary | ICD-10-CM | POA: Diagnosis not present

## 2023-07-19 DIAGNOSIS — E1165 Type 2 diabetes mellitus with hyperglycemia: Secondary | ICD-10-CM | POA: Diagnosis not present

## 2023-07-19 DIAGNOSIS — E860 Dehydration: Secondary | ICD-10-CM | POA: Diagnosis present

## 2023-07-19 DIAGNOSIS — R109 Unspecified abdominal pain: Secondary | ICD-10-CM | POA: Diagnosis not present

## 2023-07-19 DIAGNOSIS — K5730 Diverticulosis of large intestine without perforation or abscess without bleeding: Secondary | ICD-10-CM | POA: Diagnosis not present

## 2023-07-19 DIAGNOSIS — E871 Hypo-osmolality and hyponatremia: Secondary | ICD-10-CM | POA: Diagnosis present

## 2023-07-19 DIAGNOSIS — I10 Essential (primary) hypertension: Secondary | ICD-10-CM | POA: Diagnosis not present

## 2023-07-19 DIAGNOSIS — E785 Hyperlipidemia, unspecified: Secondary | ICD-10-CM | POA: Diagnosis not present

## 2023-07-19 DIAGNOSIS — Z833 Family history of diabetes mellitus: Secondary | ICD-10-CM | POA: Diagnosis not present

## 2023-07-19 DIAGNOSIS — K5090 Crohn's disease, unspecified, without complications: Secondary | ICD-10-CM | POA: Diagnosis not present

## 2023-07-19 DIAGNOSIS — Z882 Allergy status to sulfonamides status: Secondary | ICD-10-CM

## 2023-07-19 DIAGNOSIS — Z888 Allergy status to other drugs, medicaments and biological substances status: Secondary | ICD-10-CM

## 2023-07-19 LAB — COMPREHENSIVE METABOLIC PANEL WITH GFR
ALT: 21 U/L (ref 0–44)
AST: 32 U/L (ref 15–41)
Albumin: 2.8 g/dL — ABNORMAL LOW (ref 3.5–5.0)
Alkaline Phosphatase: 94 U/L (ref 38–126)
Anion gap: 14 (ref 5–15)
BUN: 46 mg/dL — ABNORMAL HIGH (ref 8–23)
CO2: 19 mmol/L — ABNORMAL LOW (ref 22–32)
Calcium: 7.5 mg/dL — ABNORMAL LOW (ref 8.9–10.3)
Chloride: 96 mmol/L — ABNORMAL LOW (ref 98–111)
Creatinine, Ser: 2.94 mg/dL — ABNORMAL HIGH (ref 0.44–1.00)
GFR, Estimated: 17 mL/min — ABNORMAL LOW (ref >60)
Glucose, Bld: 284 mg/dL — ABNORMAL HIGH (ref 70–99)
Potassium: 3.4 mmol/L — ABNORMAL LOW (ref 3.5–5.1)
Sodium: 129 mmol/L — ABNORMAL LOW (ref 135–145)
Total Bilirubin: 0.9 mg/dL (ref 0.0–1.2)
Total Protein: 7.1 g/dL (ref 6.5–8.1)

## 2023-07-19 LAB — URINALYSIS, ROUTINE W REFLEX MICROSCOPIC
Bilirubin Urine: NEGATIVE
Bilirubin Urine: NEGATIVE
Glucose, UA: NEGATIVE mg/dL
Glucose, UA: NEGATIVE mg/dL
Hgb urine dipstick: NEGATIVE
Ketones, ur: NEGATIVE mg/dL
Ketones, ur: NEGATIVE mg/dL
Nitrite: NEGATIVE
Nitrite: POSITIVE — AB
Protein, ur: 100 mg/dL — AB
Protein, ur: 30 mg/dL — AB
Specific Gravity, Urine: 1.012 (ref 1.005–1.030)
Specific Gravity, Urine: 1.01 (ref 1.005–1.030)
WBC, UA: >50 WBC/hpf (ref 0–5)
pH: 5 (ref 5.0–8.0)
pH: 6 (ref 5.0–8.0)

## 2023-07-19 LAB — CBC WITH DIFFERENTIAL/PLATELET
Abs Immature Granulocytes: 0.06 K/uL (ref 0.00–0.07)
Basophils Absolute: 0 K/uL (ref 0.0–0.1)
Basophils Relative: 0 %
Eosinophils Absolute: 0 K/uL (ref 0.0–0.5)
Eosinophils Relative: 0 %
HCT: 33.2 % — ABNORMAL LOW (ref 36.0–46.0)
Hemoglobin: 10.9 g/dL — ABNORMAL LOW (ref 12.0–15.0)
Immature Granulocytes: 1 %
Lymphocytes Relative: 5 %
Lymphs Abs: 0.5 K/uL — ABNORMAL LOW (ref 0.7–4.0)
MCH: 28.1 pg (ref 26.0–34.0)
MCHC: 32.8 g/dL (ref 30.0–36.0)
MCV: 85.6 fL (ref 80.0–100.0)
Monocytes Absolute: 1.1 K/uL — ABNORMAL HIGH (ref 0.1–1.0)
Monocytes Relative: 10 %
Neutro Abs: 8.6 K/uL — ABNORMAL HIGH (ref 1.7–7.7)
Neutrophils Relative %: 84 %
Platelets: 166 K/uL (ref 150–400)
RBC: 3.88 MIL/uL (ref 3.87–5.11)
RDW: 14.9 % (ref 11.5–15.5)
WBC: 10.3 K/uL (ref 4.0–10.5)
nRBC: 0 % (ref 0.0–0.2)

## 2023-07-19 LAB — HEMOGLOBIN A1C
Hgb A1c MFr Bld: 7.2 % — ABNORMAL HIGH (ref 4.8–5.6)
Mean Plasma Glucose: 159.94 mg/dL

## 2023-07-19 LAB — HIV ANTIBODY (ROUTINE TESTING W REFLEX): HIV Screen 4th Generation wRfx: NONREACTIVE

## 2023-07-19 LAB — GLUCOSE, CAPILLARY
Glucose-Capillary: 131 mg/dL — ABNORMAL HIGH (ref 70–99)
Glucose-Capillary: 117 mg/dL — ABNORMAL HIGH (ref 70–99)

## 2023-07-19 LAB — CBG MONITORING, ED: Glucose-Capillary: 136 mg/dL — ABNORMAL HIGH (ref 70–99)

## 2023-07-19 LAB — TSH: TSH: 3.704 u[IU]/mL (ref 0.350–4.500)

## 2023-07-19 LAB — PROCALCITONIN: Procalcitonin: 17.55 ng/mL

## 2023-07-19 LAB — SEDIMENTATION RATE: Sed Rate: 79 mm/h — ABNORMAL HIGH (ref 0–30)

## 2023-07-19 LAB — LACTIC ACID, PLASMA: Lactic Acid, Venous: 1.7 mmol/L (ref 0.5–1.9)

## 2023-07-19 LAB — PHOSPHORUS: Phosphorus: 3.1 mg/dL (ref 2.5–4.6)

## 2023-07-19 LAB — C-REACTIVE PROTEIN: CRP: 25.6 mg/dL — ABNORMAL HIGH (ref <1.0)

## 2023-07-19 LAB — LIPASE, BLOOD: Lipase: 37 U/L (ref 11–51)

## 2023-07-19 MED ORDER — MORPHINE SULFATE (PF) 4 MG/ML IV SOLN
4.0000 mg | Freq: Once | INTRAVENOUS | Status: DC
  Filled 2023-07-19: qty 1

## 2023-07-19 MED ORDER — LEVOTHYROXINE SODIUM 125 MCG PO TABS
125.0000 ug | ORAL_TABLET | Freq: Every day | ORAL | Status: DC
  Administered 2023-07-20 – 2023-07-22 (×3): 125 ug via ORAL
  Filled 2023-07-19 (×3): qty 1

## 2023-07-19 MED ORDER — ONDANSETRON HCL 4 MG PO TABS: 4.0000 mg | ORAL_TABLET | Freq: Four times a day (QID) | ORAL | Status: DC | PRN

## 2023-07-19 MED ORDER — NOREPINEPHRINE 4 MG/250ML-% IV SOLN
0.0000 ug/min | INTRAVENOUS | Status: DC
  Administered 2023-07-19: 5 ug/min via INTRAVENOUS
  Filled 2023-07-19: qty 250

## 2023-07-19 MED ORDER — NOREPINEPHRINE 4 MG/250ML-% IV SOLN: 0.0000 ug/min | INTRAVENOUS | Status: DC

## 2023-07-19 MED ORDER — FENTANYL CITRATE (PF) 100 MCG/2ML IJ SOLN
100.0000 ug | Freq: Once | INTRAMUSCULAR | Status: AC
  Administered 2023-07-19: 100 ug via INTRAVENOUS
  Filled 2023-07-19: qty 2

## 2023-07-19 MED ORDER — HEPARIN SODIUM (PORCINE) 5000 UNIT/ML IJ SOLN
5000.0000 [IU] | Freq: Three times a day (TID) | INTRAMUSCULAR | Status: DC
  Administered 2023-07-19 – 2023-07-22 (×8): 5000 [IU] via SUBCUTANEOUS
  Filled 2023-07-19 (×8): qty 1

## 2023-07-19 MED ORDER — SODIUM CHLORIDE 0.9 % IV SOLN
250.0000 mL | INTRAVENOUS | Status: AC
  Administered 2023-07-19: 250 mL via INTRAVENOUS

## 2023-07-19 MED ORDER — ACETAMINOPHEN 650 MG RE SUPP: 650.0000 mg | Freq: Four times a day (QID) | RECTAL | Status: DC | PRN

## 2023-07-19 MED ORDER — HYDROMORPHONE HCL 1 MG/ML IJ SOLN
0.5000 mg | INTRAMUSCULAR | Status: DC | PRN
  Administered 2023-07-19: 1 mg via INTRAVENOUS
  Administered 2023-07-19: 0.5 mg via INTRAVENOUS
  Filled 2023-07-19 (×2): qty 1

## 2023-07-19 MED ORDER — DICYCLOMINE HCL 10 MG PO CAPS
10.0000 mg | ORAL_CAPSULE | Freq: Three times a day (TID) | ORAL | Status: DC | PRN
  Administered 2023-07-19: 10 mg via ORAL
  Filled 2023-07-19: qty 1

## 2023-07-19 MED ORDER — SODIUM CHLORIDE 0.9 % IV SOLN: Freq: Once | INTRAVENOUS | Status: AC

## 2023-07-19 MED ORDER — TRAMADOL HCL 50 MG PO TABS
50.0000 mg | ORAL_TABLET | Freq: Three times a day (TID) | ORAL | Status: DC | PRN
  Administered 2023-07-19 – 2023-07-21 (×4): 50 mg via ORAL
  Filled 2023-07-19 (×4): qty 1

## 2023-07-19 MED ORDER — ONDANSETRON HCL 4 MG/2ML IJ SOLN
4.0000 mg | Freq: Once | INTRAMUSCULAR | Status: AC
  Administered 2023-07-19: 4 mg via INTRAVENOUS
  Filled 2023-07-19: qty 2

## 2023-07-19 MED ORDER — PREGABALIN 50 MG PO CAPS
50.0000 mg | ORAL_CAPSULE | Freq: Two times a day (BID) | ORAL | Status: DC
  Administered 2023-07-19 – 2023-07-22 (×7): 50 mg via ORAL
  Filled 2023-07-19: qty 1
  Filled 2023-07-19: qty 2
  Filled 2023-07-19: qty 1
  Filled 2023-07-19: qty 2
  Filled 2023-07-19 (×2): qty 1
  Filled 2023-07-19: qty 2

## 2023-07-19 MED ORDER — INSULIN ASPART 100 UNIT/ML IJ SOLN: 0.0000 [IU] | Freq: Every day | INTRAMUSCULAR | Status: DC

## 2023-07-19 MED ORDER — DULOXETINE HCL 30 MG PO CPEP
30.0000 mg | ORAL_CAPSULE | Freq: Every day | ORAL | Status: DC
  Administered 2023-07-19 – 2023-07-22 (×4): 30 mg via ORAL
  Filled 2023-07-19 (×4): qty 1

## 2023-07-19 MED ORDER — FLUTICASONE FUROATE-VILANTEROL 100-25 MCG/ACT IN AEPB
1.0000 | INHALATION_SPRAY | Freq: Every day | RESPIRATORY_TRACT | Status: DC
  Administered 2023-07-19 – 2023-07-22 (×4): 1 via RESPIRATORY_TRACT
  Filled 2023-07-19 (×2): qty 28

## 2023-07-19 MED ORDER — SODIUM CHLORIDE 0.9 % IV SOLN: INTRAVENOUS | Status: AC

## 2023-07-19 MED ORDER — SODIUM CHLORIDE 0.9 % IV BOLUS
1000.0000 mL | Freq: Once | INTRAVENOUS | Status: AC
  Administered 2023-07-19: 1000 mL via INTRAVENOUS

## 2023-07-19 MED ORDER — INSULIN ASPART 100 UNIT/ML IJ SOLN
0.0000 [IU] | Freq: Three times a day (TID) | INTRAMUSCULAR | Status: DC
  Filled 2023-07-19: qty 1

## 2023-07-19 MED ORDER — ENSURE PLUS HIGH PROTEIN PO LIQD
237.0000 mL | Freq: Two times a day (BID) | ORAL | Status: DC
  Administered 2023-07-19 – 2023-07-20 (×2): 237 mL via ORAL

## 2023-07-19 MED ORDER — SODIUM CHLORIDE 0.9 % IV SOLN
1.0000 g | Freq: Three times a day (TID) | INTRAVENOUS | Status: DC
  Administered 2023-07-20 (×2): 1 g via INTRAVENOUS
  Filled 2023-07-19 (×7): qty 5

## 2023-07-19 MED ORDER — ACETAMINOPHEN 325 MG PO TABS
650.0000 mg | ORAL_TABLET | Freq: Four times a day (QID) | ORAL | Status: DC | PRN
Start: 2023-07-19 — End: 2023-07-22
  Administered 2023-07-20 – 2023-07-21 (×3): 650 mg via ORAL
  Filled 2023-07-19 (×3): qty 2

## 2023-07-19 MED ORDER — ONDANSETRON HCL 4 MG/2ML IJ SOLN
4.0000 mg | Freq: Four times a day (QID) | INTRAMUSCULAR | Status: DC | PRN
Start: 2023-07-19 — End: 2023-07-22

## 2023-07-19 MED ORDER — CHLORHEXIDINE GLUCONATE CLOTH 2 % EX PADS
6.0000 | MEDICATED_PAD | Freq: Every day | CUTANEOUS | Status: DC
  Administered 2023-07-20: 6 via TOPICAL

## 2023-07-19 MED ORDER — ALBUTEROL SULFATE (2.5 MG/3ML) 0.083% IN NEBU
3.0000 mL | INHALATION_SOLUTION | Freq: Four times a day (QID) | RESPIRATORY_TRACT | Status: DC | PRN
  Administered 2023-07-19: 3 mL via RESPIRATORY_TRACT
  Filled 2023-07-19: qty 3

## 2023-07-19 MED ORDER — ROSUVASTATIN CALCIUM 10 MG PO TABS
10.0000 mg | ORAL_TABLET | Freq: Every day | ORAL | Status: DC
  Administered 2023-07-19 – 2023-07-22 (×4): 10 mg via ORAL
  Filled 2023-07-19 (×4): qty 1

## 2023-07-19 MED ORDER — LORATADINE 10 MG PO TABS
10.0000 mg | ORAL_TABLET | Freq: Every day | ORAL | Status: DC
  Administered 2023-07-19 – 2023-07-22 (×4): 10 mg via ORAL
  Filled 2023-07-19 (×4): qty 1

## 2023-07-19 MED ORDER — FENTANYL CITRATE (PF) 100 MCG/2ML IJ SOLN
50.0000 ug | Freq: Once | INTRAMUSCULAR | Status: AC
  Administered 2023-07-19: 50 ug via INTRAVENOUS
  Filled 2023-07-19: qty 2

## 2023-07-19 NOTE — Plan of Care (Signed)

## 2023-07-19 NOTE — TOC CM/SW Note (Signed)
 Transition of Care [facility]) - Inpatient Brief Assessment   Patient Details  Name: Desiree Hogan MRN: [id number] Date of Birth: January 01, 1956  Transition of Care [facility]) CM/SW Contact:    Noreen KATHEE Pinal, LCSWA Phone Number: 07/19/2023, 10:59 AM   Clinical Narrative:  Transition of Care Department [facility]) has reviewed patient and no TOC needs have been identified at this time. We will continue to monitor patient advancement through interdisciplinary progression rounds. If new patient transition needs arise, please place a TOC consult.  Transition of Care Asessment: Insurance and Status: Insurance coverage has been reviewed Patient has primary care physician: Yes Home environment has been reviewed: Single Family Home Prior level of function:: Independent Prior/Current Home Services: No current home services Social Drivers of Health Review: SDOH reviewed no interventions necessary Readmission risk has been reviewed: Yes Transition of care needs: no transition of care needs at this time

## 2023-07-19 NOTE — ED Provider Notes (Signed)
 [place] EMERGENCY DEPARTMENT AT [facility] Provider Note   CSN: [id number] Arrival date & time: 07/19/23  9650     Patient presents with: Abdominal Pain   Desiree Hogan is a 69 y.o. female.   HPI     This is a 68 year old female who presents with abdominal pain and chills.  Patient reports 1 week of progressive symptoms.  Reports lower abdominal pain and back pain consistent with prior Crohn's flares.  She is on an immune modulator.  Has had chills without documented fevers.  Reports explosive diarrhea.  Denies any bloody stools.  Denies any urinary symptoms such as dysuria or hematuria.  Husband reports that she has not had much to eat or drink over the last several days.  Prior to Admission medications   Medication Sig Start Date End Date Taking? Authorizing Provider  albuterol  (VENTOLIN  HFA) 108 (90 Base) MCG/ACT inhaler as needed. 04/17/23   [provider]  alendronate (FOSAMAX) 70 MG tablet Take 70 mg by mouth every Friday. Take with a full glass of water  on an empty stomach.    [provider]  BREO ELLIPTA  100-25 MCG/INH AEPB Inhale 1 puff into the lungs daily.  09/28/15   [provider]  CALCIUM  GLUCONATE PO Take 600 mg by mouth 2 (two) times daily.     [provider]  cetirizine (ZYRTEC) 10 MG tablet Take 10 mg by mouth daily.    [provider]  dicyclomine  (BENTYL ) 10 MG capsule TAKE 1 CAPSULE (10 MG TOTAL) BY MOUTH 3 (THREE) TIMES DAILY AS NEEDED FOR SPASMS. 02/18/21   Golda Claudis PENNER, MD  DULoxetine  (CYMBALTA ) 30 MG capsule TAKE 1 CAPSULE BY MOUTH EVERY DAY 07/02/23   Cheryl Waddell CHRISTELLA, PA-C  EPINEPHrine  0.3 mg/0.3 mL IJ SOAJ injection Inject 0.3 mg into the muscle as needed for anaphylaxis. 01/17/23   Freddi Hamilton, MD  gemfibrozil  (LOPID ) 600 MG tablet Take 600 mg by mouth 2 (two) times daily. 04/30/22   [provider]  levothyroxine  (SYNTHROID , LEVOTHROID) 125 MCG tablet Take 125 mcg by mouth daily.  11/29/16   [provider]  lisinopril (ZESTRIL) 2.5 MG tablet Take 2.5 mg by mouth daily. 09/24/19   [provider]  OVER THE COUNTER MEDICATION [place] multivitamin with Iron daily.    [provider]  OVER THE COUNTER MEDICATION [place] Multivitamin with Fe daily.    [provider]  pregabalin  (LYRICA ) 50 MG capsule TAKE 1 CAPSULE BY MOUTH 2 TIMES DAILY. 06/16/23   Babs Arthea DASEN, MD  rosuvastatin  (CRESTOR ) 10 MG tablet Take 10 mg by mouth daily. 11/19/17   [provider]  tiZANidine  (ZANAFLEX ) 4 MG tablet TAKE 1 TABLET BY MOUTH AT BEDTIME AS NEEDED FOR MUSCLE SPASMS. 05/26/23   Cheryl Waddell CHRISTELLA, PA-C  traMADol  (ULTRAM ) 50 MG tablet Take 1 tablet (50 mg total) by mouth 3 (three) times daily as needed. 06/14/23   Debby Fidela CROME, NP  Vedolizumab  (ENTYVIO  IV) Inject into the vein. Every two months at [facility].    [provider]    Allergies: Bactrim [sulfamethoxazole-trimethoprim], Penicillins, Stelara  [ustekinumab ], Tetracyclines & related, and Ciprofloxacin     Review of Systems  Constitutional:  Negative for fever.  Respiratory:  Negative for shortness of breath.   Cardiovascular:  Negative for chest pain.  Gastrointestinal:  Positive for abdominal pain, diarrhea and nausea. Negative for blood in stool.  All other systems reviewed and are negative.   Updated Vital Signs BP ROLLEN)  79/46   Pulse 75   Temp 98.3 F (36.8 C)   Resp 18   SpO2 94%   Physical Exam Vitals and nursing note reviewed.  Constitutional:      Appearance: She is well-developed.     Comments: Chronically ill-appearing but nontoxic  HENT:     Head: Normocephalic and atraumatic.  Eyes:     Pupils: Pupils are equal, round, and reactive to light.  Cardiovascular:     Rate and Rhythm: Normal rate and regular rhythm.     Heart sounds: Normal heart sounds.  Pulmonary:     Effort: Pulmonary effort is normal. No respiratory distress.      Breath sounds: No wheezing.  Abdominal:     General: Bowel sounds are normal.     Palpations: Abdomen is soft.     Tenderness: There is abdominal tenderness in the right lower quadrant and periumbilical area. There is no guarding or rebound.  Musculoskeletal:     Cervical back: Neck supple.  Skin:    General: Skin is warm and dry.  Neurological:     Mental Status: She is alert and oriented to person, place, and time.     (all labs ordered are listed, but only abnormal results are displayed) Labs Reviewed  CBC WITH DIFFERENTIAL/PLATELET - Abnormal; Notable for the following components:      Result Value   Hemoglobin 10.9 (*)    HCT 33.2 (*)    Neutro Abs 8.6 (*)    Lymphs Abs 0.5 (*)    Monocytes Absolute 1.1 (*)    All other components within normal limits  COMPREHENSIVE METABOLIC PANEL WITH GFR - Abnormal; Notable for the following components:   Sodium 129 (*)    Potassium 3.4 (*)    Chloride 96 (*)    CO2 19 (*)    Glucose, Bld 284 (*)    BUN 46 (*)    Creatinine, Ser 2.94 (*)    Calcium  7.5 (*)    Albumin 2.8 (*)    GFR, Estimated 17 (*)    All other components within normal limits  LIPASE, BLOOD  LACTIC ACID, PLASMA  URINALYSIS, ROUTINE W REFLEX MICROSCOPIC    EKG: None  Radiology: No results found.   .Critical Care  Performed by: Bari Charmaine FALCON, MD Authorized by: Bari Charmaine FALCON, MD   Critical care provider statement:    Critical care time (minutes):  45   Critical care was necessary to treat or prevent imminent or life-threatening deterioration of the following conditions:  Renal failure (Dehydration, hypotension)   Critical care was time spent personally by me on the following activities:  Development of treatment plan with patient or surrogate, discussions with consultants, evaluation of patient's response to treatment, examination of patient, ordering and review of laboratory studies, ordering and review of radiographic studies, ordering and  performing treatments and interventions, pulse oximetry, re-evaluation of patient's condition and review of old charts    Medications Ordered in the ED  ondansetron  (ZOFRAN ) injection 4 mg (4 mg Intravenous Given 07/19/23 0418)  sodium chloride  0.9 % bolus 1,000 mL (0 mLs Intravenous Stopped 07/19/23 0509)  fentaNYL  (SUBLIMAZE ) injection 50 mcg (50 mcg Intravenous Given 07/19/23 0432)  sodium chloride  0.9 % bolus 1,000 mL (0 mLs Intravenous Stopped 07/19/23 0536)  sodium chloride  0.9 % bolus 1,000 mL (1,000 mLs Intravenous New Bag/Given 07/19/23 0548)    Clinical Course as of 07/19/23 0646  Wed Jul 19, 2023  0614 Third liter of fluid infusing.  Repeat blood pressure 87/55. [CH]  0641 Blood pressure continues to improve. [CH]    Clinical Course User Index [CH] Carrington Olazabal, Charmaine FALCON, MD                                 Medical Decision Making Amount and/or Complexity of Data Reviewed Labs: ordered. Radiology: ordered.  Risk Prescription drug management.   This patient presents to the ED for concern of abdominal pain, this involves an extensive number of treatment options, and is a complaint that carries with it a high risk of complications and morbidity.  I considered the following differential and admission for this acute, potentially life threatening condition.  The differential diagnosis includes infection, Crohn's exacerbation, gastritis, appendicitis, cholecystitis  MDM:    This is a 68 year old female who presents with concerns for abdominal pain.  She is nontoxic.  She is initially hypotensive.  Husband states that this happens sometimes.  However on her last rheumatology note her blood pressure was normal.  Patient was started on fluids.  Labs obtained.  Lactate is normal.  No leukocytosis.  She is afebrile which would argue against sepsis.  She does have significant metabolic derangements including AKI with a creatinine of 2.94.  Sodium 129.  Patient was given 2 L of fluids.  Blood  pressure responded slowly but steadily.  She denies any recent steroid use which would put her at risk for adrenal insufficiency.  Given AKI, question prerenal dehydration.  Will obtain CT scan of the abdomen with oral contrast to evaluate for infection or flare.  On multiple repeat evaluations, patient's blood pressure gradually improving.  She is awake and appears well-perfused.    (Labs, imaging, consults)  Labs: I Ordered, and personally interpreted labs.  The pertinent results include: CBC, CMP, lactate, urinalysis, lipase  Imaging Studies ordered: I ordered imaging studies including CT I independently visualized and interpreted imaging. I agree with the radiologist interpretation  Additional history obtained from chart review.  External records from outside source obtained and reviewed including prior evaluations  Cardiac Monitoring: The patient was maintained on a cardiac monitor.  If on the cardiac monitor, I personally viewed and interpreted the cardiac monitored which showed an underlying rhythm of: Sinus  Reevaluation: After the interventions noted above, I reevaluated the patient and found that they have :stayed the same  Social Determinants of Health:  lives independently  Disposition: Pending CT imaging  Co morbidities that complicate the patient evaluation  Past Medical History:  Diagnosis Date   Abdominal pain    Anemia    Back pain    Colitis    COPD (chronic obstructive pulmonary disease) (HCC)    not on home o2   Crohn's disease (HCC) 03/29/12   Diabetes mellitus without complication (HCC)    Fibromyalgia    H/O breast biopsy    Hip pain    Hyperlipidemia    Hypothyroidism    Irritable bowel syndrome    Joint pain    Nonspecific abnormal finding in stool contents    Peripheral vascular disease (HCC)    Ulcer    Mouth     Medicines Meds ordered this encounter  Medications   DISCONTD: morphine  (PF) 4 MG/ML injection 4 mg   ondansetron  (ZOFRAN )  injection 4 mg   sodium chloride  0.9 % bolus 1,000 mL   fentaNYL  (SUBLIMAZE ) injection 50 mcg   sodium chloride  0.9 % bolus 1,000 mL  sodium chloride  0.9 % bolus 1,000 mL    I have reviewed the patients home medicines and have made adjustments as needed  Problem List / ED Course: Problem List Items Addressed This Visit       Other   Abdominal pain   Other Visit Diagnoses       AKI (acute kidney injury) (HCC)    -  Primary                Final diagnoses:  AKI (acute kidney injury) (HCC)  Lower abdominal pain    ED Discharge Orders     None          Bari Charmaine FALCON, MD 07/19/23 [phone]

## 2023-07-19 NOTE — ED Provider Notes (Signed)
  Physical Exam  BP (!) 105/58   Pulse 73   Temp 98.4 F (36.9 C) (Oral)   Resp (!) 23   SpO2 (!) 84%   Physical Exam Vitals and nursing note reviewed.  Constitutional:      General: She is not in acute distress.    Appearance: She is well-developed.  HENT:     Head: Normocephalic and atraumatic.  Eyes:     Conjunctiva/sclera: Conjunctivae normal.  Cardiovascular:     Rate and Rhythm: Normal rate and regular rhythm.     Heart sounds: No murmur heard. Pulmonary:     Effort: Pulmonary effort is normal. No respiratory distress.     Breath sounds: Normal breath sounds.  Abdominal:     Palpations: Abdomen is soft.     Tenderness: There is generalized abdominal tenderness.  Musculoskeletal:        General: No swelling.     Cervical back: Neck supple.  Skin:    General: Skin is warm and dry.     Capillary Refill: Capillary refill takes less than 2 seconds.  Neurological:     Mental Status: She is alert.  Psychiatric:        Mood and Affect: Mood normal.     Procedures  .Critical Care  Performed by: Albertina Dixon, MD Authorized by: Albertina Dixon, MD   Critical care provider statement:    Critical care time (minutes):  30   Critical care was necessary to treat or prevent imminent or life-threatening deterioration of the following conditions:  Shock   Critical care was time spent personally by me on the following activities:  Development of treatment plan with patient or surrogate, discussions with consultants, evaluation of patient's response to treatment, examination of patient, ordering and review of laboratory studies, ordering and review of radiographic studies, ordering and performing treatments and interventions, pulse oximetry, re-evaluation of patient's condition and review of old charts   ED Course / MDM   Clinical Course as of 07/19/23 0931  Wed Jul 19, 2023  0614 Third liter of fluid infusing.  Repeat blood pressure 87/55. [CH]  0641 Blood pressure  continues to improve. [CH]    Clinical Course User Index [CH] Horton, Charmaine FALCON, MD   Medical Decision Making Amount and/or Complexity of Data Reviewed Labs: ordered. Radiology: ordered.  Risk Prescription drug management. Decision regarding hospitalization.   Patient received in handoff.  History of Crohn's disease with abdominal pain and diarrhea.  Persistently hypotensive despite 3 L of fluid.  Receiving fourth liter of fluid and pending CT scan results at time of signout.  CT largely unremarkable.  Blood pressure is not improving and patient started on peripheral Levophed .  Blood pressure improved with Levophed  and patient require hospital admission for hypotension.       Albertina Dixon, MD 07/19/23 [phone]

## 2023-07-19 NOTE — H&P (Signed)
 History and Physical    Patient: Desiree Hogan FMW:[id number] DOB: 08-Mar-1955 DOA: 07/19/2023 DOS: the patient was seen and examined on 07/19/2023 PCP: Rosamond Leta NOVAK, MD   Patient coming from: Home  Chief Complaint:  Chief Complaint  Patient presents with   Abdominal Pain   HPI: Desiree Hogan is a 68 y.o. female with medical history significant of history of COPD, fibromyalgia, hypothyroidism, hypertension, hyperlipidemia, history of chron's disease, IBS and obesity; who presented to the hospital secondary to abdominal pain and feeling weak.  Patient expressed over the last week experiencing some intermittent abdominal cramps with associated diarrhea and inability to properly maintain adequate hydration/nutrition due to poor intestinal absorption.    Patient denies any melena, hematochezia, fever/chills, sick contacts, chest pain, shortness of breath, dysuria/hematuria, focal weaknesses or any other complaints.  Patient has continued to be compliant with all her meds prior to admission.  Workup in the ED demonstrating hypertension and acute kidney injury.  No elevation of WBCs appreciated and a CT abdomen with oral contrast demonstrating no diverticulitis or significant inflammatory changes to suggest acute Crohn's flare.  4 L of IV fluid resuscitation has been provided and patient blood pressure remained low; peripheral Levophed  has been started and [facility] contacted to place patient in the hospital for further evaluation and management.   Review of Systems: As mentioned in the history of present illness. All other systems reviewed and are negative. Past Medical History:  Diagnosis Date   Abdominal pain    Anemia    Back pain    Colitis    COPD (chronic obstructive pulmonary disease) (HCC)    not on home o2   Crohn's disease (HCC) 03/29/12   Diabetes mellitus without complication (HCC)    Fibromyalgia    H/O breast biopsy    Hip pain    Hyperlipidemia    Hypothyroidism    Irritable  bowel syndrome    Joint pain    Nonspecific abnormal finding in stool contents    Peripheral vascular disease (HCC)    Ulcer    Mouth   Past Surgical History:  Procedure Laterality Date   BIOPSY  12/20/2017   Procedure: BIOPSY;  Surgeon: Golda Claudis PENNER, MD;  Location: [facility];  Service: Endoscopy;;  cecal erosions   BIOPSY  11/11/2022   Procedure: BIOPSY;  Surgeon: Eartha Angelia Sieving, MD;  Location: [facility];  Service: Gastroenterology;;   CESAREAN SECTION  06/24/82   CHOLECYSTECTOMY  1993   Gall Bladder   COLONOSCOPY  Jan. 31, 2014   COLONOSCOPY N/A 12/20/2017   Procedure: COLONOSCOPY;  Surgeon: Golda Claudis PENNER, MD;  Location: [facility];  Service: Endoscopy;  Laterality: N/A;  2:25   COLONOSCOPY WITH PROPOFOL  N/A 11/11/2022   Procedure: COLONOSCOPY WITH PROPOFOL ;  Surgeon: Eartha Angelia Sieving, MD;  Location: [facility];  Service: Gastroenterology;  Laterality: N/A;  9:30AM;ASA 3   POLYPECTOMY  11/11/2022   Procedure: POLYPECTOMY;  Surgeon: Eartha Angelia Sieving, MD;  Location: [facility];  Service: Gastroenterology;;   SPINE SURGERY  2010 and 2011   TONSILLECTOMY     Social History:  reports that she quit smoking about 14 years ago. Her smoking use included cigarettes. She started smoking about 34 years ago. She has a 15 pack-year smoking history. She has never been exposed to tobacco smoke. She has never used smokeless tobacco. She reports that she does not drink alcohol  and does not use drugs.  Allergies  Allergen Reactions  Bactrim [Sulfamethoxazole-Trimethoprim] Itching    And sores   Penicillins Anaphylaxis    Immediate rash, facial/tongue/throat swelling, SOB or lightheadedness with hypotension Severe rash involving mucus membranes or skin necrosis    Stelara  [Ustekinumab ] Nausea Only and Rash    Sweating, flushing, possible anaphylaxis   Tetracyclines & Related Swelling and Rash   Ciprofloxacin      Blisters and facial swelling     Family History  Problem Relation Age of Onset   Cancer Mother        Colon or vaginal ?   Deep vein thrombosis Mother    Hypertension Father    Diabetes Father    Heart disease Father        Heart Disease before age 19   Diabetes Sister    Diabetes Brother    Hypertension Brother    Healthy Daughter    Healthy Daughter    Healthy Daughter    Healthy Son    Breast cancer Neg Hx     Prior to Admission medications   Medication Sig Start Date End Date Taking? Authorizing Provider  albuterol  (VENTOLIN  HFA) 108 (90 Base) MCG/ACT inhaler as needed. 04/17/23  Yes [provider]  BREO ELLIPTA  100-25 MCG/INH AEPB Inhale 1 puff into the lungs daily.  09/28/15  Yes [provider]  CALCIUM  GLUCONATE PO Take 600 mg by mouth 2 (two) times daily.    Yes [provider]  cetirizine (ZYRTEC) 10 MG tablet Take 10 mg by mouth daily.   Yes [provider]  dicyclomine  (BENTYL ) 10 MG capsule TAKE 1 CAPSULE (10 MG TOTAL) BY MOUTH 3 (THREE) TIMES DAILY AS NEEDED FOR SPASMS. 02/18/21  Yes Rehman, Claudis PENNER, MD  DULoxetine  (CYMBALTA ) 30 MG capsule TAKE 1 CAPSULE BY MOUTH EVERY DAY 07/02/23  Yes Cheryl Waddell HERO, PA-C  EPINEPHrine  0.3 mg/0.3 mL IJ SOAJ injection Inject 0.3 mg into the muscle as needed for anaphylaxis. 01/17/23  Yes Freddi Hamilton, MD  gemfibrozil  (LOPID ) 600 MG tablet Take 600 mg by mouth 2 (two) times daily. 04/30/22  Yes [provider]  levothyroxine  (SYNTHROID , LEVOTHROID) 125 MCG tablet Take 125 mcg by mouth daily. 11/29/16  Yes [provider]  lisinopril (ZESTRIL) 2.5 MG tablet Take 2.5 mg by mouth daily. 09/24/19  Yes [provider]  OVER THE COUNTER MEDICATION [place] multivitamin with Iron daily.   Yes [provider]  pregabalin  (LYRICA ) 50 MG capsule TAKE 1 CAPSULE BY MOUTH 2 TIMES DAILY. 06/16/23  Yes Babs Arthea DASEN, MD  rosuvastatin  (CRESTOR ) 10 MG tablet Take 10 mg by mouth daily. 11/19/17  Yes  [provider]  tiZANidine  (ZANAFLEX ) 4 MG tablet TAKE 1 TABLET BY MOUTH AT BEDTIME AS NEEDED FOR MUSCLE SPASMS. 05/26/23  Yes Cheryl Waddell HERO, PA-C  traMADol  (ULTRAM ) 50 MG tablet Take 1 tablet (50 mg total) by mouth 3 (three) times daily as needed. 06/14/23  Yes Debby Fidela CROME, NP  Vedolizumab  (ENTYVIO  IV) Inject into the vein. Every two months at [facility].   Yes [provider]  alendronate (FOSAMAX) 70 MG tablet Take 70 mg by mouth every Friday. Take with a full glass of water  on an empty stomach.    [provider]  OVER THE COUNTER MEDICATION [place] Multivitamin with Fe daily.    [provider]    Physical Exam: Vitals:   07/19/23 0720 07/19/23 0730 07/19/23 0736 07/19/23 0800  BP: (!) 70/39 (!) 112/51 (!) 98/58 113/75  Pulse: 74 68 75  68  Resp: (!) 21 (!) 23 (!) 21 19  Temp:   98.4 F (36.9 C)   TempSrc:   Oral   SpO2: 92% 95% 90% 94%   General exam: Alert, awake, oriented x 3; afebrile, reporting no nausea vomiting. Respiratory system: Saturation on room air. Cardiovascular system:RRR. No rubs or gallops; no JVD. Gastrointestinal system: Abdomen is obese, soft, without guarding and reporting vague diffuse tenderness on palpation.  Positive bowel sounds appreciated.   Central nervous system: No focal neurological deficits. Extremities: No cyanosis or clubbing. Skin: No petechiae. Psychiatry: Judgement and insight appear normal. Mood & affect appropriate.   Data Reviewed: Urinalysis demonstrating 100 protein, negative nitrite and moderate leukocyte esterase; patient denies dysuria. Comprehensive metabolic panel: Sodium 129, potassium 3.4, chloride 96, bicarb 19, glucose 284, BUN 46, creatinine 2.94, normal LFTs and GFR 17 CBC: WBCs 10.3, hemoglobin 10.9 and platelet count 1 66K  Assessment and Plan: 1-acute kidney injury - Appears to be secondary to prerenal azotemia and dehydration - Continue use of antihypertensive  agents/nephrotoxic drugs prior to admission contributing to overall - Patient also with hypovolemic shock in the setting of GI losses. -Continue fluid resuscitation - Minimize nephrotoxic agents - Avoid the use of contrast and hypotension - Follow renal function trend - CT abdomen and pelvis ruling out the presence of obstructive  2-hypovolemic shock - Continue fluid resuscitation - Peripheral IV pressors will be provided - Follow vital signs - Holding antihypertensive agents in the setting of hypotension.  3-hyponatremia/hypokalemia - In the setting of GI losses and dehydration - Continue fluid resuscitation - Follow electrolytes trend. - Will replete electrolytes - Continue telemetry monitoring.  4-hypertension - Holding antihypertensive agents in the setting of hypotension - Follow vital signs.  5-fibromyalgia - Continue the use of Cymbalta , Lyrica  and as needed analgesics.  6-hyperlipidemia - Continue statin  7-hypothyroidism - Continue Synthroid  - Obtain TSH  8-history of COPD - No acute exacerbation currently appreciated - Continue the use of Breo Ellipta  and as needed bronchodilators  9-history of Crohn's disease - Will continue the use of as needed Bentyl  - No inflammatory changes appreciated on CT scan - Will check ESR/CRP and also assess stool studies - Continue fluid resuscitation and supportive care.    Advance Care Planning:   Code Status: Full Code   Consults: None  Family Communication: Husband at bedside.  Severity of Illness: The appropriate patient status for this patient is INPATIENT. Inpatient status is judged to be reasonable and necessary in order to provide the required intensity of service to ensure the patient's safety. The patient's presenting symptoms, physical exam findings, and initial radiographic and laboratory data in the context of their chronic comorbidities is felt to place them at high risk for further clinical deterioration.  Furthermore, it is not anticipated that the patient will be medically stable for discharge from the hospital within 2 midnights of admission.   * I certify that at the point of admission it is my clinical judgment that the patient will require inpatient hospital care spanning beyond 2 midnights from the point of admission due to high intensity of service, high risk for further deterioration and high frequency of surveillance required.*  Author: Eric Nunnery, MD 07/19/2023 8:46 AM  For on call review [email or web address].

## 2023-07-19 NOTE — ED Notes (Signed)
 Pts oxygen titrated up from 2 L to 4 L via [place] due to SpO2 dropping to 86-89%, SpO2 now 93% on 4 L [place]

## 2023-07-19 NOTE — ED Notes (Signed)
 Spoke with Respiratory regarding inhaler due @ 10. RT said they will take care of it once pt is moved to her room upstairs as many have gotten lost in the past so they wait till pt is roomed first to administer.

## 2023-07-19 NOTE — ED Triage Notes (Signed)
 Pt c/o abdominal pain and chills x1 week. Started having diarrhea yesterday and increased pain. Hx of  Crohn's Disease

## 2023-07-19 NOTE — ED Notes (Signed)
 Patient transported to CT

## 2023-07-19 NOTE — Progress Notes (Signed)
 With positive UA earlier today, family inquiring about antibiotics on this patient, I have obtained procalcitonin which was significantly elevated at 17, she is having multiple allergies to antibiotics, discussed with pharmacy, given her allergy best option will be aztreonam , pharmacy consulted to start. Brayton Lye MD

## 2023-07-20 DIAGNOSIS — R1030 Lower abdominal pain, unspecified: Secondary | ICD-10-CM | POA: Diagnosis not present

## 2023-07-20 DIAGNOSIS — N3000 Acute cystitis without hematuria: Secondary | ICD-10-CM | POA: Diagnosis not present

## 2023-07-20 DIAGNOSIS — N179 Acute kidney failure, unspecified: Secondary | ICD-10-CM | POA: Diagnosis not present

## 2023-07-20 LAB — BASIC METABOLIC PANEL WITH GFR
Anion gap: 9 (ref 5–15)
BUN: 30 mg/dL — ABNORMAL HIGH (ref 8–23)
CO2: 16 mmol/L — ABNORMAL LOW (ref 22–32)
Calcium: 7 mg/dL — ABNORMAL LOW (ref 8.9–10.3)
Chloride: 108 mmol/L (ref 98–111)
Creatinine, Ser: 1.68 mg/dL — ABNORMAL HIGH (ref 0.44–1.00)
GFR, Estimated: 33 mL/min — ABNORMAL LOW (ref >60)
Glucose, Bld: 128 mg/dL — ABNORMAL HIGH (ref 70–99)
Potassium: 3.4 mmol/L — ABNORMAL LOW (ref 3.5–5.1)
Sodium: 133 mmol/L — ABNORMAL LOW (ref 135–145)

## 2023-07-20 LAB — GLUCOSE, CAPILLARY
Glucose-Capillary: 130 mg/dL — ABNORMAL HIGH (ref 70–99)
Glucose-Capillary: 145 mg/dL — ABNORMAL HIGH (ref 70–99)
Glucose-Capillary: 142 mg/dL — ABNORMAL HIGH (ref 70–99)
Glucose-Capillary: 152 mg/dL — ABNORMAL HIGH (ref 70–99)

## 2023-07-20 LAB — MAGNESIUM: Magnesium: 1.9 mg/dL (ref 1.7–2.4)

## 2023-07-20 LAB — MRSA NEXT GEN BY PCR, NASAL: MRSA by PCR Next Gen: NOT DETECTED

## 2023-07-20 MED ORDER — SODIUM CHLORIDE 0.9 % IV SOLN
1.0000 g | INTRAVENOUS | Status: DC
  Administered 2023-07-20: 1 g via INTRAVENOUS
  Filled 2023-07-20: qty 10

## 2023-07-20 MED ORDER — PHENOL 1.4 % MT LIQD
1.0000 | OROMUCOSAL | Status: DC | PRN
  Administered 2023-07-20: 1 via OROMUCOSAL
  Filled 2023-07-20: qty 177

## 2023-07-20 MED ORDER — POTASSIUM CHLORIDE CRYS ER 20 MEQ PO TBCR
40.0000 meq | EXTENDED_RELEASE_TABLET | Freq: Once | ORAL | Status: AC
  Administered 2023-07-20: 40 meq via ORAL
  Filled 2023-07-20: qty 2

## 2023-07-20 NOTE — Plan of Care (Signed)
   Problem: Coping: Goal: Ability to adjust to condition or change in health will improve Outcome: Progressing

## 2023-07-20 NOTE — Progress Notes (Signed)
 Progress Note   Patient: Desiree Hogan FMW:[id number] DOB: [birth year] DOA: 07/19/2023     1 DOS: the patient was seen and examined on 07/20/2023   Brief hospital admission narrative:  Desiree Hogan is a 68 y.o. female with medical history significant of history of COPD, fibromyalgia, hypothyroidism, hypertension, hyperlipidemia, history of chron's disease, IBS and obesity; who presented to the hospital secondary to abdominal pain and feeling weak.  Patient expressed over the last week experiencing some intermittent abdominal cramps with associated diarrhea and inability to properly maintain adequate hydration/nutrition due to poor intestinal absorption.     Patient denies any melena, hematochezia, fever/chills, sick contacts, chest pain, shortness of breath, dysuria/hematuria, focal weaknesses or any other complaints.   Patient has continued to be compliant with all her meds prior to admission.  Workup in the ED demonstrating hypertension and acute kidney injury.  No elevation of WBCs appreciated and a CT abdomen with oral contrast demonstrating no diverticulitis or significant inflammatory changes to suggest acute Crohn's flare.   4 L of IV fluid resuscitation has been provided and patient blood pressure remained low; peripheral Levophed  has been started and TRH contacted to place patient in the hospital for further evaluation and management.  Assessment and plan 1-acute kidney injury - Appears to be secondary to prerenal azotemia and dehydration - Continue minimizing the use of nephrotoxic agents - Maintain adequate hydration - Follow renal function trend - Avoid the use of contrast and avoid hypotension.  2-hypovolemic shock - Appears to be associated with GI losses and poor oral intake - Initially requiring the use of levo peripherally - Currently off pressors - Continue fluid resuscitation - No nausea vomiting - Patient reports improvement in prior to admission diarrhea  description. - Continue to follow vital sign - Continue holding antihypertensive agents.  3-hyponatremia/hypokalemia - In the setting of dehydration and GI losses - Continue maintaining adequate hydration and continue to replete electrolyte - Will continue telemetry monitoring.  4-hyperglycemia without prior history of diabetes - A1c 7.2 - Discussed with patient regarding meeting criteria for type 2 diabetes - Patient declining the use of insulin  at the moment - Modified carbohydrate diet has been discussed with patient - Close outpatient follow-up to determine treatment with oral hypoglycemic management.  5-fibromyalgia - Continue treatment with Cymbalta , Lyrica  and as needed analgesics.  6-hyperlipidemia - Continue statin.  7-history of COPD - Very little wheezing appreciated on exam - Continue the use of Breo Ellipta  and as needed bronchodilator - No need for any steroids at the moment.  8-presumed UTI - Follow culture results - Maintain adequate hydration - Empirically receiving antibiotics.  9-history of Crohn's disease - Continue as needed pain total - No inflammatory changes appreciated on CT scan - CRP 25.6 and ESR 79 - Procalcitonin 17.5 - Continue outpatient follow-up with GI service.  10-hypothyroidism Continue Synthroid .  11-class I obesity -Body mass index is 34.48 kg/m.  -Low-calorie diet and portion control discussed with patient.  Subjective:  In no major distress; denies chest pain and shortness of breath.  Patient complaining of increased frequency and is still intermittent vague abdominal discomfort.  Physical Exam: Vitals:   07/20/23 1400 07/20/23 1410 07/20/23 1600 07/20/23 1716  BP: (!) 130/50  (!) 124/96   Pulse: (!) 104 (!) 103 (!) 103 (!) 103  Resp: (!) 24 (!) 24 (!) 24 (!) 25  Temp:    99.1 F (37.3 C)  TempSrc:    Oral  SpO2: 91% 92% 95% 94%  Weight:      Height:       General exam: Alert, awake, oriented x 3; reporting  increased frequency and intermittent abdominal discomfort.  No further nausea or vomiting.  Tolerating diet. Respiratory system: Clear to auscultation. Respiratory effort normal.  Good saturation on room air. Cardiovascular system: Rate controlled, no rubs, no gallops, no JVD. Gastrointestinal system: Abdomen is obese, nondistended, without guarding.  Positive bowel sounds. Central nervous system:  No focal neurological deficits. Extremities: No cyanosis or clubbing. Skin: No rashes, lesions or ulcers Psychiatry: Judgement and insight appear normal. Mood & affect appropriate.    Data Reviewed: Magnesium: 1.9 Basic metabolic panel: Sodium 133, potassium 3.4, chloride 108, bicarb 16, BUN 30, creatinine 1.68 and GFR 33  Family Communication: Daughter at bedside.  Disposition: Status is: Inpatient Remains inpatient appropriate because: Continue IV therapy.  Anticipating discharge back home once medically stable. Time spent: 50 minutes  Author: Eric Nunnery, MD 07/20/2023 5:45 PM  For on call review [email or web address].

## 2023-07-21 ENCOUNTER — Telehealth (INDEPENDENT_AMBULATORY_CARE_PROVIDER_SITE_OTHER): Payer: Self-pay

## 2023-07-21 DIAGNOSIS — N3000 Acute cystitis without hematuria: Secondary | ICD-10-CM | POA: Diagnosis not present

## 2023-07-21 DIAGNOSIS — N179 Acute kidney failure, unspecified: Secondary | ICD-10-CM | POA: Diagnosis not present

## 2023-07-21 DIAGNOSIS — R1030 Lower abdominal pain, unspecified: Secondary | ICD-10-CM | POA: Diagnosis not present

## 2023-07-21 LAB — GLUCOSE, CAPILLARY
Glucose-Capillary: 136 mg/dL — ABNORMAL HIGH (ref 70–99)
Glucose-Capillary: 124 mg/dL — ABNORMAL HIGH (ref 70–99)
Glucose-Capillary: 144 mg/dL — ABNORMAL HIGH (ref 70–99)
Glucose-Capillary: 148 mg/dL — ABNORMAL HIGH (ref 70–99)

## 2023-07-21 LAB — BASIC METABOLIC PANEL WITH GFR
Anion gap: 9 (ref 5–15)
BUN: 19 mg/dL (ref 8–23)
CO2: 20 mmol/L — ABNORMAL LOW (ref 22–32)
Calcium: 8 mg/dL — ABNORMAL LOW (ref 8.9–10.3)
Chloride: 107 mmol/L (ref 98–111)
Creatinine, Ser: 1.1 mg/dL — ABNORMAL HIGH (ref 0.44–1.00)
GFR, Estimated: 55 mL/min — ABNORMAL LOW (ref >60)
Glucose, Bld: 132 mg/dL — ABNORMAL HIGH (ref 70–99)
Potassium: 4.2 mmol/L (ref 3.5–5.1)
Sodium: 136 mmol/L (ref 135–145)

## 2023-07-21 MED ORDER — SODIUM CHLORIDE 0.9 % IV SOLN
1.0000 g | INTRAVENOUS | Status: DC
  Administered 2023-07-21: 1 g via INTRAVENOUS
  Filled 2023-07-21: qty 10

## 2023-07-21 NOTE — Plan of Care (Signed)
   Problem: Education: Goal: Ability to describe self-care measures that may prevent or decrease complications (Diabetes Survival Skills Education) will improve Outcome: Progressing

## 2023-07-21 NOTE — Telephone Encounter (Signed)
 I spoke with the patient spouse Ubaldo and made him aware per Dr. Eartha, looks as if infection if improving and patient will need to proceed with the scheduled infusion in late August. Made him aware the patient will need to call the office for an appointment here in the office after discharged. He stated understanding of all.

## 2023-07-21 NOTE — Telephone Encounter (Signed)
 Hi, the hospitalist reached me today regarding her case as well. Seems her infection is improving. She should be good to proceed with Entyvio  in late August. She should schedule a follow up appointment once she leaves the hospital. Thanks

## 2023-07-21 NOTE — Progress Notes (Signed)
 Progress Note   Patient: Desiree Hogan FMW:[id number] DOB: Jul 19, 1955 DOA: 07/19/2023     2 DOS: the patient was seen and examined on 07/21/2023   Brief hospital admission narrative:  MICKELLE GOUPIL is a 68 y.o. female with medical history significant of history of COPD, fibromyalgia, hypothyroidism, hypertension, hyperlipidemia, history of chron's disease, IBS and obesity; who presented to the hospital secondary to abdominal pain and feeling weak.  Patient expressed over the last week experiencing some intermittent abdominal cramps with associated diarrhea and inability to properly maintain adequate hydration/nutrition due to poor intestinal absorption.     Patient denies any melena, hematochezia, fever/chills, sick contacts, chest pain, shortness of breath, dysuria/hematuria, focal weaknesses or any other complaints.   Patient has continued to be compliant with all her meds prior to admission.  Workup in the ED demonstrating hypertension and acute kidney injury.  No elevation of WBCs appreciated and a CT abdomen with oral contrast demonstrating no diverticulitis or significant inflammatory changes to suggest acute Crohn's flare.   4 L of IV fluid resuscitation has been provided and patient blood pressure remained low; peripheral Levophed  has been started and TRH contacted to place patient in the hospital for further evaluation and management.  Assessment and plan 1-acute kidney injury - Appears to be secondary to prerenal azotemia and dehydration - Continue minimizing the use of nephrotoxic agents - Maintain adequate hydration - Follow renal function trend - Continue to avoid the use of contrast and avoid hypotension.  2-hypovolemic shock - Appears to be associated with GI losses and poor oral intake - Initially requiring the use of levo peripherally - Currently off pressors - Continue fluid resuscitation - No nausea vomiting - Patient reports improvement in prior to admission diarrhea  description. - Continue to follow vital sign - Continue holding antihypertensive agents.  3-hyponatremia/hypokalemia - In the setting of dehydration and GI losses - Continue maintaining adequate hydration and continue to replete electrolyte - Will continue telemetry monitoring.  4-hyperglycemia without prior history of diabetes - A1c 7.2 - Discussed with patient regarding meeting criteria for type 2 diabetes - Patient declining the use of insulin  at the moment - Modified carbohydrate diet has been discussed with patient - Close outpatient follow-up to determine treatment with oral hypoglycemic management.  5-fibromyalgia - Continue treatment with Cymbalta , Lyrica  and as needed analgesics.  6-hyperlipidemia - Continue statin.  7-history of COPD - Very little wheezing appreciated on exam - Continue the use of Breo Ellipta  and as needed bronchodilator - No need for any steroids at the moment.  8-presumed UTI - Follow culture results - Maintain adequate hydration - Empirically receiving antibiotics.  9-history of Crohn's disease - Continue as needed pain total - No inflammatory changes appreciated on CT scan - CRP 25.6 and ESR 79 - Procalcitonin 17.5 - Continue outpatient follow-up with GI service.  10-hypothyroidism Continue Synthroid .  11-class I obesity -Body mass index is 34.48 kg/m.  -Low-calorie diet and portion control discussed with patient.  Subjective:  In no acute distress; reporting some headaches, nausea and not feeling well.  Patient is afebrile and there was no overnight events.  Physical Exam: Vitals:   07/21/23 0535 07/21/23 0737 07/21/23 0954 07/21/23 1254  BP: (!) 148/72  136/62 115/61  Pulse: 88  90 93  Resp:      Temp: 98.8 F (37.1 C)  98.5 F (36.9 C) 98.6 F (37 C)  TempSrc: Oral  Oral Oral  SpO2: 99% 98% 94% 98%  Weight:  Height:       General exam: Alert, awake, oriented x 3; reporting some nausea and headaches. Respiratory  system: Good saturation on room air.  Positive scattered rhonchi.  No using accessory muscles. Cardiovascular system: Rate controlled, no rubs, no gallops, no JVD. Gastrointestinal system: Abdomen is obese, nondistended, soft and without guarding and demonstrating positive bowel sounds. Central nervous system:  No focal neurological deficits. Extremities: No cyanosis or clubbing. Skin: No petechiae. Psychiatry: Judgement and insight appear normal.  Flat affect appreciated on exam.   Latest data Reviewed: Magnesium: 1.9 Basic metabolic panel: Sodium 136, potassium 4.2, chloride 20, BUN 19, creatinine 1.10 and GFR 55.  Family Communication: Daughter at bedside.  Disposition: Status is: Inpatient Remains inpatient appropriate because: Continue IV therapy.  Anticipating discharge back home once medically stable. Time spent: 50 minutes  Author: Eric Nunnery, MD 07/21/2023 3:45 PM  For on call review [email or web address].

## 2023-07-21 NOTE — Telephone Encounter (Signed)
 Patient spouse came by the office today to report patient in the hospital with an infection. He would like for you to review her records as the patient has an upcoming Entyvio  infusion on 08/30/2023 and they told her at the hospital this may need to cancelled and possible medication change. Please advise.

## 2023-07-22 DIAGNOSIS — E119 Type 2 diabetes mellitus without complications: Secondary | ICD-10-CM | POA: Diagnosis not present

## 2023-07-22 DIAGNOSIS — R1030 Lower abdominal pain, unspecified: Secondary | ICD-10-CM | POA: Diagnosis not present

## 2023-07-22 DIAGNOSIS — N179 Acute kidney failure, unspecified: Secondary | ICD-10-CM | POA: Diagnosis not present

## 2023-07-22 LAB — BASIC METABOLIC PANEL WITH GFR
Anion gap: 9 (ref 5–15)
BUN: 17 mg/dL (ref 8–23)
CO2: 22 mmol/L (ref 22–32)
Calcium: 8.4 mg/dL — ABNORMAL LOW (ref 8.9–10.3)
Chloride: 106 mmol/L (ref 98–111)
Creatinine, Ser: 0.99 mg/dL (ref 0.44–1.00)
GFR, Estimated: >60 mL/min (ref >60)
Glucose, Bld: 123 mg/dL — ABNORMAL HIGH (ref 70–99)
Potassium: 3.8 mmol/L (ref 3.5–5.1)
Sodium: 137 mmol/L (ref 135–145)

## 2023-07-22 LAB — URINE CULTURE: Culture: >=100000 — AB

## 2023-07-22 LAB — GLUCOSE, CAPILLARY
Glucose-Capillary: 135 mg/dL — ABNORMAL HIGH (ref 70–99)
Glucose-Capillary: 158 mg/dL — ABNORMAL HIGH (ref 70–99)

## 2023-07-22 MED ORDER — CEFADROXIL 500 MG PO CAPS
500.0000 mg | ORAL_CAPSULE | Freq: Two times a day (BID) | ORAL | Status: DC
  Administered 2023-07-22: 500 mg via ORAL
  Filled 2023-07-22 (×3): qty 1

## 2023-07-22 MED ORDER — CEFADROXIL 500 MG PO CAPS: 500.0000 mg | ORAL_CAPSULE | Freq: Two times a day (BID) | ORAL | 0 refills | Status: AC

## 2023-07-22 NOTE — Discharge Summary (Signed)
 Physician Discharge Summary   Patient: Desiree Hogan MRN: [id number] DOB: May 10, 1955  Admit date:     07/19/2023  Discharge date: 07/22/23  Discharge Physician: Eric Nunnery   PCP: Rosamond Leta NOVAK, MD   Recommendations at discharge:  Continue to closely follow patient's CBGs/A1c and start treatment with hypoglycemic regimen (patient declined initiations home to follow-up with PCP). - Check basic metabolic panel to follow ultralights and renal function Reassess blood pressure and adjust antihypertensive treatment as needed Repeat CBC to follow hemoglobin trend/stability. Continue assisting patient with weight loss management.  Discharge Diagnoses: Principal Problem:   AKI (acute kidney injury) (HCC) Active Problems:   Controlled type 2 diabetes mellitus without complication, without long-term current use of insulin  (HCC) Class I obesity  Brief hospital admission narrative:  Desiree Hogan is a 68 y.o. female with medical history significant of history of COPD, fibromyalgia, hypothyroidism, hypertension, hyperlipidemia, history of chron's disease, IBS and obesity; who presented to the hospital secondary to abdominal pain and feeling weak.  Patient expressed over the last week experiencing some intermittent abdominal cramps with associated diarrhea and inability to properly maintain adequate hydration/nutrition due to poor intestinal absorption.     Patient denies any melena, hematochezia, fever/chills, sick contacts, chest pain, shortness of breath, dysuria/hematuria, focal weaknesses or any other complaints.   Patient has continued to be compliant with all her meds prior to admission.  Workup in the ED demonstrating hypertension and acute kidney injury.  No elevation of WBCs appreciated and a CT abdomen with oral contrast demonstrating no diverticulitis or significant inflammatory changes to suggest acute Crohn's flare.   4 L of IV fluid resuscitation has been provided and patient blood  pressure remained low; peripheral Levophed  has been started and TRH contacted to place patient in the hospital for further evaluation and management.  Assessment and Plan: 1-acute kidney injury - Appears to be secondary to prerenal azotemia and dehydration - Continue to follow renal function trend/stability. - Maintain adequate hydration - Continue minimizing the use of nephrotoxic agents. -Safe to resume patient's ACE inhibitor at discharge.   2-hypovolemic shock - Appears to be associated with GI losses and poor oral intake - Initially requiring the use of levo peripherally - Currently off pressors - Continue fluid resuscitation - No nausea vomiting - Patient reports improvement in prior to admission diarrhea description. - Continue to follow vital sign - Continue holding antihypertensive agents.   3-hyponatremia/hypokalemia - In the setting of dehydration and GI losses - Continue maintaining adequate hydration and continue to replete electrolyte as needed. - Potassium within normal limits at discharge.   4-hyperglycemia without prior history of diabetes - A1c 7.2 - Discussed with patient regarding meeting criteria for type 2 diabetes - Patient declining the use of insulin  at the moment - Modified carbohydrate diet has been discussed with patient - Close outpatient follow-up to determine treatment with oral hypoglycemic management.   5-fibromyalgia - Continue treatment with Cymbalta , Lyrica  and as needed analgesics.   6-hyperlipidemia - Continue statin.   7-history of COPD - Very little wheezing appreciated on exam - Continue the use of Breo Ellipta  and as needed bronchodilator - No need for any steroids at the moment.   8-E. coli.  UTI - Continue to maintain adequate hydration - Patient discharged on cefadroxil  to complete antibiotic therapy.   9-history of Crohn's disease - Continue as needed pain total - No inflammatory changes appreciated on CT scan - CRP 25.6  and ESR 79 - Procalcitonin 17.5 -  Continue outpatient follow-up with GI service.   10-hypothyroidism -Continue Synthroid .   11-class I obesity -Body mass index is 34.48 kg/m.  -Low-calorie diet and portion control discussed with patient.  Consultants: GI service curbside. Procedures performed: See below for x-ray reports. Disposition: Home Diet recommendation: Heart healthy/modified carbohydrate diet.  DISCHARGE MEDICATION: Allergies as of 07/22/2023       Reactions   Bactrim [sulfamethoxazole-trimethoprim] Itching   And sores   Penicillins Anaphylaxis   Given keflex  on 01/17/2023 Immediate rash, facial/tongue/throat swelling, SOB or lightheadedness with hypotension Severe rash involving mucus membranes or skin necrosis   Stelara  [ustekinumab ] Nausea Only, Rash   Sweating, flushing, possible anaphylaxis   Tetracyclines & Related Swelling, Rash   Ciprofloxacin     Blisters and facial swelling        Medication List     STOP taking these medications    OVER THE COUNTER MEDICATION       TAKE these medications    albuterol  108 (90 Base) MCG/ACT inhaler Commonly known as: VENTOLIN  HFA as needed.   alendronate 70 MG tablet Commonly known as: FOSAMAX Take 70 mg by mouth every Friday. Take with a full glass of water  on an empty stomach.   Breo Ellipta  100-25 MCG/INH Aepb Generic drug: fluticasone  furoate-vilanterol Inhale 1 puff into the lungs daily.   CALCIUM  GLUCONATE PO Take 600 mg by mouth 2 (two) times daily.   cefadroxil  500 MG capsule Commonly known as: DURICEF Take 1 capsule (500 mg total) by mouth 2 (two) times daily for 5 days.   cetirizine 10 MG tablet Commonly known as: ZYRTEC Take 10 mg by mouth daily.   dicyclomine  10 MG capsule Commonly known as: BENTYL  TAKE 1 CAPSULE (10 MG TOTAL) BY MOUTH 3 (THREE) TIMES DAILY AS NEEDED FOR SPASMS.   DULoxetine  30 MG capsule Commonly known as: CYMBALTA  TAKE 1 CAPSULE BY MOUTH EVERY DAY   ENTYVIO   IV Inject into the vein. Every two months at [facility] Infusion clinic.   EPINEPHrine  0.3 mg/0.3 mL Soaj injection Commonly known as: EPI-PEN Inject 0.3 mg into the muscle as needed for anaphylaxis.   gemfibrozil  600 MG tablet Commonly known as: LOPID  Take 600 mg by mouth 2 (two) times daily.   levothyroxine  125 MCG tablet Commonly known as: SYNTHROID  Take 125 mcg by mouth daily.   lisinopril 2.5 MG tablet Commonly known as: ZESTRIL Take 2.5 mg by mouth daily.   OVER THE COUNTER MEDICATION [place] Multivitamin with Fe daily.   pregabalin  50 MG capsule Commonly known as: LYRICA  TAKE 1 CAPSULE BY MOUTH 2 TIMES DAILY.   rosuvastatin  10 MG tablet Commonly known as: CRESTOR  Take 10 mg by mouth daily.   tiZANidine  4 MG tablet Commonly known as: ZANAFLEX  TAKE 1 TABLET BY MOUTH AT BEDTIME AS NEEDED FOR MUSCLE SPASMS.   traMADol  50 MG tablet Commonly known as: ULTRAM  Take 1 tablet (50 mg total) by mouth 3 (three) times daily as needed.        Discharge Exam: Filed Weights   07/19/23 1450  Weight: 88.3 kg   General exam: Alert, awake, oriented x 3; in no major distress.  Reports no headaches no nausea vomiting.  Feeling ready to go home. Respiratory system: Good saturation on room air. Cardiovascular system:RRR. No rubs or gallops Gastrointestinal system: Abdomen is obese, nondistended, soft and nontender. No organomegaly or masses felt. Normal bowel sounds heard. Central nervous system: No focal neurological deficits. Extremities: No cyanosis or clubbing. Skin: No rashes, no petechiae. Psychiatry: Judgement  and insight appear normal. Mood & affect appropriate.    Condition at discharge: Stable and improved.  The results of significant diagnostics from this hospitalization (including imaging, microbiology, ancillary and laboratory) are listed below for reference.   Imaging Studies: CT ABDOMEN PELVIS WO CONTRAST Result Date: 07/19/2023 CLINICAL DATA:  Abdominal  pain and chills for 1 week, diarrhea, history of Crohn's disease EXAM: CT ABDOMEN AND PELVIS WITHOUT CONTRAST TECHNIQUE: Multidetector CT imaging of the abdomen and pelvis was performed following the standard protocol without IV contrast. Oral enteric contrast was administered. RADIATION DOSE REDUCTION: This exam was performed according to the departmental dose-optimization program which includes automated exposure control, adjustment of the mA and/or kV according to patient size and/or use of iterative reconstruction technique. COMPARISON:  12/20/2022 FINDINGS: Lower chest: No acute abnormality. Hepatobiliary: No solid liver abnormality is seen. Hepatomegaly, maximum coronal span 21.0 cm. Hepatic steatosis. No gallstones, gallbladder wall thickening, or biliary dilatation. Pancreas: Unremarkable. No pancreatic ductal dilatation or surrounding inflammatory changes. Spleen: Splenomegaly, maximum coronal span 14.7 cm. Adrenals/Urinary Tract: Adrenal glands are unremarkable. Kidneys are normal, without renal calculi, solid lesion, or hydronephrosis. Bladder is unremarkable. Stomach/Bowel: Stomach is within normal limits. Appendix appears normal. No evidence of bowel wall thickening, distention, or inflammatory changes. Descending and sigmoid diverticulosis, severe in the sigmoid. Vascular/Lymphatic: Aortic atherosclerosis. No enlarged abdominal or pelvic lymph nodes. Reproductive: No mass or other significant abnormality. Other: No abdominal wall hernia or abnormality. No ascites. Musculoskeletal: No acute or significant osseous findings. IMPRESSION: 1. No acute noncontrast CT findings of the abdomen or pelvis to explain abdominal pain. 2. Descending and sigmoid diverticulosis, severe in the sigmoid. No evidence of acute diverticulitis. 3. Hepatosplenomegaly and hepatic steatosis. Aortic Atherosclerosis (ICD10-I70.0). Electronically Signed   By: Marolyn JONETTA Jaksch M.D.   On: 07/19/2023 07:14   XR Pelvis 1-2 Views Result  Date: 07/06/2023 No SI joint narrowing or sclerosis was noted.  No acute pathology was noted.  Hardware was noted in the lumbar spine. Impression: Unremarkable x-rays of the SI joints.   Microbiology: Results for orders placed or performed during the hospital encounter of 07/19/23  Blood culture (routine x 2)     Status: None (Preliminary result)   Collection Time: 07/19/23  7:47 AM   Specimen: BLOOD  Result Value Ref Range Status   Specimen Description BLOOD BLOOD RIGHT HAND  Final   Special Requests   Final    BOTTLES DRAWN AEROBIC AND ANAEROBIC Blood Culture results may not be optimal due to an inadequate volume of blood received in culture bottles   Culture   Final    NO GROWTH 3 DAYS Performed at [facility], [address] [place], [place] [address]    Report Status PENDING  Incomplete  Blood culture (routine x 2)     Status: None (Preliminary result)   Collection Time: 07/19/23  7:51 AM   Specimen: BLOOD  Result Value Ref Range Status   Specimen Description BLOOD BLOOD RIGHT HAND  Final   Special Requests   Final    BOTTLES DRAWN AEROBIC AND ANAEROBIC Blood Culture results may not be optimal due to an inadequate volume of blood received in culture bottles   Culture   Final    NO GROWTH 3 DAYS Performed at [facility], [address] [place], [place] [address]    Report Status PENDING  Incomplete  Urine Culture (for pregnant, neutropenic or urologic patients or patients with an indwelling urinary catheter)     Status: Abnormal  Collection Time: 07/19/23 12:19 PM   Specimen: Urine, Clean Catch  Result Value Ref Range Status   Specimen Description   Final    URINE, CLEAN CATCH Performed at [facility], [address], [place], [place] [address]    Special Requests   Final    NONE Performed at [facility], [address], [place], [place] [address]    Culture >=100,000 COLONIES/mL ESCHERICHIA COLI (A)  Final   Report Status 07/22/2023 FINAL  Final   Organism ID,  Bacteria ESCHERICHIA COLI (A)  Final      Susceptibility   Escherichia coli - MIC*    AMPICILLIN >=32 RESISTANT Resistant     CEFAZOLIN <=4 SENSITIVE Sensitive     CEFEPIME <=0.12 SENSITIVE Sensitive     CEFTRIAXONE  <=0.25 SENSITIVE Sensitive     CIPROFLOXACIN  <=0.25 SENSITIVE Sensitive     GENTAMICIN <=1 SENSITIVE Sensitive     IMIPENEM <=0.25 SENSITIVE Sensitive     NITROFURANTOIN <=16 SENSITIVE Sensitive     TRIMETH/SULFA <=20 SENSITIVE Sensitive     AMPICILLIN/SULBACTAM 4 SENSITIVE Sensitive     PIP/TAZO <=4 SENSITIVE Sensitive ug/mL    * >=100,000 COLONIES/mL ESCHERICHIA COLI  MRSA Next Gen by PCR, Nasal     Status: None   Collection Time: 07/19/23  2:45 PM   Specimen: Nasal Mucosa; Nasal Swab  Result Value Ref Range Status   MRSA by PCR Next Gen NOT DETECTED NOT DETECTED Final    Comment: (NOTE) The GeneXpert MRSA Assay (FDA approved for NASAL specimens only), is one component of a comprehensive MRSA colonization surveillance program. It is not intended to diagnose MRSA infection nor to guide or monitor treatment for MRSA infections. Test performance is not FDA approved in patients less than 48 years old. Performed at [facility], [address], [place], [place] [address]     Labs: CBC: Recent Labs  Lab 07/19/23 0406  WBC 10.3  NEUTROABS 8.6*  HGB 10.9*  HCT 33.2*  MCV 85.6  PLT 166   Basic Metabolic Panel: Recent Labs  Lab 07/19/23 0406 07/19/23 0939 07/20/23 0421 07/21/23 0421 07/22/23 0242  NA 129*  --  133* 136 137  K 3.4*  --  3.4* 4.2 3.8  CL 96*  --  108 107 106  CO2 19*  --  16* 20* 22  GLUCOSE 284*  --  128* 132* 123*  BUN 46*  --  30* 19 17  CREATININE 2.94*  --  1.68* 1.10* 0.99  CALCIUM  7.5*  --  7.0* 8.0* 8.4*  MG  --   --  1.9  --   --   PHOS  --  3.1  --   --   --    Liver Function Tests: Recent Labs  Lab 07/19/23 0406  AST 32  ALT 21  ALKPHOS 94  BILITOT 0.9  PROT 7.1  ALBUMIN 2.8*   CBG: Recent Labs  Lab 07/21/23 1103  07/21/23 1610 07/21/23 2023 07/22/23 0730 07/22/23 1133  GLUCAP 124* 144* 148* 135* 158*    Discharge time spent:  35 minutes.  Signed: Eric Nunnery, MD [facility] 07/22/2023

## 2023-07-22 NOTE — Progress Notes (Signed)
 SATURATION QUALIFICATIONS: (This note is used to comply with regulatory documentation for home oxygen)  Patient Saturations on Room Air at Rest = 93%  Patient Saturations on Room Air while Ambulating = 91%  Patient verbalized no complaints of shortness of breath. Patient had steady gait. MD Ricky made aware.

## 2023-07-22 NOTE — Progress Notes (Signed)
 Patient discharged home today, transported home by husband. Discharge paperwork went over with patient and spouse, both verbalized understanding. Belongings sent home with patient.

## 2023-07-22 NOTE — Plan of Care (Signed)

## 2023-07-24 LAB — CULTURE, BLOOD (ROUTINE X 2)
Culture: NO GROWTH
Culture: NO GROWTH

## 2023-08-21 ENCOUNTER — Other Ambulatory Visit: Payer: Self-pay | Admitting: Physician Assistant

## 2023-08-21 NOTE — Telephone Encounter (Signed)
 Last Fill: 05/26/2023  Next Visit: 01/12/2024  Last Visit: 07/06/2023  Dx: Fibromyalgia   Current Dose per office note on 07/06/2023: tizanidine  4 mg at bedtime as needed for muscle spasms   Okay to refill Tizanidine ?

## 2023-08-21 NOTE — Telephone Encounter (Signed)
 Last Fill: 05/26/2023  Next Visit: 01/12/2024  Last Visit: 07/06/2023  Dx:  Fibromyalgia   Current Dose per office note on 07/06/2023: tizanidine  4 mg at bedtime as needed for muscle spasms   Okay to refill Tizanidine ?

## 2023-08-30 ENCOUNTER — Encounter: Attending: Gastroenterology | Admitting: Emergency Medicine

## 2023-08-30 VITALS — BP 153/76 | HR 81 | Temp 97.6°F | Resp 16

## 2023-08-30 DIAGNOSIS — K5080 Crohn's disease of both small and large intestine without complications: Secondary | ICD-10-CM | POA: Diagnosis not present

## 2023-08-30 MED ORDER — ACETAMINOPHEN 325 MG PO TABS
650.0000 mg | ORAL_TABLET | Freq: Once | ORAL | Status: AC
  Administered 2023-08-30: 650 mg via ORAL

## 2023-08-30 MED ORDER — METHYLPREDNISOLONE SODIUM SUCC 125 MG IJ SOLR
125.0000 mg | Freq: Once | INTRAMUSCULAR | Status: AC
  Administered 2023-08-30: 125 mg via INTRAVENOUS

## 2023-08-30 MED ORDER — VEDOLIZUMAB 300 MG IV SOLR
300.0000 mg | Freq: Once | INTRAVENOUS | Status: AC
  Administered 2023-08-30: 300 mg via INTRAVENOUS
  Filled 2023-08-30: qty 5

## 2023-08-30 MED ORDER — DIPHENHYDRAMINE HCL 50 MG/ML IJ SOLN
50.0000 mg | Freq: Once | INTRAMUSCULAR | Status: AC
  Administered 2023-08-30: 50 mg via INTRAVENOUS

## 2023-08-30 NOTE — Progress Notes (Signed)
 Diagnosis: Crohn's Disease  Provider:  Eartha Sieving MD  Procedure: IV Infusion  IV Type: Peripheral, IV Location: R Antecubital  Entyvio  (Vedolizumab ), Dose: 300 mg  Infusion Start Time: 1322  Infusion Stop Time: 1355  Post Infusion IV Care: Peripheral IV discontinued  Discharge: Condition: Good, Destination: Home . AVS Declined  Performed by:  Delon ONEIDA Officer, RN

## 2023-09-27 ENCOUNTER — Other Ambulatory Visit: Payer: Self-pay | Admitting: Physician Assistant

## 2023-09-27 NOTE — Telephone Encounter (Signed)
 Last Fill: 07/02/2023  Next Visit: 01/12/2024  Last Visit: 07/06/2023  Dx: Fibromyalgia   Current Dose per office note on 07/06/2023: dose not discussed.  Okay to refill Cymbalta ?

## 2023-10-18 ENCOUNTER — Encounter (INDEPENDENT_AMBULATORY_CARE_PROVIDER_SITE_OTHER): Payer: Self-pay | Admitting: Gastroenterology

## 2023-10-25 ENCOUNTER — Encounter: Attending: Gastroenterology | Admitting: Emergency Medicine

## 2023-10-25 VITALS — BP 156/83 | HR 85 | Temp 97.8°F | Resp 17

## 2023-10-25 DIAGNOSIS — K5080 Crohn's disease of both small and large intestine without complications: Secondary | ICD-10-CM | POA: Diagnosis not present

## 2023-10-25 MED ORDER — METHYLPREDNISOLONE SODIUM SUCC 125 MG IJ SOLR
125.0000 mg | Freq: Once | INTRAMUSCULAR | Status: AC
  Administered 2023-10-25: 125 mg via INTRAVENOUS

## 2023-10-25 MED ORDER — ACETAMINOPHEN 325 MG PO TABS
650.0000 mg | ORAL_TABLET | Freq: Once | ORAL | Status: AC
  Administered 2023-10-25: 650 mg via ORAL

## 2023-10-25 MED ORDER — VEDOLIZUMAB 300 MG IV SOLR
300.0000 mg | Freq: Once | INTRAVENOUS | Status: AC
  Administered 2023-10-25: 300 mg via INTRAVENOUS
  Filled 2023-10-25: qty 5

## 2023-10-25 MED ORDER — DIPHENHYDRAMINE HCL 50 MG/ML IJ SOLN
50.0000 mg | Freq: Once | INTRAMUSCULAR | Status: AC
  Administered 2023-10-25: 50 mg via INTRAVENOUS

## 2023-10-25 NOTE — Progress Notes (Signed)
 Diagnosis: Crohn's Disease  Provider:  Eartha Sieving MD  Procedure: IV Infusion  IV Type: Peripheral, IV Location: R Antecubital  Entyvio  (Vedolizumab ), Dose: 300 mg  Infusion Start Time: 1318  Infusion Stop Time: 1352  Post Infusion IV Care: Peripheral IV Discontinued  Discharge: Condition: Good, Destination: Home . AVS Provided  Performed by:  Delon ONEIDA Officer, RN

## 2023-11-09 ENCOUNTER — Encounter (INDEPENDENT_AMBULATORY_CARE_PROVIDER_SITE_OTHER): Payer: Self-pay | Admitting: Gastroenterology

## 2023-11-18 ENCOUNTER — Other Ambulatory Visit: Payer: Self-pay | Admitting: Physician Assistant

## 2023-11-20 NOTE — Telephone Encounter (Signed)
 Last Fill: 08/21/2023  Next Visit: 01/12/2024  Last Visit: 07/06/2023  Dx: Fibromyalgia   Current Dose per office note on 07/06/2023: tizanidine  4 mg at bedtime as needed for muscle spasms   Okay to refill Tizanidine ?

## 2023-12-02 ENCOUNTER — Other Ambulatory Visit: Payer: Self-pay | Admitting: Physical Medicine & Rehabilitation

## 2023-12-03 ENCOUNTER — Other Ambulatory Visit: Payer: Self-pay | Admitting: Registered Nurse

## 2023-12-04 ENCOUNTER — Telehealth: Payer: Self-pay | Admitting: Registered Nurse

## 2023-12-04 MED ORDER — PREGABALIN 50 MG PO CAPS: 50.0000 mg | ORAL_CAPSULE | Freq: Two times a day (BID) | ORAL | 4 refills | Status: AC

## 2023-12-04 NOTE — Telephone Encounter (Signed)
 PDMP was Reviewed.  Pregabalin  e-scribed to pharmacy.

## 2023-12-14 DIAGNOSIS — Z79899 Other long term (current) drug therapy: Secondary | ICD-10-CM | POA: Diagnosis not present

## 2023-12-14 DIAGNOSIS — E7800 Pure hypercholesterolemia, unspecified: Secondary | ICD-10-CM | POA: Diagnosis not present

## 2023-12-14 DIAGNOSIS — E1165 Type 2 diabetes mellitus with hyperglycemia: Secondary | ICD-10-CM | POA: Diagnosis not present

## 2023-12-14 DIAGNOSIS — R5383 Other fatigue: Secondary | ICD-10-CM | POA: Diagnosis not present

## 2023-12-14 DIAGNOSIS — E039 Hypothyroidism, unspecified: Secondary | ICD-10-CM | POA: Diagnosis not present

## 2023-12-15 ENCOUNTER — Other Ambulatory Visit: Payer: Self-pay | Admitting: Registered Nurse

## 2023-12-15 NOTE — Telephone Encounter (Signed)
 PDMP was Reviewed.  Landi has a scheduled appointment next week, Tramadol  e-scribed with no refills.  Refills will be addressed at next office visit

## 2023-12-20 ENCOUNTER — Ambulatory Visit: Attending: Gastroenterology

## 2023-12-20 VITALS — BP 118/48 | HR 92 | Temp 98.4°F | Resp 18

## 2023-12-20 DIAGNOSIS — K5080 Crohn's disease of both small and large intestine without complications: Secondary | ICD-10-CM | POA: Insufficient documentation

## 2023-12-20 MED ORDER — METHYLPREDNISOLONE SODIUM SUCC 125 MG IJ SOLR
125.0000 mg | Freq: Once | INTRAMUSCULAR | Status: AC
  Administered 2023-12-20: 13:00:00 125 mg via INTRAVENOUS

## 2023-12-20 MED ORDER — DIPHENHYDRAMINE HCL 50 MG/ML IJ SOLN
50.0000 mg | Freq: Once | INTRAMUSCULAR | Status: AC
  Administered 2023-12-20: 13:00:00 50 mg via INTRAVENOUS

## 2023-12-20 MED ORDER — ACETAMINOPHEN 325 MG PO TABS
650.0000 mg | ORAL_TABLET | Freq: Once | ORAL | Status: AC
  Administered 2023-12-20: 13:00:00 650 mg via ORAL

## 2023-12-20 MED ORDER — VEDOLIZUMAB 300 MG IV SOLR
300.0000 mg | Freq: Once | INTRAVENOUS | Status: AC
  Administered 2023-12-20: 13:00:00 300 mg via INTRAVENOUS
  Filled 2023-12-20: qty 5

## 2023-12-20 NOTE — Progress Notes (Signed)
 Diagnosis: Crohn's Disease  Provider:  Eartha Sieving MD  Procedure: IV Infusion  IV Type: Peripheral, IV Location: L Antecubital  Entyvio  (Vedolizumab ), Dose: 300 mg  Infusion Start Time: 1329  Infusion Stop Time: 1404  Post Infusion IV Care: Peripheral IV Discontinued  Discharge: Condition: Good, Destination: Home . AVS Declined  Performed by:  Delon ONEIDA Officer, RN

## 2023-12-21 ENCOUNTER — Ambulatory Visit (INDEPENDENT_AMBULATORY_CARE_PROVIDER_SITE_OTHER): Admitting: Gastroenterology

## 2023-12-21 ENCOUNTER — Encounter (INDEPENDENT_AMBULATORY_CARE_PROVIDER_SITE_OTHER): Payer: Self-pay | Admitting: Gastroenterology

## 2023-12-21 ENCOUNTER — Telehealth (INDEPENDENT_AMBULATORY_CARE_PROVIDER_SITE_OTHER): Payer: Self-pay

## 2023-12-21 VITALS — BP 129/78 | HR 94 | Temp 97.1°F | Ht 63.0 in | Wt 191.0 lb

## 2023-12-21 DIAGNOSIS — K5080 Crohn's disease of both small and large intestine without complications: Secondary | ICD-10-CM

## 2023-12-21 DIAGNOSIS — Z1321 Encounter for screening for nutritional disorder: Secondary | ICD-10-CM | POA: Diagnosis not present

## 2023-12-21 DIAGNOSIS — Z7960 Long term (current) use of unspecified immunomodulators and immunosuppressants: Secondary | ICD-10-CM

## 2023-12-21 DIAGNOSIS — Z5181 Encounter for therapeutic drug level monitoring: Secondary | ICD-10-CM

## 2023-12-21 MED ORDER — PEG 3350-KCL-NA BICARB-NACL 420 G PO SOLR: 4000.0000 mL | Freq: Once | ORAL | 0 refills | Status: AC

## 2023-12-21 NOTE — Telephone Encounter (Signed)
 No PA required for [employer] on [employer]

## 2023-12-21 NOTE — Patient Instructions (Signed)
 Schedule colonoscopy Perform blood workup Continue Entyvio  every 8 weeks Please obtain flu, pneumonia and shingles vaccination Dermatology referral

## 2023-12-21 NOTE — H&P (View-Only) (Signed)
 Toribio Fortune, M.D. Gastroenterology & Hepatology [facility] Gastroenterology [address] [place], [place] [address]  Primary Care Physician: Rosamond Leta NOVAK, MD [address] [place] [address]  I will communicate my assessment and recommendations to the referring MD via EMR.  Problems: Crohn's ileocolitis Possible anaphylactic reaction to Stelara    History of Present Illness: Desiree Hogan is a 68 y.o. female with pmh of Crohn's disease, anemia, COPD, DM, fibromyalgia, HLD, Hypothyroidism, IBS, PVD,  who presents for follow up of Crohn's disease.  The patient was last seen on 06/05/2023. At that time, the patient was continued on Entyvio  every 8 weeks.  Patient was counseled about pneumonia and shingles vaccination.  Denies any complaints. States she is having a BM every day without diarrhea. The patient denies having any nausea, vomiting, fever, chills, hematochezia, melena, hematemesis, abdominal distention, abdominal pain, diarrhea, jaundice, pruritus or weight loss.  Last fecal calprotectin on 06/07/2023 was normal at 80.  Previous medications: Mesalamine , Stelara  (anaphylaxis), Entyvio  (started in February 2025)   Last flu shot:2024 Last pneumonia shot:never Last Pap smear: advised to stop by PCP Last evaluation by dermatology: never Last zoster vaccine: never Last DEXA scan: had osteopenia - following with PCP COVID-19 shot: none [facility] TB/hep B testing:01/05/23   Last Colonoscopy: 11/11/2022 Terminal ileum with few erosions, few erosions in the ascending colon, 4 mm polyp in the ascending colon, 1 mm polyp in the ascending colon, congested mucosa in the proximal transverse colon, presence of a 10 to 15 mm scar in the proximal transverse colon, diverticulosis.   A. COLON, PROXIMAL TRANSVERSE SCAR, BIOPSY:      Colonic mucosa with focal mild crypt architectural disarray.      Negative for activity, granuloma, dysplasia or malignancy.  B. SMALL  BOWEL, BIOPSY:      Active ileitis with crypt architectural disarray.      Negative for granuloma, dysplasia or malignancy.  C. COLON, ASCENDING, POLYPECTOMY:      Polypoid low-grade dysplasia.      Background colonic mucosa with active colitis and focal mild crypt architectural disarray.      Negative for granuloma, dysplasia or malignancy.  D. COLON, CECUM, ASCENDING, BIOPSY:      Colonic mucosa with active colitis and focal mild crypt architectural disarray.      Negative for granuloma, dysplasia or malignancy.  E. COLON, TRANSVERSE, BIOPSY:      Colonic mucosa with focal mild crypt architectural disarray.      Negative for activity, granuloma, dysplasia or malignancy   Past Medical History: Past Medical History:  Diagnosis Date   Abdominal pain    Anemia    Back pain    Colitis    COPD (chronic obstructive pulmonary disease) (HCC)    not on home o2   Crohn's disease (HCC) 03/29/12   Diabetes mellitus without complication (HCC)    Fibromyalgia    H/O breast biopsy    Hip pain    Hyperlipidemia    Hypothyroidism    Irritable bowel syndrome    Joint pain    Nonspecific abnormal finding in stool contents    Peripheral vascular disease    Ulcer    Mouth    Past Surgical History: Past Surgical History:  Procedure Laterality Date   BIOPSY  12/20/2017   Procedure: BIOPSY;  Surgeon: Golda Claudis PENNER, MD;  [facility];  Service: Endoscopy;;  cecal erosions   BIOPSY  11/11/2022   Procedure: BIOPSY;  Surgeon: Fortune  Angelia Sieving, MD;  [facility];  Service: Gastroenterology;;   CESAREAN SECTION  06/24/82   CHOLECYSTECTOMY  1993   Gall Bladder   COLONOSCOPY  Jan. 31, 2014   COLONOSCOPY N/A 12/20/2017   Procedure: COLONOSCOPY;  Surgeon: Golda Claudis PENNER, MD;  [facility];  Service: Endoscopy;  Laterality: N/A;  2:25   COLONOSCOPY WITH PROPOFOL  N/A 11/11/2022   Procedure: COLONOSCOPY WITH PROPOFOL ;  Surgeon: Eartha Angelia Sieving, MD;  [facility];  Service: Gastroenterology;  Laterality: N/A;  9:30AM;ASA 3   POLYPECTOMY  11/11/2022   Procedure: POLYPECTOMY;  Surgeon: Eartha Angelia Sieving, MD;  [facility];  Service: Gastroenterology;;   SPINE SURGERY  2010 and 2011   TONSILLECTOMY      Family History: Family History  Problem Relation Age of Onset   Cancer Mother        Colon or vaginal ?   Deep vein thrombosis Mother    Hypertension Father    Diabetes Father    Heart disease Father        Heart Disease before age 45   Diabetes Sister    Diabetes Brother    Hypertension Brother    Healthy Daughter    Healthy Daughter    Healthy Daughter    Healthy Son    Breast cancer Neg Hx     Social History:Tobacco Use History[1] Social History   Substance and Sexual Activity  Alcohol  Use No   Social History   Substance and Sexual Activity  Drug Use No    Allergies: Allergies[2]  Medications: Current Outpatient Medications  Medication Sig Dispense Refill   albuterol  (VENTOLIN  HFA) 108 (90 Base) MCG/ACT inhaler as needed.     alendronate (FOSAMAX) 70 MG tablet Take 70 mg by mouth every Friday. Take with a full glass of water  on an empty stomach.     BREO ELLIPTA  100-25 MCG/INH AEPB Inhale 1 puff into the lungs daily.      CALCIUM  GLUCONATE PO Take 600 mg by mouth 2 (two) times daily.      cetirizine (ZYRTEC) 10 MG tablet Take 10 mg by mouth daily.     dicyclomine  (BENTYL ) 10 MG capsule TAKE 1 CAPSULE (10 MG TOTAL) BY MOUTH 3 (THREE) TIMES DAILY AS NEEDED FOR SPASMS. 270 capsule 1   DULoxetine  (CYMBALTA ) 30 MG capsule TAKE 1 CAPSULE BY MOUTH EVERY DAY 90 capsule 0   EPINEPHrine  0.3 mg/0.3 mL IJ SOAJ injection Inject 0.3 mg into the muscle as needed for anaphylaxis. 1 each 0   gemfibrozil  (LOPID ) 600 MG tablet Take 600 mg by mouth 2 (two) times daily.     levothyroxine  (SYNTHROID , LEVOTHROID) 125 MCG tablet Take 125 mcg by mouth daily.  3   lisinopril (ZESTRIL) 2.5 MG  tablet Take 2.5 mg by mouth daily.     OVER THE COUNTER MEDICATION [employer]stone Multivitamin with Fe daily.     pregabalin  (LYRICA ) 50 MG capsule Take 1 capsule (50 mg total) by mouth 2 (two) times daily. 60 capsule 4   rosuvastatin  (CRESTOR ) 10 MG tablet Take 10 mg by mouth daily.  3   tiZANidine  (ZANAFLEX ) 4 MG tablet TAKE 1 TABLET BY MOUTH AT BEDTIME AS NEEDED FOR MUSCLE SPASMS. 90 tablet 0   traMADol  (ULTRAM ) 50 MG tablet TAKE 1 TABLET BY MOUTH 3 TIMES DAILY AS NEEDED. 90 tablet 0   Vedolizumab  (ENTYVIO  IV) Inject into the vein. Every two months at [facility].  No current facility-administered medications for this visit.    Review of Systems: GENERAL: negative for malaise, night sweats HEENT: No changes in hearing or vision, no nose bleeds or other nasal problems. NECK: Negative for lumps, goiter, pain and significant neck swelling RESPIRATORY: Negative for cough, wheezing CARDIOVASCULAR: Negative for chest pain, leg swelling, palpitations, orthopnea GI: SEE HPI MUSCULOSKELETAL: Negative for joint pain or swelling, back pain, and muscle pain. SKIN: Negative for lesions, rash PSYCH: Negative for sleep disturbance, mood disorder and recent psychosocial stressors. HEMATOLOGY Negative for prolonged bleeding, bruising easily, and swollen nodes. ENDOCRINE: Negative for cold or heat intolerance, polyuria, polydipsia and goiter. NEURO: negative for tremor, gait imbalance, syncope and seizures. The remainder of the review of systems is noncontributory.   Physical Exam: BP (!) 148/69 (BP Location: Left Arm, Patient Position: Sitting, Cuff Size: Large)   Pulse (!) 105   Temp (!) 97.1 F (36.2 C) (Temporal)   Ht 5' 3 (1.6 m)   Wt 191 lb (86.6 kg)   BMI 33.83 kg/m  GENERAL: The patient is AO x3, in no acute distress. HEENT: Head is normocephalic and atraumatic. EOMI are intact. Mouth is well hydrated and without lesions. NECK: Supple. No masses LUNGS: Clear to  auscultation. No presence of rhonchi/wheezing/rales. Adequate chest expansion HEART: RRR, normal s1 and s2. ABDOMEN: Soft, nontender, no guarding, no peritoneal signs, and nondistended. BS +. No masses. EXTREMITIES: Without any cyanosis, clubbing, rash, lesions or edema. NEUROLOGIC: AOx3, no focal motor deficit. SKIN: no jaundice, no rashes  Imaging/Labs: as above  I personally reviewed and interpreted the available labs, imaging and endoscopic files.  Impression and Plan: Desiree Hogan is a 68 y.o. female with pmh of Crohn's disease, anemia, COPD, DM, fibromyalgia, HLD, Hypothyroidism, IBS, PVD,  who presents for follow up of Crohn's disease.  The patient had presented issues with  uncontrolled Crohn's disease for multiple years.  Most recently she has been on Entyvio  and has presented a dramatic clinical response as she is completely asymptomatic.  She had a calprotectin checked this year that showed normalization of the levels of this test.  We discussed that this is reassuring and it is likely she is a responder to Entyvio  for her disease.  Will need to assess this endoscopically with a repeat colonoscopy which will be scheduled for next week.  Will obtain surveillance labs today.  In terms of her preventative measures, I encouraged the patient to obtain flu, pneumonia and shingles vaccination.  Will refer her to dermatology for annual cancer screening.  -Schedule colonoscopy - Check CBC, CMP, CRP and vitamin D  levels -Continue Entyvio  every 8 weeks -Please obtain flu, pneumonia and shingles vaccination -Dermatology referral   All questions were answered.      Toribio Fortune, MD Gastroenterology and Hepatology [facility] Gastroenterology     [1]  Social History Tobacco Use  Smoking Status Former   Current packs/day: 0.00   Average packs/day: 0.8 packs/day for 20.0 years (15.0 ttl pk-yrs)   Types: Cigarettes   Start date: 01/03/1989   Quit date: 01/03/2009    Years since quitting: 14.9   Passive exposure: Never  Smokeless Tobacco Never  [2]  Allergies Allergen Reactions   Bactrim [Sulfamethoxazole-Trimethoprim] Itching    And sores   Penicillins Anaphylaxis    Given keflex  on 01/17/2023 Immediate rash, facial/tongue/throat swelling, SOB or lightheadedness with hypotension Severe rash involving mucus membranes or skin necrosis    Stelara  [Ustekinumab ] Nausea Only and Rash    Sweating, flushing,  possible anaphylaxis   Tetracyclines & Related Swelling and Rash   Ciprofloxacin      Blisters and facial swelling

## 2023-12-21 NOTE — Telephone Encounter (Signed)
 Spoke with patient in the office, scheduled TCS for 12/26/2023 at 10:15am. Rx sent to pharmacy. Instructions given to patient on paper and sent on mychart.

## 2023-12-21 NOTE — Progress Notes (Signed)
 Toribio Fortune, M.D. Gastroenterology & Hepatology [facility] Gastroenterology [address] [place], [place] [address]  Primary Care Physician: Rosamond Leta NOVAK, MD [address] [place] [address]  I will communicate my assessment and recommendations to the referring MD via EMR.  Problems: Crohn's ileocolitis Possible anaphylactic reaction to Stelara    History of Present Illness: Desiree Hogan is a 68 y.o. female with pmh of Crohn's disease, anemia, COPD, DM, fibromyalgia, HLD, Hypothyroidism, IBS, PVD,  who presents for follow up of Crohn's disease.  The patient was last seen on 06/05/2023. At that time, the patient was continued on Entyvio  every 8 weeks.  Patient was counseled about pneumonia and shingles vaccination.  Denies any complaints. States she is having a BM every day without diarrhea. The patient denies having any nausea, vomiting, fever, chills, hematochezia, melena, hematemesis, abdominal distention, abdominal pain, diarrhea, jaundice, pruritus or weight loss.  Last fecal calprotectin on 06/07/2023 was normal at 80.  Previous medications: Mesalamine , Stelara  (anaphylaxis), Entyvio  (started in February 2025)   Last flu shot:2024 Last pneumonia shot:never Last Pap smear: advised to stop by PCP Last evaluation by dermatology: never Last zoster vaccine: never Last DEXA scan: had osteopenia - following with PCP COVID-19 shot: none [facility] TB/hep B testing:01/05/23   Last Colonoscopy: 11/11/2022 Terminal ileum with few erosions, few erosions in the ascending colon, 4 mm polyp in the ascending colon, 1 mm polyp in the ascending colon, congested mucosa in the proximal transverse colon, presence of a 10 to 15 mm scar in the proximal transverse colon, diverticulosis.   A. COLON, PROXIMAL TRANSVERSE SCAR, BIOPSY:      Colonic mucosa with focal mild crypt architectural disarray.      Negative for activity, granuloma, dysplasia or malignancy.  B. SMALL  BOWEL, BIOPSY:      Active ileitis with crypt architectural disarray.      Negative for granuloma, dysplasia or malignancy.  C. COLON, ASCENDING, POLYPECTOMY:      Polypoid low-grade dysplasia.      Background colonic mucosa with active colitis and focal mild crypt architectural disarray.      Negative for granuloma, dysplasia or malignancy.  D. COLON, CECUM, ASCENDING, BIOPSY:      Colonic mucosa with active colitis and focal mild crypt architectural disarray.      Negative for granuloma, dysplasia or malignancy.  E. COLON, TRANSVERSE, BIOPSY:      Colonic mucosa with focal mild crypt architectural disarray.      Negative for activity, granuloma, dysplasia or malignancy   Past Medical History: Past Medical History:  Diagnosis Date   Abdominal pain    Anemia    Back pain    Colitis    COPD (chronic obstructive pulmonary disease) (HCC)    not on home o2   Crohn's disease (HCC) 03/29/12   Diabetes mellitus without complication (HCC)    Fibromyalgia    H/O breast biopsy    Hip pain    Hyperlipidemia    Hypothyroidism    Irritable bowel syndrome    Joint pain    Nonspecific abnormal finding in stool contents    Peripheral vascular disease    Ulcer    Mouth    Past Surgical History: Past Surgical History:  Procedure Laterality Date   BIOPSY  12/20/2017   Procedure: BIOPSY;  Surgeon: Golda Claudis PENNER, MD;  [facility];  Service: Endoscopy;;  cecal erosions   BIOPSY  11/11/2022   Procedure: BIOPSY;  Surgeon: Fortune  Angelia Sieving, MD;  [facility];  Service: Gastroenterology;;   CESAREAN SECTION  06/24/82   CHOLECYSTECTOMY  1993   Gall Bladder   COLONOSCOPY  Jan. 31, 2014   COLONOSCOPY N/A 12/20/2017   Procedure: COLONOSCOPY;  Surgeon: Golda Claudis PENNER, MD;  [facility];  Service: Endoscopy;  Laterality: N/A;  2:25   COLONOSCOPY WITH PROPOFOL  N/A 11/11/2022   Procedure: COLONOSCOPY WITH PROPOFOL ;  Surgeon: Eartha Angelia Sieving, MD;  [facility];  Service: Gastroenterology;  Laterality: N/A;  9:30AM;ASA 3   POLYPECTOMY  11/11/2022   Procedure: POLYPECTOMY;  Surgeon: Eartha Angelia Sieving, MD;  [facility];  Service: Gastroenterology;;   SPINE SURGERY  2010 and 2011   TONSILLECTOMY      Family History: Family History  Problem Relation Age of Onset   Cancer Mother        Colon or vaginal ?   Deep vein thrombosis Mother    Hypertension Father    Diabetes Father    Heart disease Father        Heart Disease before age 45   Diabetes Sister    Diabetes Brother    Hypertension Brother    Healthy Daughter    Healthy Daughter    Healthy Daughter    Healthy Son    Breast cancer Neg Hx     Social History:Tobacco Use History[1] Social History   Substance and Sexual Activity  Alcohol  Use No   Social History   Substance and Sexual Activity  Drug Use No    Allergies: Allergies[2]  Medications: Current Outpatient Medications  Medication Sig Dispense Refill   albuterol  (VENTOLIN  HFA) 108 (90 Base) MCG/ACT inhaler as needed.     alendronate (FOSAMAX) 70 MG tablet Take 70 mg by mouth every Friday. Take with a full glass of water  on an empty stomach.     BREO ELLIPTA  100-25 MCG/INH AEPB Inhale 1 puff into the lungs daily.      CALCIUM  GLUCONATE PO Take 600 mg by mouth 2 (two) times daily.      cetirizine (ZYRTEC) 10 MG tablet Take 10 mg by mouth daily.     dicyclomine  (BENTYL ) 10 MG capsule TAKE 1 CAPSULE (10 MG TOTAL) BY MOUTH 3 (THREE) TIMES DAILY AS NEEDED FOR SPASMS. 270 capsule 1   DULoxetine  (CYMBALTA ) 30 MG capsule TAKE 1 CAPSULE BY MOUTH EVERY DAY 90 capsule 0   EPINEPHrine  0.3 mg/0.3 mL IJ SOAJ injection Inject 0.3 mg into the muscle as needed for anaphylaxis. 1 each 0   gemfibrozil  (LOPID ) 600 MG tablet Take 600 mg by mouth 2 (two) times daily.     levothyroxine  (SYNTHROID , LEVOTHROID) 125 MCG tablet Take 125 mcg by mouth daily.  3   lisinopril (ZESTRIL) 2.5 MG  tablet Take 2.5 mg by mouth daily.     OVER THE COUNTER MEDICATION [employer]stone Multivitamin with Fe daily.     pregabalin  (LYRICA ) 50 MG capsule Take 1 capsule (50 mg total) by mouth 2 (two) times daily. 60 capsule 4   rosuvastatin  (CRESTOR ) 10 MG tablet Take 10 mg by mouth daily.  3   tiZANidine  (ZANAFLEX ) 4 MG tablet TAKE 1 TABLET BY MOUTH AT BEDTIME AS NEEDED FOR MUSCLE SPASMS. 90 tablet 0   traMADol  (ULTRAM ) 50 MG tablet TAKE 1 TABLET BY MOUTH 3 TIMES DAILY AS NEEDED. 90 tablet 0   Vedolizumab  (ENTYVIO  IV) Inject into the vein. Every two months at [facility].  No current facility-administered medications for this visit.    Review of Systems: GENERAL: negative for malaise, night sweats HEENT: No changes in hearing or vision, no nose bleeds or other nasal problems. NECK: Negative for lumps, goiter, pain and significant neck swelling RESPIRATORY: Negative for cough, wheezing CARDIOVASCULAR: Negative for chest pain, leg swelling, palpitations, orthopnea GI: SEE HPI MUSCULOSKELETAL: Negative for joint pain or swelling, back pain, and muscle pain. SKIN: Negative for lesions, rash PSYCH: Negative for sleep disturbance, mood disorder and recent psychosocial stressors. HEMATOLOGY Negative for prolonged bleeding, bruising easily, and swollen nodes. ENDOCRINE: Negative for cold or heat intolerance, polyuria, polydipsia and goiter. NEURO: negative for tremor, gait imbalance, syncope and seizures. The remainder of the review of systems is noncontributory.   Physical Exam: BP (!) 148/69 (BP Location: Left Arm, Patient Position: Sitting, Cuff Size: Large)   Pulse (!) 105   Temp (!) 97.1 F (36.2 C) (Temporal)   Ht 5' 3 (1.6 m)   Wt 191 lb (86.6 kg)   BMI 33.83 kg/m  GENERAL: The patient is AO x3, in no acute distress. HEENT: Head is normocephalic and atraumatic. EOMI are intact. Mouth is well hydrated and without lesions. NECK: Supple. No masses LUNGS: Clear to  auscultation. No presence of rhonchi/wheezing/rales. Adequate chest expansion HEART: RRR, normal s1 and s2. ABDOMEN: Soft, nontender, no guarding, no peritoneal signs, and nondistended. BS +. No masses. EXTREMITIES: Without any cyanosis, clubbing, rash, lesions or edema. NEUROLOGIC: AOx3, no focal motor deficit. SKIN: no jaundice, no rashes  Imaging/Labs: as above  I personally reviewed and interpreted the available labs, imaging and endoscopic files.  Impression and Plan: Desiree Hogan is a 68 y.o. female with pmh of Crohn's disease, anemia, COPD, DM, fibromyalgia, HLD, Hypothyroidism, IBS, PVD,  who presents for follow up of Crohn's disease.  The patient had presented issues with  uncontrolled Crohn's disease for multiple years.  Most recently she has been on Entyvio  and has presented a dramatic clinical response as she is completely asymptomatic.  She had a calprotectin checked this year that showed normalization of the levels of this test.  We discussed that this is reassuring and it is likely she is a responder to Entyvio  for her disease.  Will need to assess this endoscopically with a repeat colonoscopy which will be scheduled for next week.  Will obtain surveillance labs today.  In terms of her preventative measures, I encouraged the patient to obtain flu, pneumonia and shingles vaccination.  Will refer her to dermatology for annual cancer screening.  -Schedule colonoscopy - Check CBC, CMP, CRP and vitamin D  levels -Continue Entyvio  every 8 weeks -Please obtain flu, pneumonia and shingles vaccination -Dermatology referral   All questions were answered.      Toribio Fortune, MD Gastroenterology and Hepatology [facility] Gastroenterology     [1]  Social History Tobacco Use  Smoking Status Former   Current packs/day: 0.00   Average packs/day: 0.8 packs/day for 20.0 years (15.0 ttl pk-yrs)   Types: Cigarettes   Start date: 01/03/1989   Quit date: 01/03/2009    Years since quitting: 14.9   Passive exposure: Never  Smokeless Tobacco Never  [2]  Allergies Allergen Reactions   Bactrim [Sulfamethoxazole-Trimethoprim] Itching    And sores   Penicillins Anaphylaxis    Given keflex  on 01/17/2023 Immediate rash, facial/tongue/throat swelling, SOB or lightheadedness with hypotension Severe rash involving mucus membranes or skin necrosis    Stelara  [Ustekinumab ] Nausea Only and Rash    Sweating, flushing,  possible anaphylaxis   Tetracyclines & Related Swelling and Rash   Ciprofloxacin      Blisters and facial swelling

## 2023-12-22 ENCOUNTER — Encounter: Attending: Registered Nurse | Admitting: Registered Nurse

## 2023-12-22 ENCOUNTER — Ambulatory Visit (INDEPENDENT_AMBULATORY_CARE_PROVIDER_SITE_OTHER): Payer: Self-pay | Admitting: Gastroenterology

## 2023-12-22 ENCOUNTER — Encounter: Payer: Self-pay | Admitting: Registered Nurse

## 2023-12-22 ENCOUNTER — Encounter (HOSPITAL_COMMUNITY)
Admission: RE | Admit: 2023-12-22 | Discharge: 2023-12-22 | Disposition: A | Discharge status: Home / Self Care | Source: Ambulatory Visit | Attending: Gastroenterology | Admitting: Gastroenterology

## 2023-12-22 ENCOUNTER — Other Ambulatory Visit: Payer: Self-pay | Admitting: Rheumatology

## 2023-12-22 VITALS — BP 131/79 | HR 94 | Ht 63.0 in | Wt 190.6 lb

## 2023-12-22 DIAGNOSIS — G894 Chronic pain syndrome: Secondary | ICD-10-CM | POA: Insufficient documentation

## 2023-12-22 DIAGNOSIS — M1712 Unilateral primary osteoarthritis, left knee: Secondary | ICD-10-CM | POA: Insufficient documentation

## 2023-12-22 DIAGNOSIS — Z5181 Encounter for therapeutic drug level monitoring: Secondary | ICD-10-CM | POA: Diagnosis not present

## 2023-12-22 DIAGNOSIS — M545 Low back pain, unspecified: Secondary | ICD-10-CM | POA: Insufficient documentation

## 2023-12-22 DIAGNOSIS — M25512 Pain in left shoulder: Secondary | ICD-10-CM | POA: Insufficient documentation

## 2023-12-22 DIAGNOSIS — G8929 Other chronic pain: Secondary | ICD-10-CM | POA: Insufficient documentation

## 2023-12-22 DIAGNOSIS — Z79891 Long term (current) use of opiate analgesic: Secondary | ICD-10-CM | POA: Diagnosis not present

## 2023-12-22 LAB — COMPREHENSIVE METABOLIC PANEL WITH GFR
AG Ratio: 1.7 (calc) (ref 1.0–2.5)
ALT: 17 U/L (ref 6–29)
AST: 29 U/L (ref 10–35)
Albumin: 4.6 g/dL (ref 3.6–5.1)
Alkaline phosphatase (APISO): 72 U/L (ref 37–153)
BUN: 19 mg/dL (ref 7–25)
CO2: 27 mmol/L (ref 20–32)
Calcium: 9.8 mg/dL (ref 8.6–10.4)
Chloride: 102 mmol/L (ref 98–110)
Creat: 0.85 mg/dL (ref 0.50–1.05)
Globulin: 2.7 g/dL (ref 1.9–3.7)
Glucose, Bld: 147 mg/dL — ABNORMAL HIGH (ref 65–99)
Potassium: 4.7 mmol/L (ref 3.5–5.3)
Sodium: 138 mmol/L (ref 135–146)
Total Bilirubin: 0.4 mg/dL (ref 0.2–1.2)
Total Protein: 7.3 g/dL (ref 6.1–8.1)
eGFR: 75 mL/min/1.73m2

## 2023-12-22 LAB — C-REACTIVE PROTEIN: CRP: <3 mg/L

## 2023-12-22 LAB — CBC WITH DIFFERENTIAL/PLATELET
Absolute Lymphocytes: 1122 {cells}/uL (ref 850–3900)
Absolute Monocytes: 1018 {cells}/uL — ABNORMAL HIGH (ref 200–950)
Basophils Absolute: 26 {cells}/uL (ref 0–200)
Basophils Relative: 0.3 %
Eosinophils Absolute: 9 {cells}/uL — ABNORMAL LOW (ref 15–500)
Eosinophils Relative: 0.1 %
HCT: 36.5 % (ref 35.9–46.0)
Hemoglobin: 12 g/dL (ref 11.7–15.5)
MCH: 28.2 pg (ref 27.0–33.0)
MCHC: 32.9 g/dL (ref 31.6–35.4)
MCV: 85.7 fL (ref 81.4–101.7)
MPV: 13.3 fL — ABNORMAL HIGH (ref 7.5–12.5)
Monocytes Relative: 11.7 %
Neutro Abs: 6525 {cells}/uL (ref 1500–7800)
Neutrophils Relative %: 75 %
Platelets: 223 Thousand/uL (ref 140–400)
RBC: 4.26 Million/uL (ref 3.80–5.10)
RDW: 12.9 % (ref 11.0–15.0)
Total Lymphocyte: 12.9 %
WBC: 8.7 Thousand/uL (ref 3.8–10.8)

## 2023-12-22 LAB — VITAMIN D 25 HYDROXY (VIT D DEFICIENCY, FRACTURES): Vit D, 25-Hydroxy: 42 ng/mL (ref 30–100)

## 2023-12-22 MED ORDER — TRAMADOL HCL 50 MG PO TABS: 50.0000 mg | ORAL_TABLET | Freq: Three times a day (TID) | ORAL | 0 refills | Status: AC | PRN

## 2023-12-22 NOTE — Pre-Procedure Instructions (Signed)
 Attempted pre-op phonecall. Left VM for her to call us  back.

## 2023-12-22 NOTE — Progress Notes (Signed)
 "  Subjective:    Patient ID: Desiree Hogan, female    DOB: 09/02/1955, 68 y.o.   MRN: [id number]  HPI: Desiree Hogan is a 67 y.o. female who returns for follow up appointment for chronic pain and medication refill. She states her pain is located in her left shoulder., lower back and left knee pain. She rates her  pain 6. Her current exercise regime is walking and performing stretching exercises.  Desiree Hogan Morphine  equivalent is 30.00 MME.   Oral Swab was Performed today.      Pain Inventory Average Pain 6 Pain Right Now 6 My pain is aching  In the last 24 hours, has pain interfered with the following? General activity 1 Relation with others 0 Enjoyment of life 0 What TIME of day is your pain at its worst? evening Sleep (in general) Fair  Pain is worse with: walking, sitting, and some activites Pain improves with: medication Relief from Meds: 4  Family History  Problem Relation Age of Onset   Cancer Mother        Colon or vaginal ?   Deep vein thrombosis Mother    Hypertension Father    Diabetes Father    Heart disease Father        Heart Disease before age 2   Diabetes Sister    Diabetes Brother    Hypertension Biomedical Engineer    Healthy Daughter    Healthy Son    Breast cancer Neg Hx    Social History   Socioeconomic History   Marital status: Married    Spouse name: Not on file   Number of children: Not on file   Years of education: Not on file   Highest education level: Not on file  Occupational History   Not on file  Tobacco Use   Smoking status: Former    Current packs/day: 0.00    Average packs/day: 0.8 packs/day for 20.0 years (15.0 ttl pk-yrs)    Types: Cigarettes    Start date: 01/03/1989    Quit date: 01/03/2009    Years since quitting: 14.9    Passive exposure: Never   Smokeless tobacco: Never  Vaping Use   Vaping status: Never Used  Substance and Sexual Activity   Alcohol  use: No   Drug use: No   Sexual  activity: Not on file  Other Topics Concern   Not on file  Social History Narrative   Not on file   Social Drivers of Health   Tobacco Use: Medium Risk (12/22/2023)   Patient History    Smoking Tobacco Use: Former    Smokeless Tobacco Use: Never    Passive Exposure: Never  Programmer, Applications: Not on file  Food Insecurity: No Food Insecurity (07/19/2023)   Epic    Worried About Programme Researcher, Broadcasting/film/video in the Last Year: Never true    Ran Out of Food in the Last Year: Never true  Transportation Needs: No Transportation Needs (07/19/2023)   Epic    Lack of Transportation (Medical): No    Lack of Transportation (Non-Medical): No  Physical Activity: Not on file  Stress: Not on file  Social Connections: Moderately Isolated (07/19/2023)   Social Connection and Isolation Panel    Frequency of Communication with Friends and Family: More than three times a week    Frequency of Social Gatherings with Friends and Family: More than three times a week    Attends  Religious Services: Never    Active Member of Clubs or Organizations: No    Attends Banker Meetings: Never    Marital Status: Married  Depression (PHQ2-9): Low Risk (12/20/2023)   Depression (PHQ2-9)    PHQ-2 Score: 0  Alcohol  Screen: Not on file  Housing: Unknown (07/19/2023)   Epic    Unable to Pay for Housing in the Last Year: No    Number of Times Moved in the Last Year: Not on file    Homeless in the Last Year: No  Utilities: Not At Risk (07/19/2023)   Epic    Threatened with loss of utilities: No  Health Literacy: Not on file   Past Surgical History:  Procedure Laterality Date   BIOPSY  12/20/2017   Procedure: BIOPSY;  Surgeon: Golda Claudis PENNER, MD;  [facility];  Service: Endoscopy;;  cecal erosions   BIOPSY  11/11/2022   Procedure: BIOPSY;  Surgeon: Eartha Angelia Sieving, MD;  [facility];  Service: Gastroenterology;;   CESAREAN SECTION  06/24/82   CHOLECYSTECTOMY  1993    Gall Bladder   COLONOSCOPY  Jan. 31, 2014   COLONOSCOPY N/A 12/20/2017   Procedure: COLONOSCOPY;  Surgeon: Golda Claudis PENNER, MD;  [facility];  Service: Endoscopy;  Laterality: N/A;  2:25   COLONOSCOPY WITH PROPOFOL  N/A 11/11/2022   Procedure: COLONOSCOPY WITH PROPOFOL ;  Surgeon: Eartha Angelia Sieving, MD;  [facility];  Service: Gastroenterology;  Laterality: N/A;  9:30AM;ASA 3   POLYPECTOMY  11/11/2022   Procedure: POLYPECTOMY;  Surgeon: Eartha Angelia Sieving, MD;  [facility];  Service: Gastroenterology;;   [facility] SURGERY  2010 and 2011   TONSILLECTOMY     Past Surgical History:  Procedure Laterality Date   BIOPSY  12/20/2017   Procedure: BIOPSY;  Surgeon: Golda Claudis PENNER, MD;  [facility];  Service: Endoscopy;;  cecal erosions   BIOPSY  11/11/2022   Procedure: BIOPSY;  Surgeon: Eartha Angelia Sieving, MD;  [facility];  Service: Gastroenterology;;   CESAREAN SECTION  06/24/82   CHOLECYSTECTOMY  1993   Gall Bladder   COLONOSCOPY  Jan. 31, 2014   COLONOSCOPY N/A 12/20/2017   Procedure: COLONOSCOPY;  Surgeon: Golda Claudis PENNER, MD;  [facility];  Service: Endoscopy;  Laterality: N/A;  2:25   COLONOSCOPY WITH PROPOFOL  N/A 11/11/2022   Procedure: COLONOSCOPY WITH PROPOFOL ;  Surgeon: Eartha Angelia Sieving, MD;  [facility];  Service: Gastroenterology;  Laterality: N/A;  9:30AM;ASA 3   POLYPECTOMY  11/11/2022   Procedure: POLYPECTOMY;  Surgeon: Eartha Angelia, Sieving, MD;  [facility];  Service: Gastroenterology;;   SPINE SURGERY  2010 and 2011   TONSILLECTOMY     Past Medical History:  Diagnosis Date   Abdominal pain    Anemia    Back pain    Colitis    COPD (chronic obstructive pulmonary disease) (HCC)    not on home o2   Crohn's disease (HCC) 03/29/12   Diabetes mellitus without complication (HCC)    Fibromyalgia    H/O breast biopsy    Hip pain    Hyperlipidemia     Hypothyroidism    Irritable bowel syndrome    Joint pain    Nonspecific abnormal finding in stool contents    Peripheral vascular disease    Ulcer    Mouth   BP (!) 153/79   Pulse 94   Ht 5' 3 (1.6 m)  Wt 190 lb 9.6 oz (86.5 kg)   SpO2 91%   BMI 33.76 kg/m   Opioid Risk Score:   Fall Risk Score:  `1  Depression screen PHQ 2/9     12/22/2023    1:08 PM 12/20/2023   12:55 PM 10/25/2023   12:46 PM 07/05/2023   12:53 PM 06/14/2023   10:58 AM 05/10/2023   12:53 PM 03/15/2023   12:56 PM  Depression screen PHQ 2/9  Decreased Interest 0 0 0 0 0 0 0  Down, Depressed, Hopeless 0 0 0 0 0 0 0  PHQ - 2 Score 0 0 0 0 0 0 0    Review of Systems  Musculoskeletal:  Positive for arthralgias.       Bil  knee left wrist and left shoulder  All other systems reviewed and are negative.      Objective:   Physical Exam Vitals and nursing note reviewed.  Constitutional:      Appearance: Normal appearance.  Cardiovascular:     Rate and Rhythm: Normal rate and regular rhythm.     Pulses: Normal pulses.     Heart sounds: Normal heart sounds.  Pulmonary:     Effort: Pulmonary effort is normal.     Breath sounds: Normal breath sounds.  Musculoskeletal:     Comments: Normal Muscle Bulk and Muscle Testing Reveals:  Upper Extremities: Right: Full ROM and Muscle Strength 5/5 Left Upper Extremity: Decreased ROM 45 Degrees and Muscle Strength 5/5 Lumbar Paraspinal Tenderness: L-4-L-5 Lower Extremities: Full ROM and Muscle Strength 5/5 Arises from Table slowly Narrow Based Gait     Skin:    General: Skin is warm and dry.  Neurological:     Mental Status: She is alert and oriented to person, place, and time.  Psychiatric:        Mood and Affect: Mood normal.        Behavior: Behavior normal.          Assessment & Plan:  Left  Shoulder Pain:  Continue HEP as Tolerated. Continue current medication regimen. Continue to Monitor. 12/19//2025 Lower Back Pain without Sciatica: Continue  HEP as Tolerated. Continue current medication regimen. Continue to Monitor. 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 Continue HEP as Tolerated. Continue Current Medication Regimen. Continue to Monitor. 12/22/2023 Fibromyalgia: Continue HEP as Tolerated. Continue Lyrica .  Continue to Monitor. 12/22/2023 Chronic Pain Syndrome: Continue  Tramadol  50 mg three times a day as needed for pain #90. We will continue the opioid monitoring program, this consists of regular clinic visits, examinations, urine drug screen, pill counts as well as use of [place]  Controlled Substance Reporting system. A 12 month History has been reviewed on the [place]  Controlled Substance Reporting System on 12/22/2023.   F/U in 6 months   "

## 2023-12-22 NOTE — Telephone Encounter (Signed)
 Last Fill: 09/27/2023  Next Visit: 01/12/2024  Last Visit: 07/06/2023  Dx: Fibromyalgia   Current Dose per office note on 07/06/2023: not discussed  Okay to refill Cymbalta ?

## 2023-12-25 NOTE — Pre-Procedure Instructions (Signed)
Attempted pre-op phone call> left VM for her to call us back.

## 2023-12-26 ENCOUNTER — Ambulatory Visit (HOSPITAL_COMMUNITY): Payer: Self-pay | Admitting: Anesthesiology

## 2023-12-26 ENCOUNTER — Encounter (INDEPENDENT_AMBULATORY_CARE_PROVIDER_SITE_OTHER): Payer: Self-pay | Admitting: *Deleted

## 2023-12-26 ENCOUNTER — Encounter (HOSPITAL_COMMUNITY): Payer: Self-pay | Admitting: Anesthesiology

## 2023-12-26 ENCOUNTER — Ambulatory Visit (HOSPITAL_COMMUNITY)
Admission: RE | Admit: 2023-12-26 | Discharge: 2023-12-26 | Disposition: A | Discharge status: Home / Self Care | Attending: Gastroenterology | Admitting: Gastroenterology

## 2023-12-26 ENCOUNTER — Encounter (HOSPITAL_COMMUNITY): Admission: RE | Payer: Self-pay | Source: Home / Self Care

## 2023-12-26 DIAGNOSIS — K6389 Other specified diseases of intestine: Secondary | ICD-10-CM | POA: Insufficient documentation

## 2023-12-26 DIAGNOSIS — E039 Hypothyroidism, unspecified: Secondary | ICD-10-CM | POA: Insufficient documentation

## 2023-12-26 DIAGNOSIS — K589 Irritable bowel syndrome without diarrhea: Secondary | ICD-10-CM | POA: Insufficient documentation

## 2023-12-26 DIAGNOSIS — J449 Chronic obstructive pulmonary disease, unspecified: Secondary | ICD-10-CM | POA: Diagnosis not present

## 2023-12-26 DIAGNOSIS — D122 Benign neoplasm of ascending colon: Secondary | ICD-10-CM

## 2023-12-26 DIAGNOSIS — K5080 Crohn's disease of both small and large intestine without complications: Secondary | ICD-10-CM | POA: Diagnosis present

## 2023-12-26 DIAGNOSIS — M797 Fibromyalgia: Secondary | ICD-10-CM | POA: Insufficient documentation

## 2023-12-26 DIAGNOSIS — I10 Essential (primary) hypertension: Secondary | ICD-10-CM | POA: Insufficient documentation

## 2023-12-26 DIAGNOSIS — K635 Polyp of colon: Secondary | ICD-10-CM | POA: Diagnosis not present

## 2023-12-26 DIAGNOSIS — E785 Hyperlipidemia, unspecified: Secondary | ICD-10-CM | POA: Diagnosis not present

## 2023-12-26 DIAGNOSIS — E1151 Type 2 diabetes mellitus with diabetic peripheral angiopathy without gangrene: Secondary | ICD-10-CM | POA: Insufficient documentation

## 2023-12-26 DIAGNOSIS — Z79620 Long term (current) use of immunosuppressive biologic: Secondary | ICD-10-CM | POA: Diagnosis not present

## 2023-12-26 DIAGNOSIS — Z87891 Personal history of nicotine dependence: Secondary | ICD-10-CM

## 2023-12-26 HISTORY — PX: POLYPECTOMY: SHX149

## 2023-12-26 HISTORY — PX: COLONOSCOPY: SHX5424

## 2023-12-26 LAB — DRUG TOX MONITOR 1 W/CONF, ORAL FLD
Amphetamines: NEGATIVE ng/mL
Barbiturates: NEGATIVE ng/mL
Benzodiazepines: NEGATIVE ng/mL
Buprenorphine: NEGATIVE ng/mL
Cocaine: NEGATIVE ng/mL
Fentanyl: NEGATIVE ng/mL
Heroin Metabolite: NEGATIVE ng/mL
MARIJUANA: NEGATIVE ng/mL
MDMA: NEGATIVE ng/mL
Meprobamate: NEGATIVE ng/mL
Methadone: NEGATIVE ng/mL
Nicotine Metabolite: NEGATIVE ng/mL
Opiates: NEGATIVE ng/mL
Phencyclidine: NEGATIVE ng/mL
Tapentadol: NEGATIVE ng/mL
Tramadol: >500 ng/mL — ABNORMAL HIGH
Tramadol: POSITIVE ng/mL — AB
Zolpidem: NEGATIVE ng/mL

## 2023-12-26 LAB — DRUG TOX ALC METAB W/CON, ORAL FLD: Alcohol Metabolite: NEGATIVE ng/mL

## 2023-12-26 LAB — COLOGUARD: Cologuard: POSITIVE — AB

## 2023-12-26 SURGERY — COLONOSCOPY
Anesthesia: Monitor Anesthesia Care

## 2023-12-26 MED ORDER — PROPOFOL 10 MG/ML IV BOLUS
INTRAVENOUS | Status: DC | PRN
  Administered 2023-12-26: 100 mg via INTRAVENOUS

## 2023-12-26 MED ORDER — LIDOCAINE 2% (20 MG/ML) 5 ML SYRINGE
INTRAMUSCULAR | Status: DC | PRN
  Administered 2023-12-26: 80 mg via INTRAVENOUS

## 2023-12-26 MED ORDER — LACTATED RINGERS IV SOLN: INTRAVENOUS | Status: DC

## 2023-12-26 MED ORDER — PROPOFOL 500 MG/50ML IV EMUL
INTRAVENOUS | Status: DC | PRN
  Administered 2023-12-26: 150 ug/kg/min via INTRAVENOUS

## 2023-12-26 NOTE — Transfer of Care (Addendum)
 Immediate Anesthesia Transfer of Care Note  Patient: Desiree Hogan  Procedure(s) Performed: COLONOSCOPY  Patient Location: Short Stay  Anesthesia Type:MAC  Level of Consciousness: awake, alert , oriented, and patient cooperative  Airway & Oxygen Therapy: Patient Spontanous Breathing  Post-op Assessment: Report given to RN, Post -op Vital signs reviewed and stable, and Patient moving all extremities X 4  Post vital signs: Reviewed and stable  Last Vitals:  Vitals Value Taken Time  BP    Temp    Pulse    Resp    SpO2      Last Pain:  Vitals:   12/26/23 1005  TempSrc: Oral  PainSc: 0-No pain      Patients Stated Pain Goal: 6 (12/26/23 1005)  Complications: No notable events documented.

## 2023-12-26 NOTE — Interval H&P Note (Signed)
 History and Physical Interval Note:  12/26/2023 9:40 AM  Desiree Hogan  has presented today for surgery, with the diagnosis of crohn's disease.  The various methods of treatment have been discussed with the patient and family. After consideration of risks, benefits and other options for treatment, the patient has consented to  Procedures with comments: COLONOSCOPY (N/A) - 10:15am, ASA 1-2 as a surgical intervention.  The patient's history has been reviewed, patient examined, no change in status, stable for surgery.  I have reviewed the patient's chart and labs.  Questions were answered to the patient's satisfaction.     Torence Palmeri Castaneda Mayorga

## 2023-12-26 NOTE — Op Note (Signed)
 [facility] Patient Name: Desiree Hogan Procedure Date: 12/26/2023 10:32 AM MRN: [id number] Date of Birth: 1955/10/24 Attending MD: Toribio Fortune , , [id number] CSN: [id number] Age: 68 Admit Type: Outpatient Procedure:                Colonoscopy Indications:              Assess therapeutic response to therapy of Crohn's                            disease of the colon Providers:                Toribio Fortune, Madelin Hunter, RN, Chad Wilson,                            Technician Referring MD:             Toribio Fortune Medicines:                Monitored Anesthesia Care Complications:            No immediate complications. Estimated Blood Loss:     Estimated blood loss: none. Procedure:                Pre-Anesthesia Assessment:                           - Prior to the procedure, a History and Physical                            was performed, and patient medications, allergies                            and sensitivities were reviewed. The patient's                            tolerance of previous anesthesia was reviewed.                           - The risks and benefits of the procedure and the                            sedation options and risks were discussed with the                            patient. All questions were answered and informed                            consent was obtained.                           - ASA Grade Assessment: III - A patient with severe                            systemic disease.                           After obtaining informed consent, the colonoscope  was passed under direct vision. Throughout the                            procedure, the patient's blood pressure, pulse, and                            oxygen saturations were monitored continuously. The                            [id number]-[id number] ([id number]) Peds Colon was introduced                            through the anus and advanced to the the terminal                             ileum. The colonoscopy was performed without                            difficulty. The patient tolerated the procedure                            well. The quality of the bowel preparation was                            excellent. Scope In: 10:48:20 AM Scope Out: 11:09:03 AM Scope Withdrawal Time: 0 hours 9 minutes 56 seconds  Total Procedure Duration: 0 hours 20 minutes 43 seconds  Findings:      The perianal and digital rectal examinations were normal.      The terminal ileum appeared normal. Biopsies were taken with a cold       forceps for histology.      The Simple Endoscopic Score for Crohn's Disease was determined based on       the endoscopic appearance of the mucosa in the following segments:      - Ileum: Findings include no ulcers present, no ulcerated surfaces, no       affected surfaces and no narrowings. Segment score: 0.      - Right Colon: Findings include no ulcers present, no ulcerated       surfaces, no affected surfaces and no narrowings. Segment score: 0.      - Transverse Colon: Findings include no ulcers present, no ulcerated       surfaces, no affected surfaces and no narrowings. Segment score: 0.      - Left Colon: Findings include no ulcers present, no ulcerated surfaces,       no affected surfaces and no narrowings. Segment score: 0.      - Rectum: Findings include no ulcers present, no ulcerated surfaces, no       affected surfaces and no narrowings. Segment score: 0.      - Total SES-CD aggregate score: 0. Biopsies were taken with a cold       forceps for histology fromt he ascending colon.      A 4 mm polyp was found in the ascending colon. The polyp was sessile.       The polyp was removed with a cold snare. Resection and retrieval were       complete.  A medium scar was found in the transverse colon. The scar tissue was       healthy in appearance. Biopsies were taken with a cold forceps for       histology.      The retroflexed  view of the distal rectum and anal verge was normal and       showed no anal or rectal abnormalities. Impression:               - The examined portion of the ileum was normal.                            Biopsied.                           - Simple Endoscopic Score for Crohn's Disease: 0,                            mucosal inflammatory changes secondary to Crohn's                            disease, in remission. Biopsied.                           - One 4 mm polyp in the ascending colon, removed                            with a cold snare. Resected and retrieved.                           - Scar in the transverse colon. Biopsied.                           - The distal rectum and anal verge are normal on                            retroflexion view. Moderate Sedation:      Per Anesthesia Care Recommendation:           - Discharge patient to home (ambulatory).                           - Resume previous diet.                           - Await pathology results.                           - Repeat colonoscopy in 2 years for surveillance.                           - Continue Entyvio  every 8 weeks. Procedure Code(s):        --- Professional ---                           [phone], Colonoscopy, flexible; with removal of  tumor(s), polyp(s), or other lesion(s) by snare                            technique                           [id number], 59, Colonoscopy, flexible; with biopsy,                            single or multiple Diagnosis Code(s):        --- Professional ---                           D12.2, Benign neoplasm of ascending colon                           K63.89, Other specified diseases of intestine                           K50.10, Crohn's disease of large intestine without                            complications CPT copyright 2022 American Medical Association. All rights reserved. The codes documented in this report are preliminary and upon coder review may  be  revised to meet current compliance requirements. Toribio Fortune, MD Toribio Fortune,  12/26/2023 11:15:24 AM This report has been signed electronically. Number of Addenda: 0

## 2023-12-26 NOTE — Progress Notes (Signed)
 Patient supposed to arrive at 0830 for her colonoscopy.  Called patient at home number, she states office told her 29.  Informed patient that was her procedure time and we ask for arrival to be 2 hours before the procedure.  Informed patient a preop call was attempted to give her the proper instructions, voicemails were left but we never heard from her.  Asked patient to come on to the hospital.  Patient voiced understanding, states she's about 30 minutes away in [place]. En route now.

## 2023-12-26 NOTE — Anesthesia Preprocedure Evaluation (Signed)
"                                    Anesthesia Evaluation  Patient identified by MRN, date of birth, ID band Patient awake    Reviewed: Allergy & Precautions, H&P , NPO status , Patient's Chart, lab work & pertinent test results, reviewed documented beta blocker date and time   Airway Mallampati: II  TM Distance: >3 FB Neck ROM: full    Dental no notable dental hx.    Pulmonary pneumonia, COPD, former smoker   Pulmonary exam normal breath sounds clear to auscultation       Cardiovascular Exercise Tolerance: Good hypertension, + Peripheral Vascular Disease   Rhythm:regular Rate:Normal     Neuro/Psych  Neuromuscular disease  negative psych ROS   GI/Hepatic negative GI ROS, Neg liver ROS,,,  Endo/Other  diabetesHypothyroidism    Renal/GU Renal disease  negative genitourinary   Musculoskeletal   Abdominal   Peds  Hematology  (+) Blood dyscrasia, anemia   Anesthesia Other Findings   Reproductive/Obstetrics negative OB ROS                              Anesthesia Physical Anesthesia Plan  ASA: 3  Anesthesia Plan: MAC   Post-op Pain Management:    Induction:   PONV Risk Score and Plan: Propofol  infusion  Airway Management Planned:   Additional Equipment:   Intra-op Plan:   Post-operative Plan:   Informed Consent: I have reviewed the patients History and Physical, chart, labs and discussed the procedure including the risks, benefits and alternatives for the proposed anesthesia with the patient or authorized representative who has indicated his/her understanding and acceptance.     Dental Advisory Given  Plan Discussed with: CRNA  Anesthesia Plan Comments:         Anesthesia Quick Evaluation  "

## 2023-12-26 NOTE — Discharge Instructions (Signed)
 You are being discharged to home.  Resume your previous diet.  We are waiting for your pathology results.  Your physician has recommended a repeat colonoscopy in two years for surveillance.  Continue Entyvio  every 8 weeks.

## 2023-12-27 LAB — SURGICAL PATHOLOGY

## 2023-12-29 ENCOUNTER — Ambulatory Visit (INDEPENDENT_AMBULATORY_CARE_PROVIDER_SITE_OTHER): Payer: Self-pay | Admitting: Gastroenterology

## 2023-12-29 ENCOUNTER — Other Ambulatory Visit: Payer: Self-pay | Admitting: Internal Medicine

## 2023-12-29 DIAGNOSIS — Z1231 Encounter for screening mammogram for malignant neoplasm of breast: Secondary | ICD-10-CM

## 2023-12-29 NOTE — Anesthesia Postprocedure Evaluation (Signed)
"   Anesthesia Post Note  Patient: Desiree Hogan  Procedure(s) Performed: COLONOSCOPY POLYPECTOMY, INTESTINE  Patient location during evaluation: Phase II Anesthesia Type: MAC Level of consciousness: awake Pain management: pain level controlled Vital Signs Assessment: post-procedure vital signs reviewed and stable Respiratory status: spontaneous breathing and respiratory function stable Cardiovascular status: blood pressure returned to baseline and stable Postop Assessment: no headache and no apparent nausea or vomiting Anesthetic complications: no Comments: Late entry   No notable events documented.   Last Vitals:  Vitals:   12/26/23 1115 12/26/23 1126  BP: (!) 97/53 (!) 123/59  Pulse: 88   Resp: (!) 25   Temp: 36.7 C   SpO2: 96%     Last Pain:  Vitals:   12/26/23 1115  TempSrc: Oral  PainSc: 0-No pain                 Yvonna PARAS Daniella Dewberry      "

## 2024-01-01 NOTE — Progress Notes (Signed)
 "  Office Visit Note  Patient: Desiree Hogan             Date of Birth: Mar 29, 1955           MRN: [id number]             PCP: Rosamond Leta NOVAK, MD Referring: Rosamond Leta NOVAK, MD Visit Date: 01/12/2024 Occupation: Data Unavailable  Subjective:  Pain in multiple joints  History of Present Illness: Desiree Hogan is a 68 y.o. female with fibromyalgia and osteoarthritis.  She returns today after her last visit in July 2025.  She states she has been having increased pain and discomfort all over.  She describes pain and discomfort in her bilateral trapezius region.  She states that joint pain has improved after the cortisone injection given in July.  She also has discomfort in her lower extremities.  She continues to have some discomfort in her knee joints but is manageable.  Lower back pain comes and goes.  She has been taking Fosamax 70 mg p.o. weekly for osteoporosis.    Activities of Daily Living:  Patient reports morning stiffness for 30-40 minutes.   Patient Reports nocturnal pain.  Difficulty dressing/grooming: Denies Difficulty climbing stairs: Denies Difficulty getting out of chair: Denies Difficulty using hands for taps, buttons, cutlery, and/or writing: Denies  Review of Systems  Constitutional:  Positive for fatigue.  HENT:  Negative for mouth sores and mouth dryness.   Eyes:  Negative for dryness.  Respiratory:  Negative for shortness of breath.   Cardiovascular:  Negative for chest pain and palpitations.  Gastrointestinal:  Negative for blood in stool, constipation and diarrhea.  Endocrine: Negative for increased urination.  Genitourinary:  Negative for involuntary urination.  Musculoskeletal:  Positive for joint pain, joint pain, joint swelling, myalgias, muscle weakness, morning stiffness, muscle tenderness and myalgias. Negative for gait problem.  Skin:  Negative for color change, rash, hair loss and sensitivity to sunlight.  Allergic/Immunologic: Positive for susceptible to  infections.  Neurological:  Negative for dizziness and headaches.  Hematological:  Negative for swollen glands.  Psychiatric/Behavioral:  Positive for sleep disturbance. Negative for depressed mood. The patient is not nervous/anxious.     PMFS History:  Patient Active Problem List   Diagnosis Date Noted   AKI (acute kidney injury) 07/19/2023   Crohn's disease of both small and large intestine (HCC) 01/05/2023   Elevated fecal calprotectin 12/06/2021   Nausea without vomiting 10/20/2020   Abdominal pain 07/17/2020   Diarrhea 06/23/2020   Diverticulitis of colon 10/11/2017   Fibromyalgia 07/14/2017   ANA positive 07/14/2017   Primary osteoarthritis of both knees 07/14/2017   DDD (degenerative disc disease), lumbar 07/14/2017   Hypermobility of joint 07/14/2017   Age-related osteoporosis without current pathological fracture 07/14/2017   Other insomnia 07/14/2017   History of Crohn's disease 07/14/2017   History of IBS 07/14/2017   History of hypercholesterolemia 07/14/2017   History of COPD 07/14/2017   History of cholecystectomy 07/14/2017   Community acquired pneumonia 12/13/2016   Pneumonia 12/13/2016   Leukocytosis 12/13/2016   Hypokalemia 12/13/2016   Controlled type 2 diabetes mellitus without complication, without long-term current use of insulin  (HCC) 12/13/2016    Past Medical History:  Diagnosis Date   Abdominal pain    Anemia    Back pain    Colitis    COPD (chronic obstructive pulmonary disease) (HCC)    not on home o2   Crohn's disease (HCC) 03/29/12   Diabetes mellitus without complication (  HCC)    Fibromyalgia    H/O breast biopsy    Hip pain    Hyperlipidemia    Hypothyroidism    Irritable bowel syndrome    Joint pain    Nonspecific abnormal finding in stool contents    Peripheral vascular disease    Ulcer    Mouth    Family History  Problem Relation Age of Onset   Cancer Mother        Colon or vaginal ?   Deep vein thrombosis Mother     Hypertension Father    Diabetes Father    Heart disease Father        Heart Disease before age 28   Diabetes Sister    Diabetes Brother    Hypertension Brother    Healthy Daughter    Healthy Daughter    Healthy Daughter    Healthy Son    Breast cancer Neg Hx    Past Surgical History:  Procedure Laterality Date   BIOPSY  12/20/2017   Procedure: BIOPSY;  Surgeon: Golda Claudis PENNER, MD;  Location: [facility];  Service: Endoscopy;;  cecal erosions   BIOPSY  11/11/2022   Procedure: BIOPSY;  Surgeon: Eartha Angelia Sieving, MD;  Location: [facility];  Service: Gastroenterology;;   CESAREAN SECTION  06/24/82   CHOLECYSTECTOMY  1993   Gall Bladder   COLONOSCOPY  Jan. 31, 2014   COLONOSCOPY N/A 12/20/2017   Procedure: COLONOSCOPY;  Surgeon: Golda Claudis PENNER, MD;  Location: [facility];  Service: Endoscopy;  Laterality: N/A;  2:25   COLONOSCOPY N/A 12/26/2023   Procedure: COLONOSCOPY;  Surgeon: Eartha Angelia Sieving, MD;  Location: [facility];  Service: Gastroenterology;  Laterality: N/A;  10:15am, ASA 1-2   COLONOSCOPY WITH PROPOFOL  N/A 11/11/2022   Procedure: COLONOSCOPY WITH PROPOFOL ;  Surgeon: Eartha Angelia Sieving, MD;  Location: [facility];  Service: Gastroenterology;  Laterality: N/A;  9:30AM;ASA 3   POLYPECTOMY  11/11/2022   Procedure: POLYPECTOMY;  Surgeon: Eartha Angelia Sieving, MD;  Location: [facility];  Service: Gastroenterology;;   POLYPECTOMY  12/26/2023   Procedure: POLYPECTOMY, INTESTINE;  Surgeon: Eartha Angelia Sieving, MD;  Location: [facility];  Service: Gastroenterology;;   SPINE SURGERY  2010 and 2011   TONSILLECTOMY     Social History[1] Social History   Social History Narrative   Not on file      There is no immunization history on file for this patient.   Objective: Vital Signs: BP 126/76   Pulse 89   Temp (!) 97.5 F (36.4 C)   Resp 16   Ht 5' 3 (1.6 m)   Wt 188 lb 12.8 oz (85.6 kg)   BMI 33.44 kg/m     Physical Exam Vitals and nursing note reviewed.  Constitutional:      Appearance: She is well-developed.  HENT:     Head: Normocephalic and atraumatic.  Eyes:     Conjunctiva/sclera: Conjunctivae normal.  Cardiovascular:     Rate and Rhythm: Normal rate and regular rhythm.     Heart sounds: Normal heart sounds.  Pulmonary:     Effort: Pulmonary effort is normal.     Breath sounds: Normal breath sounds.  Abdominal:     General: Bowel sounds are normal.     Palpations: Abdomen is soft.  Musculoskeletal:     Cervical back: Normal range of motion.  Lymphadenopathy:     Cervical: No cervical adenopathy.  Skin:    General: Skin is  warm and dry.     Capillary Refill: Capillary refill takes less than 2 seconds.  Neurological:     Mental Status: She is alert and oriented to person, place, and time.  Psychiatric:        Behavior: Behavior normal.      Musculoskeletal Exam: She had limited painful limited range of motion of cervical spine.  Bilateral trapezius spasm was noted.  No tenderness was on the thoracic spine.  She had tenderness over her lumbar spine and SI joints.  Shoulders, elbows, wrist joints, MCPs PIPs and DIPs were in good range of motion with no synovitis.  She had limited range of motion of bilateral hip joints without discomfort.  Knee joints with good range of motion without any warmth swelling or effusion.  There was no tenderness over ankles or MTPs.  CDAI Exam: CDAI Score: -- Patient Global: --; Provider Global: -- Swollen: --; Tender: -- Joint Exam 01/12/2024   No joint exam has been documented for this visit   There is currently no information documented on the homunculus. Go to the Rheumatology activity and complete the homunculus joint exam.  Investigation: No additional findings.  Imaging: MM 3D SCREENING MAMMOGRAM BILATERAL BREAST Result Date: 01/05/2024 CLINICAL DATA:  Screening. EXAM: DIGITAL SCREENING BILATERAL MAMMOGRAM WITH TOMOSYNTHESIS AND  CAD TECHNIQUE: Bilateral screening digital craniocaudal and mediolateral oblique mammograms were obtained. Bilateral screening digital breast tomosynthesis was performed. The images were evaluated with computer-aided detection. COMPARISON:  Previous exam(s). ACR Breast Density Category b: There are scattered areas of fibroglandular density. FINDINGS: There are no findings suspicious for malignancy. IMPRESSION: No mammographic evidence of malignancy. A result letter of this screening mammogram will be mailed directly to the patient. RECOMMENDATION: Screening mammogram in one year. (Code:SM-B-01Y) BI-RADS CATEGORY  1: Negative. Electronically Signed   By: Curtistine Noble   On: 01/05/2024 13:54    Recent Labs: Lab Results  Component Value Date   WBC 8.7 12/21/2023   HGB 12.0 12/21/2023   PLT 223 12/21/2023   NA 138 12/21/2023   K 4.7 12/21/2023   CL 102 12/21/2023   CO2 27 12/21/2023   GLUCOSE 147 (H) 12/21/2023   BUN 19 12/21/2023   CREATININE 0.85 12/21/2023   BILITOT 0.4 12/21/2023   ALKPHOS 94 07/19/2023   AST 29 12/21/2023   ALT 17 12/21/2023   PROT 7.3 12/21/2023   ALBUMIN 2.8 (L) 07/19/2023   CALCIUM  9.8 12/21/2023   GFRAA >60 09/01/2017   QFTBGOLDPLUS NEGATIVE 01/05/2023    Speciality Comments: No specialty comments available.  Procedures:  Trigger Point Inj  Date/Time: 01/12/2024 11:54 AM  Performed by: Dolphus Reiter, MD Authorized by: Dolphus Reiter, MD   Consent Given by:  Patient Site marked: the procedure site was marked   Timeout: prior to procedure the correct patient, procedure, and site was verified   Indications:  Muscle spasm and pain Total # of Trigger Points:  2 Location: neck   Needle Size:  27 G Approach:  Dorsal Medications #1:  0.5 mL lidocaine  1 %; 20 mg triamcinolone  acetonide 40 MG/ML Medications #2:  0.5 mL lidocaine  1 %; 20 mg triamcinolone  acetonide 40 MG/ML Patient tolerance:  Patient tolerated the procedure well with no immediate  complications Comments: Risk of infection, tendon injury, nerve injury, hypopigmentation and dermal atrophy were discussed.  Allergies: Bactrim [sulfamethoxazole-trimethoprim], Penicillins, Stelara  [ustekinumab ], Tetracyclines & related, and Ciprofloxacin    Assessment / Plan:     Visit Diagnoses: Fibromyalgia-she continues to have some generalized pain and discomfort  from fibromyalgia.  She continues to be on Cymbalta  30 mg p.o. daily, tizanidine  4 mg p.o. nightly and Lyrica  50 mg p.o. twice daily which helps.  Need for regular exercise and stretching was discussed.  Trapezius muscle spasm - She had trigger point injections performed on 12/15/2022 which provided significant relief.  She takes tizanidine  4 mg at bedtime as needed for muscle spasms. - Plan: Trigger Point Inj  ANA positive - +RF, -CCP: No clinical features of systemic lupus at this time.  Chronic SI joint pain-she states the pain in the SI joints is better after the cortisone injection.  Primary osteoarthritis of both knees-she is intermittent discomfort in her knee joints.  Degeneration of intervertebral disc of lumbar region without discogenic back pain or lower extremity pain-she can use to have some lower back pain.  She takes tramadol  for pain management.  Hypermobility of joint  Age-related osteoporosis without current pathological fracture - Managed by PCP. She takes Fosamax 70 mg 1 tablet by mouth once weekly for management of osteoporosis.    Other insomnia  History of hypercholesterolemia  History of COPD  History of Crohn's disease  History of IBS  History of cholecystectomy  Orders: Orders Placed This Encounter  Procedures   Trigger Point Inj   No orders of the defined types were placed in this encounter.    Follow-Up Instructions: Return in about 6 months (around 07/11/2024) for Polyarthralgia, Osteoarthritis.   Maya Nash, MD  Note - This record has been created using Animal nutritionist.   Chart creation errors have been sought, but may not always  have been located. Such creation errors do not reflect on  the standard of medical care.     [1]  Social History Tobacco Use   Smoking status: Former    Current packs/day: 0.00    Average packs/day: 0.8 packs/day for 20.0 years (15.0 ttl pk-yrs)    Types: Cigarettes    Start date: 01/03/1989    Quit date: 01/03/2009    Years since quitting: 15.0    Passive exposure: Never   Smokeless tobacco: Never  Vaping Use   Vaping status: Never Used  Substance Use Topics   Alcohol  use: No   Drug use: No   "

## 2024-01-02 ENCOUNTER — Ambulatory Visit
Admission: RE | Admit: 2024-01-02 | Discharge: 2024-01-02 | Disposition: A | Discharge status: Home / Self Care | Source: Ambulatory Visit | Attending: Internal Medicine | Admitting: Internal Medicine

## 2024-01-02 ENCOUNTER — Encounter (HOSPITAL_COMMUNITY): Payer: Self-pay | Admitting: Gastroenterology

## 2024-01-02 DIAGNOSIS — Z1231 Encounter for screening mammogram for malignant neoplasm of breast: Secondary | ICD-10-CM

## 2024-01-03 NOTE — Progress Notes (Signed)
 2 yr TCS noted in recall Patient result letter mailed procedure note and pathology result faxed to PCP

## 2024-01-12 ENCOUNTER — Encounter: Payer: Self-pay | Admitting: Rheumatology

## 2024-01-12 ENCOUNTER — Encounter: Attending: Gastroenterology | Admitting: Rheumatology

## 2024-01-12 ENCOUNTER — Telehealth: Payer: Self-pay | Admitting: Pharmacy Technician

## 2024-01-12 VITALS — BP 126/76 | HR 89 | Temp 97.5°F | Resp 16 | Ht 63.0 in | Wt 188.8 lb

## 2024-01-12 DIAGNOSIS — M249 Joint derangement, unspecified: Secondary | ICD-10-CM | POA: Diagnosis not present

## 2024-01-12 DIAGNOSIS — G8929 Other chronic pain: Secondary | ICD-10-CM | POA: Insufficient documentation

## 2024-01-12 DIAGNOSIS — Z8719 Personal history of other diseases of the digestive system: Secondary | ICD-10-CM | POA: Insufficient documentation

## 2024-01-12 DIAGNOSIS — R7689 Other specified abnormal immunological findings in serum: Secondary | ICD-10-CM | POA: Diagnosis not present

## 2024-01-12 DIAGNOSIS — Z9049 Acquired absence of other specified parts of digestive tract: Secondary | ICD-10-CM | POA: Insufficient documentation

## 2024-01-12 DIAGNOSIS — M62838 Other muscle spasm: Secondary | ICD-10-CM | POA: Insufficient documentation

## 2024-01-12 DIAGNOSIS — G4709 Other insomnia: Secondary | ICD-10-CM | POA: Diagnosis not present

## 2024-01-12 DIAGNOSIS — M810 Age-related osteoporosis without current pathological fracture: Secondary | ICD-10-CM | POA: Insufficient documentation

## 2024-01-12 DIAGNOSIS — M51369 Other intervertebral disc degeneration, lumbar region without mention of lumbar back pain or lower extremity pain: Secondary | ICD-10-CM | POA: Diagnosis not present

## 2024-01-12 DIAGNOSIS — Z8709 Personal history of other diseases of the respiratory system: Secondary | ICD-10-CM | POA: Insufficient documentation

## 2024-01-12 DIAGNOSIS — M533 Sacrococcygeal disorders, not elsewhere classified: Secondary | ICD-10-CM | POA: Diagnosis not present

## 2024-01-12 DIAGNOSIS — Z8639 Personal history of other endocrine, nutritional and metabolic disease: Secondary | ICD-10-CM | POA: Insufficient documentation

## 2024-01-12 DIAGNOSIS — M170 Bilateral primary osteoarthritis of knee: Secondary | ICD-10-CM | POA: Insufficient documentation

## 2024-01-12 DIAGNOSIS — M797 Fibromyalgia: Secondary | ICD-10-CM | POA: Diagnosis not present

## 2024-01-12 MED ORDER — LIDOCAINE HCL 1 % IJ SOLN
0.5000 mL | INTRAMUSCULAR | Status: AC | PRN
  Administered 2024-01-12: .5 mL

## 2024-01-12 MED ORDER — TRIAMCINOLONE ACETONIDE 40 MG/ML IJ SUSP
20.0000 mg | INTRAMUSCULAR | Status: AC | PRN
  Administered 2024-01-12: 20 mg via INTRAMUSCULAR

## 2024-01-12 NOTE — Telephone Encounter (Signed)
 Entyvio  Patient Assistance in process

## 2024-01-23 NOTE — Progress Notes (Signed)
 Desiree Hogan                                          MRN: [id number]   01/23/2024   The [facility] Quality Team Specialist reviewed this patient medical record for the purposes of chart review for care gap closure. The following were reviewed: abstraction for care gap closure-controlling blood pressure.    [facility] Quality Team

## 2024-02-14 ENCOUNTER — Ambulatory Visit

## 2024-06-14 ENCOUNTER — Encounter: Admitting: Registered Nurse

## 2024-07-11 ENCOUNTER — Encounter: Attending: Gastroenterology | Admitting: Rheumatology

## 2024-08-21 ENCOUNTER — Ambulatory Visit: Admitting: Physician Assistant
